# Patient Record
Sex: Female | Born: 1935 | Race: Black or African American | Hispanic: No | Smoking: Never smoker
Health system: Southern US, Community
[De-identification: ages and names within clinical notes are randomized; demographics above are authoritative.]

## PROBLEM LIST (undated history)

## (undated) ENCOUNTER — Emergency Department (HOSPITAL_COMMUNITY): Admission: EM | Payer: Medicare HMO | Source: Home / Self Care

## (undated) DIAGNOSIS — D472 Monoclonal gammopathy: Secondary | ICD-10-CM

## (undated) DIAGNOSIS — E039 Hypothyroidism, unspecified: Secondary | ICD-10-CM

## (undated) DIAGNOSIS — R51 Headache: Secondary | ICD-10-CM

## (undated) DIAGNOSIS — I10 Essential (primary) hypertension: Secondary | ICD-10-CM

## (undated) HISTORY — PX: OTHER SURGICAL HISTORY: SHX169

## (undated) HISTORY — PX: ABDOMINAL HYSTERECTOMY: SHX81

## (undated) HISTORY — DX: Headache: R51

## (undated) HISTORY — DX: Monoclonal gammopathy: D47.2

---

## 1998-08-06 ENCOUNTER — Ambulatory Visit (HOSPITAL_COMMUNITY): Admission: RE | Admit: 1998-08-06 | Discharge: 1998-08-06 | Payer: Self-pay | Admitting: Obstetrics and Gynecology

## 1999-08-13 ENCOUNTER — Encounter: Payer: Self-pay | Admitting: Internal Medicine

## 1999-08-13 ENCOUNTER — Ambulatory Visit (HOSPITAL_COMMUNITY): Admission: RE | Admit: 1999-08-13 | Discharge: 1999-08-13 | Payer: Self-pay | Admitting: Internal Medicine

## 2000-08-16 ENCOUNTER — Encounter: Payer: Self-pay | Admitting: Obstetrics and Gynecology

## 2000-08-16 ENCOUNTER — Ambulatory Visit (HOSPITAL_COMMUNITY): Admission: RE | Admit: 2000-08-16 | Discharge: 2000-08-16 | Payer: Self-pay | Admitting: Obstetrics and Gynecology

## 2000-11-21 ENCOUNTER — Other Ambulatory Visit: Admission: RE | Admit: 2000-11-21 | Discharge: 2000-11-21 | Payer: Self-pay | Admitting: Obstetrics and Gynecology

## 2000-11-21 ENCOUNTER — Encounter: Payer: Self-pay | Admitting: Obstetrics and Gynecology

## 2000-11-21 ENCOUNTER — Encounter: Admission: RE | Admit: 2000-11-21 | Discharge: 2000-11-21 | Payer: Self-pay | Admitting: Obstetrics and Gynecology

## 2001-05-16 ENCOUNTER — Emergency Department (HOSPITAL_COMMUNITY): Admission: EM | Admit: 2001-05-16 | Discharge: 2001-05-16 | Payer: Self-pay | Admitting: Emergency Medicine

## 2002-03-05 ENCOUNTER — Encounter: Admission: RE | Admit: 2002-03-05 | Discharge: 2002-03-05 | Payer: Self-pay | Admitting: Obstetrics and Gynecology

## 2002-03-05 ENCOUNTER — Encounter: Payer: Self-pay | Admitting: Obstetrics and Gynecology

## 2002-07-24 ENCOUNTER — Emergency Department (HOSPITAL_COMMUNITY): Admission: EM | Admit: 2002-07-24 | Discharge: 2002-07-24 | Payer: Self-pay | Admitting: Emergency Medicine

## 2003-02-05 ENCOUNTER — Observation Stay (HOSPITAL_COMMUNITY): Admission: RE | Admit: 2003-02-05 | Discharge: 2003-02-06 | Payer: Self-pay | Admitting: Obstetrics and Gynecology

## 2005-07-06 ENCOUNTER — Ambulatory Visit (HOSPITAL_COMMUNITY): Admission: RE | Admit: 2005-07-06 | Discharge: 2005-07-06 | Payer: Self-pay | Admitting: Internal Medicine

## 2005-08-19 ENCOUNTER — Ambulatory Visit (HOSPITAL_COMMUNITY): Admission: RE | Admit: 2005-08-19 | Discharge: 2005-08-19 | Payer: Self-pay | Admitting: Obstetrics and Gynecology

## 2006-10-13 ENCOUNTER — Ambulatory Visit (HOSPITAL_COMMUNITY): Admission: RE | Admit: 2006-10-13 | Discharge: 2006-10-13 | Payer: Self-pay | Admitting: Internal Medicine

## 2007-01-17 ENCOUNTER — Ambulatory Visit (HOSPITAL_COMMUNITY): Admission: RE | Admit: 2007-01-17 | Discharge: 2007-01-17 | Payer: Self-pay | Admitting: Internal Medicine

## 2007-10-16 ENCOUNTER — Ambulatory Visit (HOSPITAL_COMMUNITY): Admission: RE | Admit: 2007-10-16 | Discharge: 2007-10-16 | Payer: Self-pay | Admitting: Obstetrics and Gynecology

## 2007-10-22 ENCOUNTER — Ambulatory Visit (HOSPITAL_COMMUNITY): Admission: RE | Admit: 2007-10-22 | Discharge: 2007-10-22 | Payer: Self-pay | Admitting: Internal Medicine

## 2008-10-16 ENCOUNTER — Ambulatory Visit (HOSPITAL_COMMUNITY): Admission: RE | Admit: 2008-10-16 | Discharge: 2008-10-16 | Payer: Self-pay | Admitting: Obstetrics and Gynecology

## 2010-04-16 ENCOUNTER — Ambulatory Visit: Payer: Self-pay | Admitting: Internal Medicine

## 2010-04-16 ENCOUNTER — Observation Stay (HOSPITAL_COMMUNITY): Admission: EM | Admit: 2010-04-16 | Discharge: 2010-04-18 | Payer: Self-pay | Admitting: Emergency Medicine

## 2010-04-17 ENCOUNTER — Encounter (INDEPENDENT_AMBULATORY_CARE_PROVIDER_SITE_OTHER): Payer: Self-pay | Admitting: Internal Medicine

## 2010-11-07 ENCOUNTER — Encounter: Payer: Self-pay | Admitting: Internal Medicine

## 2010-11-12 ENCOUNTER — Other Ambulatory Visit: Payer: Self-pay | Admitting: Obstetrics

## 2011-01-02 LAB — URINALYSIS, ROUTINE W REFLEX MICROSCOPIC
Bilirubin Urine: NEGATIVE
Ketones, ur: NEGATIVE mg/dL
Nitrite: NEGATIVE
Protein, ur: NEGATIVE mg/dL
Specific Gravity, Urine: 1.022 (ref 1.005–1.030)
Urobilinogen, UA: 0.2 mg/dL (ref 0.0–1.0)
pH: 6 (ref 5.0–8.0)

## 2011-01-02 LAB — POCT CARDIAC MARKERS
CKMB, poc: <1 ng/mL — ABNORMAL LOW (ref 1.0–8.0)
Myoglobin, poc: 76.8 ng/mL (ref 12–200)

## 2011-01-02 LAB — COMPREHENSIVE METABOLIC PANEL
AST: 19 U/L (ref 0–37)
Alkaline Phosphatase: 49 U/L (ref 39–117)
Chloride: 109 mEq/L (ref 96–112)
GFR calc non Af Amer: >60 mL/min (ref >60)
Glucose, Bld: 79 mg/dL (ref 70–99)

## 2011-01-02 LAB — CK TOTAL AND CKMB (NOT AT ARMC): Relative Index: 0.9 (ref 0.0–2.5)

## 2011-01-02 LAB — GLUCOSE, CAPILLARY
Glucose-Capillary: 87 mg/dL (ref 70–99)
Glucose-Capillary: 95 mg/dL (ref 70–99)

## 2011-01-02 LAB — BASIC METABOLIC PANEL
Calcium: 8.1 mg/dL — ABNORMAL LOW (ref 8.4–10.5)
Creatinine, Ser: 0.78 mg/dL (ref 0.4–1.2)
GFR calc Af Amer: >60 mL/min (ref >60)
GFR calc non Af Amer: >60 mL/min (ref >60)
Sodium: 141 mEq/L (ref 135–145)

## 2011-01-02 LAB — CBC
HCT: 35.7 % — ABNORMAL LOW (ref 36.0–46.0)
Hemoglobin: 11.9 g/dL — ABNORMAL LOW (ref 12.0–15.0)
MCHC: 33.3 g/dL (ref 30.0–36.0)
RBC: 4.05 MIL/uL (ref 3.87–5.11)
WBC: 5.1 10*3/uL (ref 4.0–10.5)

## 2011-01-02 LAB — LIPID PANEL
Cholesterol: 155 mg/dL (ref 0–200)
LDL Cholesterol: 97 mg/dL (ref 0–99)
VLDL: 20 mg/dL (ref 0–40)

## 2011-01-02 LAB — DIFFERENTIAL
Monocytes Absolute: 0.4 10*3/uL (ref 0.1–1.0)
Neutro Abs: 1.9 10*3/uL (ref 1.7–7.7)
Neutrophils Relative %: 38 % — ABNORMAL LOW (ref 43–77)

## 2011-01-02 LAB — HEMOGLOBIN A1C: Mean Plasma Glucose: 131 mg/dL — ABNORMAL HIGH (ref <117)

## 2011-01-02 LAB — POCT I-STAT, CHEM 8
Calcium, Ion: 1.06 mmol/L — ABNORMAL LOW (ref 1.12–1.32)
HCT: 38 % (ref 36.0–46.0)

## 2011-01-02 LAB — URINE MICROSCOPIC-ADD ON

## 2011-01-02 LAB — TROPONIN I: Troponin I: 0.03 ng/mL (ref 0.00–0.06)

## 2011-03-04 NOTE — Op Note (Signed)
Brianna [name], Brianna [name]                       ACCOUNT NO.:  [id number]   MEDICAL RECORD NO.:  [id number]                   PATIENT TYPE:  OBV   LOCATION:  9143                                 FACILITY:  [facility]   PHYSICIAN:  Sherry A. Rosalio Macadamia, M.D.           DATE OF BIRTH:  20-Mar-1933   DATE OF PROCEDURE:  02/05/2003  DATE OF DISCHARGE:                                 OPERATIVE REPORT   PREOPERATIVE DIAGNOSIS:  Cystocele.   POSTOPERATIVE DIAGNOSIS:  Cystocele.   PROCEDURE:  1. Anterior repair.  2. Perineorrhaphy.   SURGEON:  Sherry A. Rosalio Macadamia, M.D., [place] Holstein. Earlene Plater, M.D.   ANESTHESIA:  Spinal.   INDICATIONS:  This is a 77 year old G5, P3-0-2-3 woman who has had a  cystocele present for many years.  It has been getting progressively worse.  At this time the tissues remain outside the vagina at all times.  The  patient has to push the tissues back into the vagina in order to urinate;  however, it drops again immediately thereafter.  Because of the symptoms  patient requests surgical repair.   FINDINGS:  Fourth degree cystocele, gaping introitus.   PROCEDURE:  The patient was brought into the operating room, given adequate  spinal anesthesia.  She was placed in a dorsal lithotomy position.  Her  perineum and vagina were washed with Hibiclens.  The patient was draped in a  sterile fashion.  A weighted speculum was placed in the vagina.  Apex of the  vagina was identified and grasped with Allis clamps.  The vaginal mucosa was  incised horizontally.  The vaginal mucosa was dissected off of the bladder  with blunt and sharp dissection.  This was done to the UV junction.  The  vaginal tissues were then dissected off of the bladder laterally.  There was  good dissection beneath the urethrovesical junction.  Using 0 Vicryl in  mattress type suture support stitch was taken at the UV junction x2.  Small  bleeders were cauterized and one bleeder was ligated with 2-0 chromic in a  figure-of-eight stitch.  Once adequate hemostasis was felt to be present,  Kelly plication stitches were taken with 2-0 chromic across the bladder by  identifying the endopelvic fascia.  Once the bladder was reduced in this  fashion the excess vaginal tissue was excised.  The vaginal incision was  then closed with 2-0 chromic in a running locked stitch.   Perineorrhaphy was performed by placing Allis clamps approximately 2 cm  apart on the introitus.  The perineal tissue was incised and the epithelium  was excised in a triangular fashion.  The vaginal mucosa was dissected off  of the perineal tissue and the beginning of the rectum with blunt and sharp  dissection.  Dissection was performed laterally bluntly and sharply.  0  Vicryl mattress type stitches were placed x2 in the levator ani muscles for  perineal support.  Excess vaginal tissue was  excised.  Vaginal mucosa was  then closed with 3-0 chromic in a running locked stitch to the perineum.  Perineal support was also given with a 2-0 chromic subcutaneous stitch.  The  perineal skin was then closed with 3-0 chromic in a subcuticular running  stitch.  Adequate hemostasis was present.  A small pack was placed within  the vagina with Estrace cream on it as a means to place the cream well  within the vagina.  During the surgery before the Kelly plication stitches a  Foley catheter was placed to drain the bladder.  The decision was made to  leave the Foley in place and no suprapubic catheter was necessary.  All  instruments had been removed from the vagina.  The patient was taken out of  the dorsal lithotomy position.  She was awakened.  She was moved from the  operating table to a stretcher in stable condition.   COMPLICATIONS:  None.   ESTIMATED BLOOD LOSS:  50 mL.   URINE OUTPUT:  775 mL.                                               Sherry A. Rosalio Macadamia, M.D.    SAD/MEDQ  D:  02/05/2003  T:  02/05/2003  Job:  [id number]

## 2012-05-11 ENCOUNTER — Emergency Department (HOSPITAL_COMMUNITY): Payer: Medicare HMO

## 2012-05-11 ENCOUNTER — Emergency Department (HOSPITAL_COMMUNITY)
Admission: EM | Admit: 2012-05-11 | Discharge: 2012-05-12 | Disposition: A | Discharge status: Home / Self Care | Payer: Medicare HMO | Attending: Emergency Medicine | Admitting: Emergency Medicine

## 2012-05-11 ENCOUNTER — Encounter (HOSPITAL_COMMUNITY): Payer: Self-pay | Admitting: Emergency Medicine

## 2012-05-11 DIAGNOSIS — I10 Essential (primary) hypertension: Secondary | ICD-10-CM | POA: Insufficient documentation

## 2012-05-11 DIAGNOSIS — I6509 Occlusion and stenosis of unspecified vertebral artery: Secondary | ICD-10-CM | POA: Insufficient documentation

## 2012-05-11 DIAGNOSIS — Z79899 Other long term (current) drug therapy: Secondary | ICD-10-CM | POA: Insufficient documentation

## 2012-05-11 DIAGNOSIS — E039 Hypothyroidism, unspecified: Secondary | ICD-10-CM | POA: Insufficient documentation

## 2012-05-11 DIAGNOSIS — R42 Dizziness and giddiness: Secondary | ICD-10-CM

## 2012-05-11 DIAGNOSIS — I6789 Other cerebrovascular disease: Secondary | ICD-10-CM | POA: Insufficient documentation

## 2012-05-11 DIAGNOSIS — R51 Headache: Secondary | ICD-10-CM | POA: Insufficient documentation

## 2012-05-11 HISTORY — DX: Essential (primary) hypertension: I10

## 2012-05-11 HISTORY — DX: Hypothyroidism, unspecified: E03.9

## 2012-05-11 HISTORY — DX: Headache: R51

## 2012-05-11 LAB — COMPREHENSIVE METABOLIC PANEL
ALT: 15 U/L (ref 0–35)
AST: 20 U/L (ref 0–37)
Albumin: 3.8 g/dL (ref 3.5–5.2)
Alkaline Phosphatase: 54 U/L (ref 39–117)
BUN: 23 mg/dL (ref 6–23)
Chloride: 104 mEq/L (ref 96–112)
Potassium: 4.1 mEq/L (ref 3.5–5.1)
Sodium: 141 mEq/L (ref 135–145)
Total Bilirubin: 0.2 mg/dL — ABNORMAL LOW (ref 0.3–1.2)
Total Protein: 8.1 g/dL (ref 6.0–8.3)

## 2012-05-11 LAB — CBC WITH DIFFERENTIAL/PLATELET
Basophils Relative: 0 % (ref 0–1)
Eosinophils Absolute: 0 10*3/uL (ref 0.0–0.7)
Hemoglobin: 13.5 g/dL (ref 12.0–15.0)
MCHC: 34.1 g/dL (ref 30.0–36.0)
Monocytes Relative: 7 % (ref 3–12)
Neutro Abs: 2.7 10*3/uL (ref 1.7–7.7)
Neutrophils Relative %: 42 % — ABNORMAL LOW (ref 43–77)
Platelets: 162 10*3/uL (ref 150–400)
RBC: 4.43 MIL/uL (ref 3.87–5.11)

## 2012-05-11 MED ORDER — DIAZEPAM 5 MG PO TABS: 5.0000 mg | ORAL_TABLET | Freq: Once | ORAL | Status: DC

## 2012-05-11 MED ORDER — MECLIZINE HCL 25 MG PO TABS
25.0000 mg | ORAL_TABLET | Freq: Once | ORAL | Status: AC
  Administered 2012-05-12: 25 mg via ORAL
  Filled 2012-05-11: qty 1

## 2012-05-11 NOTE — ED Notes (Signed)
C/o headache and dizziness since 7/14.  States today she started having pain in L arm.  No known injury.

## 2012-05-11 NOTE — ED Notes (Signed)
Pt aware of the urine sample needed, unable to use the bathroom at this time 

## 2012-05-11 NOTE — ED Notes (Signed)
Patient presents with c/o feeling dizzy for the last 2-3 weeks.  Has been seen by the headache specialist and started on Gabapentin as in the past.  States her head hurts up the back of her neck and head and she feels like she is not walking a straight line.  Denies nausea.

## 2012-05-12 ENCOUNTER — Other Ambulatory Visit (HOSPITAL_COMMUNITY): Payer: Self-pay | Admitting: Emergency Medicine

## 2012-05-12 ENCOUNTER — Emergency Department (HOSPITAL_COMMUNITY): Payer: Medicare HMO

## 2012-05-12 DIAGNOSIS — R51 Headache: Secondary | ICD-10-CM

## 2012-05-12 LAB — URINALYSIS, ROUTINE W REFLEX MICROSCOPIC
Glucose, UA: NEGATIVE mg/dL
Ketones, ur: NEGATIVE mg/dL
Nitrite: NEGATIVE
Specific Gravity, Urine: 1.011 (ref 1.005–1.030)
pH: 6.5 (ref 5.0–8.0)

## 2012-05-12 LAB — CARDIAC PANEL(CRET KIN+CKTOT+MB+TROPI)
Relative Index: 1.1 (ref 0.0–2.5)
Total CK: 149 U/L (ref 7–177)

## 2012-05-12 LAB — SEDIMENTATION RATE: Sed Rate: 82 mm/hr — ABNORMAL HIGH (ref 0–22)

## 2012-05-12 LAB — URINE MICROSCOPIC-ADD ON

## 2012-05-12 MED ORDER — GADOBENATE DIMEGLUMINE 529 MG/ML IV SOLN
15.0000 mL | Freq: Once | INTRAVENOUS | Status: AC
  Administered 2012-05-12: 15 mL via INTRAVENOUS

## 2012-05-12 MED ORDER — PREDNISONE 20 MG PO TABS: 60.0000 mg | ORAL_TABLET | Freq: Once | ORAL | Status: DC

## 2012-05-12 MED ORDER — MECLIZINE HCL 12.5 MG PO TABS: 12.5000 mg | ORAL_TABLET | Freq: Three times a day (TID) | ORAL | Status: AC | PRN

## 2012-05-12 NOTE — ED Notes (Signed)
Pt ambulatory to restroom without assistance and able to tolerate PO fluids.

## 2012-05-12 NOTE — ED Notes (Signed)
Pt returned from MR  

## 2012-05-12 NOTE — ED Notes (Signed)
MD at bedside. 

## 2012-05-12 NOTE — ED Notes (Signed)
Valium held due to patient driving herself here.  Dr. Manus Gunning notified

## 2012-05-12 NOTE — ED Notes (Signed)
Report to Chad

## 2012-05-12 NOTE — Discharge Instructions (Signed)
Dizziness Your testing today does not show any evidence of heart attack or stroke. Follow up with your doctor. Return to the ED if you develop new or worsening symptoms. Dizziness is a common problem. It is a feeling of unsteadiness or lightheadedness. You may feel like you are about to faint. Dizziness can lead to injury if you stumble or fall. A person of any age group can suffer from dizziness, but dizziness is more common in older adults. CAUSES  Dizziness can be caused by many different things, including:  Middle ear problems.   Standing for too long.   Infections.   An allergic reaction.   Aging.   An emotional response to something, such as the sight of blood.   Side effects of medicines.   Fatigue.   Problems with circulation or blood pressure.   Excess use of alcohol, medicines, or illegal drug use.   Breathing too fast (hyperventilation).   An arrhythmia or problems with your heart rhythm.   Low red blood cell count (anemia).   Pregnancy.   Vomiting, diarrhea, fever, or other illnesses that cause dehydration.   Diseases or conditions such as Parkinson's disease, high blood pressure (hypertension), diabetes, and thyroid problems.   Exposure to extreme heat.  DIAGNOSIS  To find the cause of your dizziness, your caregiver may do a physical exam, lab tests, radiologic imaging scans, or an electrocardiography test (ECG).  TREATMENT  Treatment of dizziness depends on the cause of your symptoms and can vary greatly. HOME CARE INSTRUCTIONS   Drink enough fluids to keep your urine clear or pale yellow. This is especially important in very hot weather. In the elderly, it is also important in cold weather.   If your dizziness is caused by medicines, take them exactly as directed. When taking blood pressure medicines, it is especially important to get up slowly.   Rise slowly from chairs and steady yourself until you feel okay.   In the morning, first sit up on the  side of the bed. When this seems okay, stand slowly while holding onto something until you know your balance is fine.   If you need to stand in one place for a long time, be sure to move your legs often. Tighten and relax the muscles in your legs while standing.   If dizziness continues to be a problem, have someone stay with you for a day or two. Do this until you feel you are well enough to stay alone. Have the person call your caregiver if he or she notices changes in you that are concerning.   Do not drive or use heavy machinery if you feel dizzy.  SEEK IMMEDIATE MEDICAL CARE IF:   Your dizziness or lightheadedness gets worse.   You feel nauseous or vomit.   You develop problems with talking, walking, weakness, or using your arms, hands, or legs.   You are not thinking clearly or you have difficulty forming sentences. It may take a friend or family member to determine if your thinking is normal.   You develop chest pain, abdominal pain, shortness of breath, or sweating.   Your vision changes.   You notice any bleeding.   You have side effects from medicine that seems to be getting worse rather than better.  MAKE SURE YOU:   Understand these instructions.   Will watch your condition.   Will get help right away if you are not doing well or get worse.  Document Released: 03/29/2001 Document Revised: 09/22/2011  Document Reviewed: 04/22/2011 [facility] Patient Information 2012 [place], [place].

## 2012-05-12 NOTE — ED Provider Notes (Addendum)
History     CSN: [id number]  Arrival date & time 05/11/12  1919   First MD Initiated Contact with Patient 05/11/12 2318      Chief Complaint  Patient presents with  . Headache  . Dizziness    (Consider location/radiation/quality/duration/timing/severity/associated sxs/prior treatment) HPI Comments:  Patient presents with intermittent headaches, dizziness, ataxia since July 14. She seen a headache specialist, Dr. Neale Burly was given her gabapentin for the headache. She came tonight to her dizziness and ataxia is worse than usual. It is worse with standing worse with walking. She denies vertigo or lightheadedness but she feels unbalanced and is going to fall over. Denies chest pain or shortness of breath. Denies any visual change. She denies any focal weakness, numbness or tingling.  Patient does not initially mention L arm pain mentioned in triage note.  When asked, she states she had some soreness around her L elbow earlier today that lasted several hours and is now resolved.  Admission for similar symptoms 2 years ago with negative stroke workup.  The history is provided by the patient.    Past Medical History  Diagnosis Date  . Headache   . Hypothyroid   . Hypertension     Past Surgical History  Procedure Date  . Abdominal hysterectomy     No family history on file.  History  Substance Use Topics  . Smoking status: Never Smoker   . Smokeless tobacco: Not on file  . Alcohol Use: Yes    OB History    Grav Para Term Preterm Abortions TAB SAB Ect Mult Living                  Review of Systems  Constitutional: Negative for fever, activity change and appetite change.  HENT: Negative for congestion and rhinorrhea.   Respiratory: Negative for cough, chest tightness and shortness of breath.   Cardiovascular: Negative for chest pain.  Gastrointestinal: Negative for nausea, vomiting and abdominal pain.  Genitourinary: Negative for dysuria and hematuria.  Musculoskeletal:  Positive for gait problem.  Neurological: Positive for headaches.    Allergies  Iodine and Sulfa antibiotics  Home Medications   Current Outpatient Rx  Name Route Sig Dispense Refill  . AMLODIPINE BESYLATE 5 MG PO TABS Oral Take 5 mg by mouth daily.    [name] BLACK COHOSH PO Oral Take 1 tablet by mouth daily.    [name] VITAMIN D3 1000 UNITS PO CAPS Oral Take 1 capsule by mouth daily.    [name] CLOBETASOL PROPIONATE 0.05 % EX OINT Topical Apply 1 application topically 2 (two) times a week.    . CO Q 10 PO Oral Take 1 tablet by mouth daily.    [name] CRANBERRY PO Oral Take 1 capsule by mouth daily.    . DESONIDE 0.05 % EX CREA Topical Apply 1 application topically at bedtime.    . OMEGA-3 FATTY ACIDS 1000 MG PO CAPS Oral Take 1 g by mouth daily.    [name] GABAPENTIN 600 MG PO TABS Oral Take 600 mg by mouth daily.    [name] LEVOTHYROXINE SODIUM 125 MCG PO TABS Oral Take 125 mcg by mouth daily.    . ADULT MULTIVITAMIN W/MINERALS CH Oral Take 0.5 tablets by mouth 2 (two) times daily.    Frazier Butt OP Ophthalmic Apply 1-2 drops to eye daily as needed. For dry eyes    . MECLIZINE HCL 12.5 MG PO TABS Oral Take 1 tablet (12.5 mg total) by mouth 3 (three) times daily as  needed. 30 tablet 0    BP 141/70  Pulse 68  Temp 98.4 F (36.9 C) (Oral)  Resp 16  SpO2 95%  Physical Exam  Constitutional: She is oriented to person, place, and time. She appears well-developed and well-nourished. No distress.  HENT:  Head: Normocephalic and atraumatic.  Mouth/Throat: Oropharynx is clear and moist. No oropharyngeal exudate.       No temporal artery tenderness  Eyes: Conjunctivae and EOM are normal. Pupils are equal, round, and reactive to light.  Neck: Normal range of motion. Neck supple.  Cardiovascular: Normal rate, regular rhythm and normal heart sounds.   No murmur heard. Pulmonary/Chest: Effort normal and breath sounds normal. No respiratory distress.  Abdominal: Soft. There is no tenderness. There is no rebound and no  guarding.  Musculoskeletal: Normal range of motion. She exhibits no edema and no tenderness.       FROM L elbow without effusion or bony tenderness  Neurological: She is alert and oriented to person, place, and time. No cranial nerve deficit.       Cranial nerves III through XII intact, no nystagmus, visual fields full to confrontation, no pronator drift, equal grip strength, 5 out of 5 strength throughout, positive Romberg with ataxic gait. Some ataxia on finger to nose and left  Skin: Skin is warm.    ED Course  Procedures (including critical care time)  Labs Reviewed  CBC WITH DIFFERENTIAL - Abnormal; Notable for the following:    Neutrophils Relative 42 (*)     Lymphocytes Relative 50 (*)     All other components within normal limits  COMPREHENSIVE METABOLIC PANEL - Abnormal; Notable for the following:    Total Bilirubin 0.2 (*)     GFR calc non Af Amer 64 (*)     GFR calc Af Amer 75 (*)     All other components within normal limits  URINALYSIS, ROUTINE W REFLEX MICROSCOPIC - Abnormal; Notable for the following:    APPearance CLOUDY (*)     Hgb urine dipstick SMALL (*)     Leukocytes, UA LARGE (*)     All other components within normal limits  SEDIMENTATION RATE - Abnormal; Notable for the following:    Sed Rate 82 (*)     All other components within normal limits  URINE MICROSCOPIC-ADD ON - Abnormal; Notable for the following:    Squamous Epithelial / LPF MANY (*)     All other components within normal limits  CARDIAC PANEL(CRET KIN+CKTOT+MB+TROPI)  C-REACTIVE PROTEIN   Ct Head Wo Contrast  05/11/2012  *RADIOLOGY REPORT*  Clinical Data: Headache and dizziness  CT HEAD WITHOUT CONTRAST  Technique:  Contiguous axial images were obtained from the base of the skull through the vertex without contrast.  Comparison: 04/16/2010  Findings: Chronic ischemic changes in the anterior limb of the right internal capsule.  No mass effect, midline shift, or acute intracranial hemorrhage.   Mastoid air cells and visualized paranasal sinuses are clear.  Cranium is intact.  IMPRESSION: No acute intracranial pathology.  Original Report Authenticated By: Donavan Burnet, M.D.     1. Dizziness       MDM  Intermittent headache, dizziness, ataxia for the past 2 weeks. No focal neurological deficits besides positive Romberg with ataxic gait. Resolved L forearm pain, atypical for ACS. Admission for similar symptoms 2 years ago with negative stroke workup.  CT head negative.  Given intermittent dizziness, ataxia, vertigo, concern for posterior circulation infarct and will obtain MRI to  rule out CVA. Symptoms improved with meclizine.  Ambulatory without assistance or ataxia.  ESR elevated but no temporal artery tenderness, headache, or visual change to suggest temporal arteritis.  MRI negative for CVA.  No significant carotid or vertebral stenosis.  Stable for outpatient followup.   Date: 05/12/2012  Rate: 72  Rhythm: normal sinus rhythm  QRS Axis: normal  Intervals: normal  ST/T Wave abnormalities: nonspecific ST/T changes  Conduction Disutrbances:none  Narrative Interpretation: Possible inferior T wave inversions, will repeat  Old EKG Reviewed: changes noted   Date: 05/12/2012 0046  Rate: 61  Rhythm: normal sinus rhythm  QRS Axis: normal  Intervals: normal  ST/T Wave abnormalities: normal  Conduction Disutrbances:none  Narrative Interpretation: T waves unchanged from previous and normal  Old EKG Reviewed: unchanged        Glynn Octave, MD 05/12/12 7829  Glynn Octave, MD 05/12/12 [phone]

## 2012-08-06 ENCOUNTER — Other Ambulatory Visit: Payer: Self-pay | Admitting: Ophthalmology

## 2012-08-06 NOTE — H&P (Addendum)
Pre-operative History and Physical for Ophthalmic Surgery  Brianna [name] 08/06/2012                  Chief Complaint: Decreased vision  Diagnosis: Cataract Left Eye  Allergies  Allergen Reactions  . Iodine Other (See Comments)    Reaction unknown  . Sulfa Antibiotics Other (See Comments)    Childhood reaction    Prior to Admission medications   Medication Sig Start Date End Date Taking? Authorizing Provider  amLODipine (NORVASC) 5 MG tablet Take 5 mg by mouth daily.    Historical Provider, MD  BLACK COHOSH PO Take 1 tablet by mouth daily.    Historical Provider, MD  Cholecalciferol (VITAMIN D3) 1000 UNITS CAPS Take 1 capsule by mouth daily.    Historical Provider, MD  clobetasol ointment (TEMOVATE) 0.05 % Apply 1 application topically 2 (two) times a week.    Historical Provider, MD  Coenzyme Q10 (CO Q 10 PO) Take 1 tablet by mouth daily.    Historical Provider, MD  CRANBERRY PO Take 1 capsule by mouth daily.    Historical Provider, MD  desonide (DESOWEN) 0.05 % cream Apply 1 application topically at bedtime.    Historical Provider, MD  fish oil-omega-3 fatty acids 1000 MG capsule Take 1 g by mouth daily.    Historical Provider, MD  gabapentin (NEURONTIN) 600 MG tablet Take 600 mg by mouth daily.    Historical Provider, MD  levothyroxine (SYNTHROID, LEVOTHROID) 125 MCG tablet Take 125 mcg by mouth daily.    Historical Provider, MD  Multiple Vitamin (MULTIVITAMIN WITH MINERALS) TABS Take 0.5 tablets by mouth 2 (two) times daily.    Historical Provider, MD  Polyethyl Glycol-Propyl Glycol (SYSTANE OP) Apply 1-2 drops to eye daily as needed. For dry eyes    Historical Provider, MD   Planned Procedure:                                       Phacoemulsification, Posterior Chamber Intra-ocular Lens Left Eye                                       Acrysof MA50BM  + 19. 00 Diopter for implant OS  There were no vitals filed for this visit.  Pulse: 70         Temp: NE        Resp:  18        ROS: non-contributory  Past Medical History  Diagnosis Date  . Headache   . Hypothyroid   . Hypertension     Past Surgical History  Procedure Date  . Abdominal hysterectomy      History   Social History  . Marital Status: Married    Spouse Name: N/A    Number of Children: N/A  . Years of Education: N/A   Occupational History  . Not on file.   Social History Main Topics  . Smoking status: Never Smoker   . Smokeless tobacco: Not on file  . Alcohol Use: Yes  . Drug Use: No  . Sexually Active:    Other Topics Concern  . Not on file   Social History Narrative  . No narrative on file     The following examination is for anesthesia clearance for minimally invasive Ophthalmic surgery. It is primarily   to document heart and lung findings and is not intended to elucidate unknown general medical conditions inclusive of abdominal masses, lung lesions, etc.   General Constitution:  within normal limits    Alertness/Orientation:  Person, time place     yes   HEENT:  Eye Findings: Cataract Left Eye                   left eye  Neck: supple without masses  Chest/Lungs: clear to auscultation  Cardiac: Normal S1 and S2 without Murmur, S3 or S4  Neuro: non-focal   Impression:  Combined Cataract  Planned Procedure:  Phacoemulsification, Posterior Chamber Intraocular Lens Left Eye   [name], MD        

## 2012-08-07 ENCOUNTER — Encounter (HOSPITAL_COMMUNITY): Payer: Self-pay | Admitting: Pharmacy Technician

## 2012-08-15 NOTE — Pre-Procedure Instructions (Signed)
20 ZALMA CHANNING  08/15/2012   Your procedure is scheduled on:  Wednesday November 6  Report to [facility] Stay [facility] at 11:30 AM.  Call this number if you have problems the morning of surgery: [phone]   Remember:   Do not eat or drink:After Midnight.    Take these medicines the morning of surgery with A SIP OF WATER: Amlodipine, levothyroxine. Meclizine if needed. Eye drops.     Do not wear jewelry, make-up or nail polish.  Do not wear lotions, powders, or perfumes. You may wear deodorant.  Do not shave 48 hours prior to surgery. Men may shave face and neck.  Do not bring valuables to the hospital.  Contacts, dentures or bridgework may not be worn into surgery.  Leave suitcase in the car. After surgery it may be brought to your room.  For patients admitted to the hospital, checkout time is 11:00 AM the day of discharge.   Patients discharged the day of surgery will not be allowed to drive home.    Special Instructions: Shower using CHG 2 nights before surgery and the night before surgery.  If you shower the day of surgery use CHG.  Use special wash - you have one bottle of CHG for all showers.  You should use approximately 1/3 of the bottle for each shower.   Please read over the following fact sheets that you were given: Pain Booklet, Coughing and Deep Breathing and Surgical Site Infection Prevention

## 2012-08-16 ENCOUNTER — Encounter (HOSPITAL_COMMUNITY)
Admission: RE | Admit: 2012-08-16 | Discharge: 2012-08-16 | Disposition: A | Discharge status: Home / Self Care | Payer: Medicare HMO | Source: Ambulatory Visit | Attending: Ophthalmology | Admitting: Ophthalmology

## 2012-08-16 ENCOUNTER — Encounter (HOSPITAL_COMMUNITY): Payer: Self-pay

## 2012-08-16 LAB — CBC
Hemoglobin: 12.6 g/dL (ref 12.0–15.0)
RBC: 4.21 MIL/uL (ref 3.87–5.11)
WBC: 4.7 10*3/uL (ref 4.0–10.5)

## 2012-08-16 LAB — BASIC METABOLIC PANEL
Chloride: 101 mEq/L (ref 96–112)
GFR calc Af Amer: 66 mL/min — ABNORMAL LOW (ref >90)
GFR calc non Af Amer: 57 mL/min — ABNORMAL LOW (ref >90)
Potassium: 3.4 mEq/L — ABNORMAL LOW (ref 3.5–5.1)
Sodium: 138 mEq/L (ref 135–145)

## 2012-08-21 MED ORDER — TETRACAINE HCL 0.5 % OP SOLN
1.0000 [drp] | OPHTHALMIC | Status: DC
  Filled 2012-08-21: qty 2

## 2012-08-21 MED ORDER — GATIFLOXACIN 0.5 % OP SOLN
1.0000 [drp] | OPHTHALMIC | Status: DC | PRN
  Filled 2012-08-21: qty 2.5

## 2012-08-21 MED ORDER — PREDNISOLONE ACETATE 1 % OP SUSP
1.0000 [drp] | OPHTHALMIC | Status: DC
  Filled 2012-08-21: qty 5

## 2012-08-21 MED ORDER — PHENYLEPHRINE HCL 2.5 % OP SOLN
1.0000 [drp] | OPHTHALMIC | Status: DC | PRN
  Filled 2012-08-21: qty 3

## 2012-08-22 ENCOUNTER — Encounter (HOSPITAL_COMMUNITY): Payer: Self-pay | Admitting: Certified Registered"

## 2012-08-22 ENCOUNTER — Ambulatory Visit (HOSPITAL_COMMUNITY)
Admission: RE | Admit: 2012-08-22 | Discharge: 2012-08-22 | Disposition: A | Discharge status: Home / Self Care | Payer: Medicare HMO | Source: Ambulatory Visit | Attending: Ophthalmology | Admitting: Ophthalmology

## 2012-08-22 ENCOUNTER — Ambulatory Visit (HOSPITAL_COMMUNITY): Payer: Medicare HMO | Admitting: Certified Registered"

## 2012-08-22 ENCOUNTER — Encounter (HOSPITAL_COMMUNITY): Payer: Self-pay

## 2012-08-22 ENCOUNTER — Encounter (HOSPITAL_COMMUNITY)
Admission: RE | Disposition: A | Discharge status: Home / Self Care | Payer: Self-pay | Source: Ambulatory Visit | Attending: Ophthalmology

## 2012-08-22 DIAGNOSIS — Z01818 Encounter for other preprocedural examination: Secondary | ICD-10-CM | POA: Insufficient documentation

## 2012-08-22 DIAGNOSIS — Z79899 Other long term (current) drug therapy: Secondary | ICD-10-CM | POA: Insufficient documentation

## 2012-08-22 DIAGNOSIS — H2510 Age-related nuclear cataract, unspecified eye: Secondary | ICD-10-CM | POA: Insufficient documentation

## 2012-08-22 DIAGNOSIS — E039 Hypothyroidism, unspecified: Secondary | ICD-10-CM | POA: Insufficient documentation

## 2012-08-22 DIAGNOSIS — Z01812 Encounter for preprocedural laboratory examination: Secondary | ICD-10-CM | POA: Insufficient documentation

## 2012-08-22 DIAGNOSIS — I10 Essential (primary) hypertension: Secondary | ICD-10-CM | POA: Insufficient documentation

## 2012-08-22 HISTORY — PX: CATARACT EXTRACTION W/PHACO: SHX586

## 2012-08-22 SURGERY — PHACOEMULSIFICATION, CATARACT, WITH IOL INSERTION
Anesthesia: Monitor Anesthesia Care | Site: Eye | Laterality: Left | Wound class: Clean

## 2012-08-22 MED ORDER — CEFAZOLIN SUBCONJUNCTIVAL INJECTION 100 MG/0.5 ML
200.0000 mg | INJECTION | SUBCONJUNCTIVAL | Status: AC
  Administered 2012-08-22: 200 mg via SUBCONJUNCTIVAL
  Filled 2012-08-22: qty 1

## 2012-08-22 MED ORDER — TRIAMCINOLONE ACETONIDE 40 MG/ML IJ SUSP
INTRAMUSCULAR | Status: AC
  Filled 2012-08-22: qty 1

## 2012-08-22 MED ORDER — DEXAMETHASONE SODIUM PHOSPHATE 10 MG/ML IJ SOLN
INTRAMUSCULAR | Status: AC
  Filled 2012-08-22: qty 1

## 2012-08-22 MED ORDER — BACITRACIN-POLYMYXIN B 500-10000 UNIT/GM OP OINT
TOPICAL_OINTMENT | OPHTHALMIC | Status: DC | PRN
  Administered 2012-08-22: 1 via OPHTHALMIC

## 2012-08-22 MED ORDER — BSS IO SOLN
INTRAOCULAR | Status: DC | PRN
  Administered 2012-08-22: 15 mL via INTRAOCULAR

## 2012-08-22 MED ORDER — HYPROMELLOSE (GONIOSCOPIC) 2.5 % OP SOLN
OPHTHALMIC | Status: AC
  Filled 2012-08-22: qty 15

## 2012-08-22 MED ORDER — TETRACAINE HCL 0.5 % OP SOLN
OPHTHALMIC | Status: AC
  Filled 2012-08-22: qty 2

## 2012-08-22 MED ORDER — ACETYLCHOLINE CHLORIDE 1:100 IO SOLR
INTRAOCULAR | Status: AC
  Filled 2012-08-22: qty 1

## 2012-08-22 MED ORDER — WATER FOR IRRIGATION, STERILE IR SOLN
Status: DC | PRN
  Administered 2012-08-22: 1000 mL

## 2012-08-22 MED ORDER — LIDOCAINE HCL 2 % IJ SOLN
INTRAMUSCULAR | Status: AC
  Filled 2012-08-22: qty 20

## 2012-08-22 MED ORDER — ACETYLCHOLINE CHLORIDE 1:100 IO SOLR
INTRAOCULAR | Status: DC | PRN
  Administered 2012-08-22: 5 mg via INTRAOCULAR

## 2012-08-22 MED ORDER — EPINEPHRINE HCL 1 MG/ML IJ SOLN
INTRAMUSCULAR | Status: AC
  Filled 2012-08-22: qty 1

## 2012-08-22 MED ORDER — EPINEPHRINE HCL 1 MG/ML IJ SOLN
INTRAOCULAR | Status: DC | PRN
  Administered 2012-08-22: 15:00:00

## 2012-08-22 MED ORDER — ACETAZOLAMIDE SODIUM 500 MG IJ SOLR
INTRAMUSCULAR | Status: AC
  Filled 2012-08-22: qty 500

## 2012-08-22 MED ORDER — BACITRACIN-POLYMYXIN B 500-10000 UNIT/GM OP OINT
TOPICAL_OINTMENT | OPHTHALMIC | Status: AC
  Filled 2012-08-22: qty 3.5

## 2012-08-22 MED ORDER — NA CHONDROIT SULF-NA HYALURON 40-30 MG/ML IO SOLN
INTRAOCULAR | Status: DC | PRN
  Administered 2012-08-22: 0.5 mL via INTRAOCULAR

## 2012-08-22 MED ORDER — BSS IO SOLN
INTRAOCULAR | Status: AC
  Filled 2012-08-22: qty 500

## 2012-08-22 MED ORDER — HYPROMELLOSE (GONIOSCOPIC) 2.5 % OP SOLN
OPHTHALMIC | Status: DC | PRN
  Administered 2012-08-22: 1 [drp]

## 2012-08-22 MED ORDER — SODIUM CHLORIDE 0.9 % IV SOLN
INTRAVENOUS | Status: DC | PRN
  Administered 2012-08-22: 15:00:00 via INTRAVENOUS

## 2012-08-22 MED ORDER — SODIUM HYALURONATE 10 MG/ML IO SOLN
INTRAOCULAR | Status: AC
  Filled 2012-08-22: qty 0.85

## 2012-08-22 MED ORDER — MIDAZOLAM HCL 5 MG/5ML IJ SOLN
INTRAMUSCULAR | Status: DC | PRN
  Administered 2012-08-22: 2 mg via INTRAVENOUS

## 2012-08-22 MED ORDER — PROVISC 10 MG/ML IO SOLN
INTRAOCULAR | Status: DC | PRN
  Administered 2012-08-22: .85 mL via INTRAOCULAR

## 2012-08-22 MED ORDER — DEXAMETHASONE SODIUM PHOSPHATE 10 MG/ML IJ SOLN
INTRAMUSCULAR | Status: DC | PRN
  Administered 2012-08-22: 10 mg

## 2012-08-22 MED ORDER — BUPIVACAINE HCL (PF) 0.75 % IJ SOLN
INTRAMUSCULAR | Status: DC | PRN
  Administered 2012-08-22: 10 mL

## 2012-08-22 MED ORDER — PROPOFOL 10 MG/ML IV BOLUS
INTRAVENOUS | Status: DC | PRN
  Administered 2012-08-22: 50 mg via INTRAVENOUS

## 2012-08-22 MED ORDER — NA CHONDROIT SULF-NA HYALURON 40-30 MG/ML IO SOLN
INTRAOCULAR | Status: AC
  Filled 2012-08-22: qty 0.5

## 2012-08-22 MED ORDER — LIDOCAINE HCL (PF) 2 % IJ SOLN
INTRAMUSCULAR | Status: DC | PRN
  Administered 2012-08-22: 10 mL

## 2012-08-22 MED ORDER — TRIAMCINOLONE ACETONIDE INTRAVITREAL INJECTION 4 MG/0.1 ML
INTRAOCULAR | Status: DC | PRN
  Administered 2012-08-22: 4 mg

## 2012-08-22 SURGICAL SUPPLY — 45 items
APPLICATOR COTTON TIP 6IN STRL (MISCELLANEOUS) ×2 IMPLANT
APPLICATOR DR MATTHEWS STRL (MISCELLANEOUS) ×2 IMPLANT
BAG MINI COLL DRAIN (WOUND CARE) ×2 IMPLANT
BLADE KERATOME 2.75 (BLADE) ×2 IMPLANT
BLADE STAB KNIFE 15DEG (BLADE) ×2 IMPLANT
CLOTH BEACON ORANGE TIMEOUT ST (SAFETY) ×2 IMPLANT
DRAPE OPHTHALMIC 77X100 STRL (CUSTOM PROCEDURE TRAY) ×2 IMPLANT
DRAPE POUCH INSTRU U-SHP 10X18 (DRAPES) ×2 IMPLANT
DRSG TEGADERM 4X4.75 (GAUZE/BANDAGES/DRESSINGS) ×2 IMPLANT
GLOVE ECLIPSE 6.5 STRL STRAW (GLOVE) ×2 IMPLANT
GLOVE SS BIOGEL STRL SZ 6.5 (GLOVE) ×1 IMPLANT
GLOVE SUPERSENSE BIOGEL SZ 6.5 (GLOVE) ×1
GOWN SRG XL XLNG 56XLVL 4 (GOWN DISPOSABLE) ×1 IMPLANT
GOWN STRL NON-REIN LRG LVL3 (GOWN DISPOSABLE) ×2 IMPLANT
GOWN STRL NON-REIN XL XLG LVL4 (GOWN DISPOSABLE) ×1
KIT BASIN OR (CUSTOM PROCEDURE TRAY) ×2 IMPLANT
KNIFE GRIESHABER SHARP 2.5MM (MISCELLANEOUS) ×2 IMPLANT
LENS IOL ACRYSOF MP POST 19.0 (Intraocular Lens) ×2 IMPLANT
NEEDLE 18GX1X1/2 (RX/OR ONLY) (NEEDLE) ×2 IMPLANT
NEEDLE 22X1 1/2 (OR ONLY) (NEEDLE) ×2 IMPLANT
NEEDLE 25GX 5/8IN NON SAFETY (NEEDLE) ×2 IMPLANT
NEEDLE FILTER BLUNT 18X 1/2SAF (NEEDLE) ×1
NEEDLE FILTER BLUNT 18X1 1/2 (NEEDLE) ×1 IMPLANT
NEEDLE HYPO 30X.5 LL (NEEDLE) ×4 IMPLANT
NS IRRIG 1000ML POUR BTL (IV SOLUTION) ×2 IMPLANT
PACK CATARACT CUSTOM (CUSTOM PROCEDURE TRAY) ×2 IMPLANT
PACK CATARACT MCHSCP (PACKS) ×2 IMPLANT
PAD ARMBOARD 7.5X6 YLW CONV (MISCELLANEOUS) ×2 IMPLANT
PHACO TIP KELMAN 45DEG (TIP) ×2 IMPLANT
SHUTTLE MONARCH TYPE A (NEEDLE) ×2 IMPLANT
SOLUTION ANTI FOG 6CC (MISCELLANEOUS) ×2 IMPLANT
SPEAR EYE SURG WECK-CEL (MISCELLANEOUS) ×2 IMPLANT
SPONGE GAUZE 4X4 12PLY (GAUZE/BANDAGES/DRESSINGS) ×2 IMPLANT
SUT ETHILON 10-0 CS-B-6CS-B-6 (SUTURE)
SUT PLAIN 6 0 TG1408 (SUTURE) IMPLANT
SUT POLY NON ABSORB 10-0 8 STR (SUTURE) IMPLANT
SUT VICRYL 6 0 S 29 12 (SUTURE) IMPLANT
SUTURE EHLN 10-0 CS-B-6CS-B-6 (SUTURE) IMPLANT
SYR 20CC LL (SYRINGE) IMPLANT
SYR 5ML LL (SYRINGE) IMPLANT
SYR TB 1ML LUER SLIP (SYRINGE) ×2 IMPLANT
SYRINGE 10CC LL (SYRINGE) IMPLANT
TOWEL OR 17X24 6PK STRL BLUE (TOWEL DISPOSABLE) ×4 IMPLANT
WATER STERILE IRR 1000ML POUR (IV SOLUTION) ×2 IMPLANT
WIPE INSTRUMENT VISIWIPE 73X73 (MISCELLANEOUS) ×2 IMPLANT

## 2012-08-22 NOTE — Anesthesia Preprocedure Evaluation (Signed)
Anesthesia Evaluation  Patient identified by MRN, date of birth, ID band Patient awake    Reviewed: Allergy & Precautions, H&P , NPO status , Patient's Chart, lab work & pertinent test results  Airway Mallampati: II TM Distance: >3 FB Neck ROM: Full    Dental  (+) Teeth Intact and Dental Advisory Given   Pulmonary neg pulmonary ROS,    Pulmonary exam normal       Cardiovascular hypertension, Pt. on medications     Neuro/Psych  Headaches,    GI/Hepatic negative GI ROS, Neg liver ROS,   Endo/Other  Hypothyroidism   Renal/GU negative Renal ROS     Musculoskeletal   Abdominal   Peds  Hematology   Anesthesia Other Findings   Reproductive/Obstetrics                           Anesthesia Physical Anesthesia Plan  ASA: III  Anesthesia Plan: MAC   Post-op Pain Management:    Induction: Intravenous  Airway Management Planned: Nasal Cannula  Additional Equipment:   Intra-op Plan:   Post-operative Plan:   Informed Consent: I have reviewed the patients History and Physical, chart, labs and discussed the procedure including the risks, benefits and alternatives for the proposed anesthesia with the patient or authorized representative who has indicated his/her understanding and acceptance.   Dental advisory given  Plan Discussed with: Surgeon and CRNA  Anesthesia Plan Comments:         Anesthesia Quick Evaluation

## 2012-08-22 NOTE — Progress Notes (Signed)
Administered 1 drop each pheynylephrine 2.5% and zymaxid 0.5% to left eye.  Scanner not working

## 2012-08-22 NOTE — Anesthesia Postprocedure Evaluation (Signed)
  Anesthesia Post-op Note  Patient: Brianna [name]  Procedure(s) Performed: Procedure(s) (LRB) with comments: CATARACT EXTRACTION PHACO AND INTRAOCULAR LENS PLACEMENT (IOC) (Left)  Patient Location: Short Stay  Anesthesia Type:MAC  Level of Consciousness: awake, alert , oriented and patient cooperative  Airway and Oxygen Therapy: Patient Spontanous Breathing  Post-op Pain: none  Post-op Assessment: Post-op Vital signs reviewed, Patient's Cardiovascular Status Stable, Respiratory Function Stable, Patent Airway, No signs of Nausea or vomiting, Adequate PO intake and Pain level controlled  Post-op Vital Signs: Reviewed and stable  Complications: No apparent anesthesia complications

## 2012-08-22 NOTE — Interval H&P Note (Signed)
History and Physical Interval Note:  08/22/2012 1:24 PM  Brianna [name]  has presented today for surgery, with the diagnosis of Cataract Left Eye  The various methods of treatment have been discussed with the patient and family. After consideration of risks, benefits and other options for treatment, the patient has consented to  Procedure(s) (LRB) with comments: CATARACT EXTRACTION PHACO AND INTRAOCULAR LENS PLACEMENT (IOC) (Left) as a surgical intervention .  The patient's history has been reviewed, patient examined, no change in status, stable for surgery.  I have reviewed the patient's chart and labs.  Questions were answered to the patient's satisfaction.     Shade Flood, MD

## 2012-08-22 NOTE — Transfer of Care (Signed)
Immediate Anesthesia Transfer of Care Note  Patient: Brianna [name]  Procedure(s) Performed: Procedure(s) (LRB) with comments: CATARACT EXTRACTION PHACO AND INTRAOCULAR LENS PLACEMENT (IOC) (Left)  Patient Location: Short Stay  Anesthesia Type:MAC  Level of Consciousness: awake, alert , oriented and patient cooperative  Airway & Oxygen Therapy: Patient Spontanous Breathing  Post-op Assessment: Report given to PACU RN, Post -op Vital signs reviewed and stable and Patient moving all extremities  Post vital signs: Reviewed and stable  Complications: No apparent anesthesia complications

## 2012-08-22 NOTE — Op Note (Signed)
Brianna [name] 08/22/2012 Cataract: Combined, Nuclear  Procedure: Phacoemulsification, Posterior Chamber Intra-ocular Lens Operative Eye:  left eye  Surgeon: Shade Flood Estimated Blood Loss: minimal Specimens for Pathology:  None Complications: none  The patient was prepared and draped in the usual manner for ocular surgery on the left eye. A Cook lid speculum was placed. A peripheral clear corneal incision was made at the surgical limbus centered at the 11:00 meridian. A separate clear corneal stab incision was made with a 15 degree blade at the 2:00 meridian to permit bi-manual technique. Viscoat and  Provisc as an underlying layer next to the capsule was instilled into the anterior chamber through that incision.  A keratome was used to create a self sealing incision entering the anterior chamber at the 11:00 meridian. A capsulorhexis was performed using a bent 25g needle. The lens was hydrodissected and the nucleus was hydrodilineated using a Nichammin cannula. The Chang chopper was inserted and used to rotate the lens to insure adequate lens mobility. The phacoemulsification handpiece was inserted and a combined phaco-chop technique was employed, fracturing the lens into separate sections with subsequent removal with the phaco handpiece.   The I/A cannula was used to remove remaining lens cortex. Provisc was instilled and used to deepen the anterior chamber and posterior capsule bag. Prior to inserting the lens a small hole was noted in the inferior capsule bag. I elected to place the IOL in the sulcus and used Miochol to bring the pupil down. The pupil came down in a round manner. There was no vitreous present in the [facility]. The Monarch injector was used to place a folded Acrysof MA50BM PC IOL, + 19.00  diopters, into the capsule bag. A Sinskey lens hook was used to dial in the trailing haptic.  The I/A cannula was used to remove the viscoelastic from the anterior chamber. BSS was used to bring  IOP to the desired range and the wound was checked to insure it was watertight. Subconjunctival injections of Ancef 100/0.35ml and Dexamethasone 0.5 ml of a 10mg /51ml solution were placed without complication. The lid speculum and drapes were removed and the patient's eye was patched with Polymixin/Bacitracin ophthalmic ointment. An eye shield was placed and the patient was transferred alert and conversant from the operating room to the post-operative recovery area.   Shade Flood, MD

## 2012-08-22 NOTE — Progress Notes (Signed)
Tetracaine 0.5% one drop administered to left eye per order. Scanner  Not working.

## 2012-08-22 NOTE — Progress Notes (Signed)
Administered 1 drop prednisolone 1% to left eye. Scanner broken.

## 2012-08-22 NOTE — Progress Notes (Signed)
Pt states she had $20.00 with belongings and refuses to have them locked in security stating "if someone wants it let them have it. I just don't want them to take my key". Patient informed that the hospital will not be responsible for valuables. She states that's okay.

## 2012-08-22 NOTE — Progress Notes (Signed)
Administered 1 drop each phenylephrine 2.5% and zymaxid 0.5% to left eye. Scanner not working

## 2012-08-22 NOTE — Progress Notes (Signed)
Administered 1 drop each of phenylephrine 2.5% and zymaxid 0.5% to left eye. Scanner broken.

## 2012-08-22 NOTE — Preoperative (Signed)
Beta Blockers   Reason not to administer Beta Blockers:Not Applicable 

## 2012-08-22 NOTE — H&P (View-Only) (Signed)
Pre-operative History and Physical for Ophthalmic Surgery  Brianna [name] 08/06/2012                  Chief Complaint: Decreased vision  Diagnosis: Cataract Left Eye  Allergies  Allergen Reactions  . Iodine Other (See Comments)    Reaction unknown  . Sulfa Antibiotics Other (See Comments)    Childhood reaction    Prior to Admission medications   Medication Sig Start Date End Date Taking? Authorizing Provider  amLODipine (NORVASC) 5 MG tablet Take 5 mg by mouth daily.    Historical Provider, MD  BLACK COHOSH PO Take 1 tablet by mouth daily.    Historical Provider, MD  Cholecalciferol (VITAMIN D3) 1000 UNITS CAPS Take 1 capsule by mouth daily.    Historical Provider, MD  clobetasol ointment (TEMOVATE) 0.05 % Apply 1 application topically 2 (two) times a week.    Historical Provider, MD  Coenzyme Q10 (CO Q 10 PO) Take 1 tablet by mouth daily.    Historical Provider, MD  CRANBERRY PO Take 1 capsule by mouth daily.    Historical Provider, MD  desonide (DESOWEN) 0.05 % cream Apply 1 application topically at bedtime.    Historical Provider, MD  fish oil-omega-3 fatty acids 1000 MG capsule Take 1 g by mouth daily.    Historical Provider, MD  gabapentin (NEURONTIN) 600 MG tablet Take 600 mg by mouth daily.    Historical Provider, MD  levothyroxine (SYNTHROID, LEVOTHROID) 125 MCG tablet Take 125 mcg by mouth daily.    Historical Provider, MD  Multiple Vitamin (MULTIVITAMIN WITH MINERALS) TABS Take 0.5 tablets by mouth 2 (two) times daily.    Historical Provider, MD  Polyethyl Glycol-Propyl Glycol (SYSTANE OP) Apply 1-2 drops to eye daily as needed. For dry eyes    Historical Provider, MD   Planned Procedure:                                       Phacoemulsification, Posterior Chamber Intra-ocular Lens Left Eye                                       Acrysof MA50BM  + 19. 00 Diopter for implant OS  There were no vitals filed for this visit.  Pulse: 70         Temp: NE        Resp:  18        ROS: non-contributory  Past Medical History  Diagnosis Date  . Headache   . Hypothyroid   . Hypertension     Past Surgical History  Procedure Date  . Abdominal hysterectomy      History   Social History  . Marital Status: Married    Spouse Name: N/A    Number of Children: N/A  . Years of Education: N/A   Occupational History  . Not on file.   Social History Main Topics  . Smoking status: Never Smoker   . Smokeless tobacco: Not on file  . Alcohol Use: Yes  . Drug Use: No  . Sexually Active:    Other Topics Concern  . Not on file   Social History Narrative  . No narrative on file     The following examination is for anesthesia clearance for minimally invasive Ophthalmic surgery. It is primarily  to document heart and lung findings and is not intended to elucidate unknown general medical conditions inclusive of abdominal masses, lung lesions, etc.   General Constitution:  within normal limits    Alertness/Orientation:  Person, time place     yes   HEENT:  Eye Findings: Cataract Left Eye                   left eye  Neck: supple without masses  Chest/Lungs: clear to auscultation  Cardiac: Normal S1 and S2 without Murmur, S3 or S4  Neuro: non-focal   Impression:  Combined Cataract  Planned Procedure:  Phacoemulsification, Posterior Chamber Intraocular Lens Left Eye   Shade Flood, MD

## 2012-08-23 ENCOUNTER — Encounter (HOSPITAL_COMMUNITY): Payer: Self-pay | Admitting: Ophthalmology

## 2013-01-10 ENCOUNTER — Other Ambulatory Visit: Payer: Self-pay | Admitting: Ophthalmology

## 2013-02-05 ENCOUNTER — Encounter (HOSPITAL_COMMUNITY): Payer: Self-pay | Admitting: Pharmacy Technician

## 2013-02-06 ENCOUNTER — Encounter (HOSPITAL_COMMUNITY): Payer: Self-pay

## 2013-02-06 ENCOUNTER — Encounter (HOSPITAL_COMMUNITY)
Admission: RE | Admit: 2013-02-06 | Discharge: 2013-02-06 | Disposition: A | Discharge status: Home / Self Care | Payer: Medicare HMO | Source: Ambulatory Visit | Attending: Ophthalmology | Admitting: Ophthalmology

## 2013-02-06 LAB — BASIC METABOLIC PANEL
BUN: 19 mg/dL (ref 6–23)
Calcium: 9.1 mg/dL (ref 8.4–10.5)
Creatinine, Ser: 0.86 mg/dL (ref 0.50–1.10)
GFR calc Af Amer: 74 mL/min — ABNORMAL LOW (ref >90)
GFR calc non Af Amer: 64 mL/min — ABNORMAL LOW (ref >90)
Potassium: 4.1 mEq/L (ref 3.5–5.1)

## 2013-02-06 LAB — CBC
MCHC: 33.7 g/dL (ref 30.0–36.0)
Platelets: 171 10*3/uL (ref 150–400)
RDW: 14.4 % (ref 11.5–15.5)

## 2013-02-06 NOTE — Pre-Procedure Instructions (Signed)
Brianna [name]  02/06/2013   Your procedure is scheduled on:  Wednesday February 13, 2013  Report to [facility] Stay [facility] at 0745 AM.  Call this number if you have problems the morning of surgery: [phone]   Remember:   Do not eat food or drink liquids after midnight.Tuesday   Take these medicines the morning of surgery with A SIP OF WATER: Amlodipine, and Levothyroxine.    Do not wear jewelry, make-up or nail polish.  Do not wear lotions, powders, or perfumes. You may wear deodorant.  Do not shave 48 hours prior to surgery.   Do not bring valuables to the hospital.  Contacts, dentures or bridgework may not be worn into surgery.  Leave suitcase in the car. After surgery it may be brought to your room.  For patients admitted to the hospital, checkout time is 11:00 AM the day of  discharge.   Patients discharged the day of surgery will not be allowed to drive  home.  Name and phone number of your driver: Son- Maisie Fus  Special Instructions: Shower using CHG 2 nights before surgery and the night before surgery.  If you shower the day of surgery use CHG.  Use special wash - you have one bottle of CHG for all showers.  You should use approximately 1/3 of the bottle for each shower.   Please read over the following fact sheets that you were given: Pain Booklet, Coughing and Deep Breathing and Surgical Site Infection Prevention

## 2013-02-06 NOTE — Progress Notes (Signed)
Left message for Dr Clarisa Kindred [phone]) to put right eye in Epic for consent . Patient stated right eye.

## 2013-02-12 MED ORDER — TETRACAINE HCL 0.5 % OP SOLN
2.0000 [drp] | OPHTHALMIC | Status: AC
  Administered 2013-02-13: 2 [drp] via OPHTHALMIC
  Filled 2013-02-12: qty 2

## 2013-02-12 MED ORDER — PREDNISOLONE ACETATE 1 % OP SUSP
1.0000 [drp] | OPHTHALMIC | Status: AC
  Administered 2013-02-13: 1 [drp] via OPHTHALMIC
  Filled 2013-02-12: qty 5

## 2013-02-12 MED ORDER — PHENYLEPHRINE HCL 2.5 % OP SOLN
1.0000 [drp] | OPHTHALMIC | Status: AC | PRN
  Administered 2013-02-13 (×3): 1 [drp] via OPHTHALMIC
  Filled 2013-02-12: qty 3

## 2013-02-12 MED ORDER — GATIFLOXACIN 0.5 % OP SOLN
1.0000 [drp] | OPHTHALMIC | Status: DC | PRN
  Administered 2013-02-13 (×2): 1 [drp] via OPHTHALMIC
  Filled 2013-02-12: qty 2.5

## 2013-02-13 ENCOUNTER — Encounter (HOSPITAL_COMMUNITY): Payer: Self-pay | Admitting: Anesthesiology

## 2013-02-13 ENCOUNTER — Encounter (HOSPITAL_COMMUNITY)
Admission: RE | Disposition: A | Discharge status: Home / Self Care | Payer: Self-pay | Source: Ambulatory Visit | Attending: Ophthalmology

## 2013-02-13 ENCOUNTER — Ambulatory Visit (HOSPITAL_COMMUNITY)
Admission: RE | Admit: 2013-02-13 | Discharge: 2013-02-13 | Disposition: A | Discharge status: Home / Self Care | Payer: Medicare HMO | Source: Ambulatory Visit | Attending: Ophthalmology | Admitting: Ophthalmology

## 2013-02-13 ENCOUNTER — Ambulatory Visit (HOSPITAL_COMMUNITY): Payer: Medicare HMO | Admitting: Anesthesiology

## 2013-02-13 DIAGNOSIS — E039 Hypothyroidism, unspecified: Secondary | ICD-10-CM | POA: Insufficient documentation

## 2013-02-13 DIAGNOSIS — H2510 Age-related nuclear cataract, unspecified eye: Secondary | ICD-10-CM | POA: Insufficient documentation

## 2013-02-13 HISTORY — PX: CATARACT EXTRACTION W/PHACO: SHX586

## 2013-02-13 SURGERY — PHACOEMULSIFICATION, CATARACT, WITH IOL INSERTION
Anesthesia: Monitor Anesthesia Care | Site: Eye | Laterality: Right | Wound class: Clean

## 2013-02-13 MED ORDER — EPINEPHRINE HCL 1 MG/ML IJ SOLN
INTRAMUSCULAR | Status: AC
  Filled 2013-02-13: qty 1

## 2013-02-13 MED ORDER — LIDOCAINE HCL 2 % IJ SOLN
INTRAMUSCULAR | Status: DC | PRN
  Administered 2013-02-13: 11:00:00 via RETROBULBAR

## 2013-02-13 MED ORDER — TRIAMCINOLONE ACETONIDE 40 MG/ML IJ SUSP
INTRAMUSCULAR | Status: AC
  Filled 2013-02-13: qty 5

## 2013-02-13 MED ORDER — HYPROMELLOSE (GONIOSCOPIC) 2.5 % OP SOLN
OPHTHALMIC | Status: AC
  Filled 2013-02-13: qty 15

## 2013-02-13 MED ORDER — BACITRACIN-POLYMYXIN B 500-10000 UNIT/GM OP OINT
TOPICAL_OINTMENT | OPHTHALMIC | Status: DC | PRN
  Administered 2013-02-13: 1 via OPHTHALMIC

## 2013-02-13 MED ORDER — LIDOCAINE HCL (CARDIAC) 20 MG/ML IV SOLN
INTRAVENOUS | Status: DC | PRN
  Administered 2013-02-13: 15 mg via INTRAVENOUS

## 2013-02-13 MED ORDER — ACETAMINOPHEN 10 MG/ML IV SOLN: 1000.0000 mg | Freq: Once | INTRAVENOUS | Status: DC | PRN

## 2013-02-13 MED ORDER — NA CHONDROIT SULF-NA HYALURON 40-30 MG/ML IO SOLN
INTRAOCULAR | Status: DC | PRN
  Administered 2013-02-13: 0.5 mL via INTRAOCULAR

## 2013-02-13 MED ORDER — SODIUM CHLORIDE 0.9 % IV SOLN
INTRAVENOUS | Status: DC
  Administered 2013-02-13: 09:00:00 via INTRAVENOUS

## 2013-02-13 MED ORDER — CEFAZOLIN SUBCONJUNCTIVAL INJECTION 100 MG/0.5 ML
200.0000 mg | INJECTION | SUBCONJUNCTIVAL | Status: DC
  Filled 2013-02-13: qty 1

## 2013-02-13 MED ORDER — FENTANYL CITRATE 0.05 MG/ML IJ SOLN
INTRAMUSCULAR | Status: DC | PRN
  Administered 2013-02-13: 25 ug via INTRAVENOUS
  Administered 2013-02-13 (×2): 50 ug via INTRAVENOUS

## 2013-02-13 MED ORDER — ONDANSETRON HCL 4 MG/2ML IJ SOLN
INTRAMUSCULAR | Status: DC | PRN
  Administered 2013-02-13: 4 mg via INTRAVENOUS

## 2013-02-13 MED ORDER — GLYCOPYRROLATE 0.2 MG/ML IJ SOLN
INTRAMUSCULAR | Status: DC | PRN
  Administered 2013-02-13: .15 mg via INTRAVENOUS

## 2013-02-13 MED ORDER — ACETAZOLAMIDE SODIUM 500 MG IJ SOLR
INTRAMUSCULAR | Status: AC
  Filled 2013-02-13: qty 500

## 2013-02-13 MED ORDER — SODIUM CHLORIDE 0.9 % IV SOLN
INTRAVENOUS | Status: DC | PRN
  Administered 2013-02-13: 10:00:00 via INTRAVENOUS

## 2013-02-13 MED ORDER — BSS IO SOLN
INTRAOCULAR | Status: DC | PRN
  Administered 2013-02-13: 15 mL via INTRAOCULAR

## 2013-02-13 MED ORDER — NA CHONDROIT SULF-NA HYALURON 40-30 MG/ML IO SOLN
INTRAOCULAR | Status: AC
  Filled 2013-02-13: qty 0.5

## 2013-02-13 MED ORDER — HYPROMELLOSE (GONIOSCOPIC) 2.5 % OP SOLN
OPHTHALMIC | Status: DC | PRN
  Administered 2013-02-13: 2 [drp]

## 2013-02-13 MED ORDER — DEXAMETHASONE SODIUM PHOSPHATE 10 MG/ML IJ SOLN
INTRAMUSCULAR | Status: AC
  Filled 2013-02-13: qty 1

## 2013-02-13 MED ORDER — LIDOCAINE HCL 2 % IJ SOLN
INTRAMUSCULAR | Status: AC
  Filled 2013-02-13: qty 20

## 2013-02-13 MED ORDER — DEXAMETHASONE SODIUM PHOSPHATE 10 MG/ML IJ SOLN
INTRAMUSCULAR | Status: DC | PRN
  Administered 2013-02-13: 10 mg

## 2013-02-13 MED ORDER — BACITRACIN-POLYMYXIN B 500-10000 UNIT/GM OP OINT
TOPICAL_OINTMENT | OPHTHALMIC | Status: AC
  Filled 2013-02-13: qty 3.5

## 2013-02-13 MED ORDER — ONDANSETRON HCL 4 MG/2ML IJ SOLN: 4.0000 mg | Freq: Once | INTRAMUSCULAR | Status: DC | PRN

## 2013-02-13 MED ORDER — BUPIVACAINE HCL (PF) 0.75 % IJ SOLN
INTRAMUSCULAR | Status: AC
  Filled 2013-02-13: qty 10

## 2013-02-13 MED ORDER — TETRACAINE HCL 0.5 % OP SOLN
OPHTHALMIC | Status: AC
  Filled 2013-02-13: qty 2

## 2013-02-13 MED ORDER — CEFAZOLIN SUBCONJUNCTIVAL INJECTION 100 MG/0.5 ML
INJECTION | SUBCONJUNCTIVAL | Status: DC | PRN
  Administered 2013-02-13: 100 mg via SUBCONJUNCTIVAL

## 2013-02-13 MED ORDER — SODIUM HYALURONATE 10 MG/ML IO SOLN
INTRAOCULAR | Status: AC
  Filled 2013-02-13: qty 0.85

## 2013-02-13 MED ORDER — EPINEPHRINE HCL 1 MG/ML IJ SOLN
INTRAOCULAR | Status: DC | PRN
  Administered 2013-02-13: 10:00:00

## 2013-02-13 MED ORDER — PROPOFOL 10 MG/ML IV BOLUS
INTRAVENOUS | Status: DC | PRN
  Administered 2013-02-13: 45 mg via INTRAVENOUS

## 2013-02-13 SURGICAL SUPPLY — 58 items
APPLICATOR COTTON TIP 6IN STRL (MISCELLANEOUS) ×2 IMPLANT
APPLICATOR DR MATTHEWS STRL (MISCELLANEOUS) ×2 IMPLANT
BAG MINI COLL DRAIN (WOUND CARE) ×2 IMPLANT
BLADE EYE MINI 60D BEAVER (BLADE) IMPLANT
BLADE KERATOME 2.75 (BLADE) ×2 IMPLANT
BLADE STAB KNIFE 15DEG (BLADE) IMPLANT
CANNULA ANTERIOR CHAMBER 27GA (MISCELLANEOUS) IMPLANT
CLOTH BEACON ORANGE TIMEOUT ST (SAFETY) ×2 IMPLANT
DRAPE OPHTHALMIC 77X100 STRL (CUSTOM PROCEDURE TRAY) ×2 IMPLANT
DRAPE POUCH INSTRU U-SHP 10X18 (DRAPES) ×2 IMPLANT
DRSG TEGADERM 4X4.75 (GAUZE/BANDAGES/DRESSINGS) ×2 IMPLANT
FILTER BLUE MILLIPORE (MISCELLANEOUS) IMPLANT
GLOVE SS BIOGEL STRL SZ 6.5 (GLOVE) ×1 IMPLANT
GLOVE SUPERSENSE BIOGEL SZ 6.5 (GLOVE) ×1
GOWN SRG XL XLNG 56XLVL 4 (GOWN DISPOSABLE) ×1 IMPLANT
GOWN STRL NON-REIN LRG LVL3 (GOWN DISPOSABLE) ×2 IMPLANT
GOWN STRL NON-REIN XL XLG LVL4 (GOWN DISPOSABLE) ×1
KIT BASIN OR (CUSTOM PROCEDURE TRAY) ×2 IMPLANT
KIT ROOM TURNOVER OR (KITS) IMPLANT
KNIFE GRIESHABER SHARP 2.5MM (MISCELLANEOUS) ×2 IMPLANT
LENS IOL ACRYSOF MP POST 19.0 (Intraocular Lens) ×2 IMPLANT
MASK EYE SHIELD (GAUZE/BANDAGES/DRESSINGS) ×2 IMPLANT
NEEDLE 18GX1X1/2 (RX/OR ONLY) (NEEDLE) IMPLANT
NEEDLE 22X1 1/2 (OR ONLY) (NEEDLE) ×2 IMPLANT
NEEDLE 25GX 5/8IN NON SAFETY (NEEDLE) ×2 IMPLANT
NEEDLE FILTER BLUNT 18X 1/2SAF (NEEDLE)
NEEDLE FILTER BLUNT 18X1 1/2 (NEEDLE) IMPLANT
NEEDLE HYPO 30X.5 LL (NEEDLE) ×4 IMPLANT
NS IRRIG 1000ML POUR BTL (IV SOLUTION) ×2 IMPLANT
PACK CATARACT CUSTOM (CUSTOM PROCEDURE TRAY) ×2 IMPLANT
PACK CATARACT MCHSCP (PACKS) ×2 IMPLANT
PACK COMBINED CATERACT/VIT 23G (OPHTHALMIC RELATED) IMPLANT
PAD ARMBOARD 7.5X6 YLW CONV (MISCELLANEOUS) ×4 IMPLANT
PAD EYE OVAL STERILE LF (GAUZE/BANDAGES/DRESSINGS) ×2 IMPLANT
PHACO TIP KELMAN 45DEG (TIP) IMPLANT
PROBE ANTERIOR 20G W/INFUS NDL (MISCELLANEOUS) IMPLANT
RING MALYGIN (MISCELLANEOUS) IMPLANT
ROLLS DENTAL (MISCELLANEOUS) IMPLANT
SHUTTLE MONARCH TYPE A (NEEDLE) ×2 IMPLANT
SOLUTION ANTI FOG 6CC (MISCELLANEOUS) IMPLANT
SPEAR EYE SURG WECK-CEL (MISCELLANEOUS) ×2 IMPLANT
SUT ETHILON 10-0 CS-B-6CS-B-6 (SUTURE)
SUT ETHILON 5 0 P 3 18 (SUTURE)
SUT ETHILON 9 0 TG140 8 (SUTURE) IMPLANT
SUT NYLON ETHILON 5-0 P-3 1X18 (SUTURE) IMPLANT
SUT PLAIN 6 0 TG1408 (SUTURE) IMPLANT
SUT POLY NON ABSORB 10-0 8 STR (SUTURE) IMPLANT
SUT VICRYL 6 0 S 29 12 (SUTURE) IMPLANT
SUTURE EHLN 10-0 CS-B-6CS-B-6 (SUTURE) IMPLANT
SYR 20CC LL (SYRINGE) IMPLANT
SYR 5ML LL (SYRINGE) IMPLANT
SYR TB 1ML LUER SLIP (SYRINGE) IMPLANT
SYRINGE 10CC LL (SYRINGE) IMPLANT
TIP ABS 45DEG FLARED 0.9MM (TIP) ×2 IMPLANT
TOWEL OR 17X24 6PK STRL BLUE (TOWEL DISPOSABLE) ×4 IMPLANT
WATER STERILE IRR 1000ML POUR (IV SOLUTION) ×2 IMPLANT
WIPE INSTRUMENT ADHESIVE BACK (MISCELLANEOUS) ×2 IMPLANT
WIPE INSTRUMENT VISIWIPE 73X73 (MISCELLANEOUS) ×2 IMPLANT

## 2013-02-13 NOTE — Anesthesia Preprocedure Evaluation (Signed)
Anesthesia Evaluation  Patient identified by MRN, date of birth, ID band Patient awake    Reviewed: Allergy & Precautions, H&P , NPO status , Patient's Chart, lab work & pertinent test results  Airway Mallampati: II TM Distance: >3 FB Neck ROM: Full    Dental  (+) Teeth Intact and Dental Advisory Given   Pulmonary neg pulmonary ROS,    Pulmonary exam normal       Cardiovascular hypertension, Pt. on medications     Neuro/Psych  Headaches,    GI/Hepatic negative GI ROS, Neg liver ROS,   Endo/Other  Hypothyroidism   Renal/GU negative Renal ROS     Musculoskeletal   Abdominal   Peds  Hematology   Anesthesia Other Findings   Reproductive/Obstetrics                           Anesthesia Physical  Anesthesia Plan  ASA: II  Anesthesia Plan: MAC   Post-op Pain Management:    Induction: Intravenous  Airway Management Planned: Nasal Cannula  Additional Equipment:   Intra-op Plan:   Post-operative Plan:   Informed Consent: I have reviewed the patients History and Physical, chart, labs and discussed the procedure including the risks, benefits and alternatives for the proposed anesthesia with the patient or authorized representative who has indicated his/her understanding and acceptance.   Dental advisory given  Plan Discussed with: CRNA  Anesthesia Plan Comments:         Anesthesia Quick Evaluation

## 2013-02-13 NOTE — Transfer of Care (Signed)
Immediate Anesthesia Transfer of Care Note  Patient: Brianna [name]  Procedure(s) Performed: Procedure(s): CATARACT EXTRACTION PHACO AND INTRAOCULAR LENS PLACEMENT (IOC) (Right)  Patient Location: PACU  Anesthesia Type:MAC  Level of Consciousness: awake, alert , oriented and sedated  Airway & Oxygen Therapy: Patient Spontanous Breathing  Post-op Assessment: Report given to PACU RN, Post -op Vital signs reviewed and stable and Patient moving all extremities  Post vital signs: Reviewed and stable  Complications: No apparent anesthesia complications

## 2013-02-13 NOTE — Op Note (Signed)
Brianna [name] 02/13/2013 Cataract: Combined, Nuclear  Procedure: Phacoemulsification, Posterior Chamber Intra-ocular Lens Operative Eye:  right eye  Surgeon: Shade Flood Estimated Blood Loss: minimal Specimens for Pathology:  None Complications: none  The patient was prepared and draped in the usual manner for ocular surgery on the right eye. A Cook lid speculum was placed. A peripheral clear corneal incision was made at the surgical limbus centered at the 11:00 meridian. A separate clear corneal stab incision was made with a 15 degree blade at the 2:00 meridian to permit bi-manual technique. Viscoat and  Provisc as an underlying layer next to the capsule was instilled into the anterior chamber through that incision.  A keratome was used to create a self sealing incision entering the anterior chamber at the 11:00 meridian. A capsulorhexis was performed using a bent 25g needle. The lens was hydrodissected and the nucleus was hydrodilineated using a Nichammin cannula. The Chang chopper was inserted and used to rotate the lens to insure adequate lens mobility. The phacoemulsification handpiece was inserted and a combined phaco-chop technique was employed, fracturing the lens into separate sections with subsequent removal with the phaco handpiece.   The I/A cannula was used to remove remaining lens cortex. Provisc was instilled and used to deepen the anterior chamber and posterior capsule bag. The Monarch injector was used to place a folded Acrysof MA50BM PC IOL, + 19.00  diopters, into the capsule bag. A Sinskey lens hook was used to dial in the trailing haptic.  The I/A cannula was used to remove the viscoelastic from the anterior chamber. BSS was used to bring IOP to the desired range and the wound was checked to insure it was watertight. Subconjunctival injections of Ancef 100/0.76ml and Dexamethasone 0.5 ml of a 10mg /60ml solution were placed without complication. The lid speculum and drapes were  removed and the patient's eye was patched with Polymixin/Bacitracin ophthalmic ointment. An eye shield was placed and the patient was transferred alert and conversant from the operating room to the post-operative recovery area.   Shade Flood, MD

## 2013-02-13 NOTE — Preoperative (Signed)
Beta Blockers   Reason not to administer Beta Blockers:Not Applicable 

## 2013-02-13 NOTE — H&P (Signed)
Pre-operative History and Physical for Ophthalmic Surgery  Brianna [name] 02/13/2013                  Chief Complaint: Diificulty seeing for driving at night, cannot see the road signs until up close to them. Television is blurry.  Diagnosis: Nuclear Sclerotic Cataract Right Eye   Manifest Refraction/Best corrected vision:   -1.50-2.00x 090   VA: 20/50    right eye Eye Exam Findings: Cornea: clear                                    Cataract: 2++ Nuclear Sclerosis                Funduscopic Exam: WNL Risks and benefits of proposed cataract surgery was discussed in detail inclusive of retina tear, retina detachment, vision loss, infection...  The patient indicated understanding our discussion and desires to proceed with planned surgery to improve their visual function right eye.  The Ophtscan AScan study indicates a power of  + 19:00 diopters for emmetropia  With Acrysof MA50BM lens                                                                                      right eye                  An  Acrysof MA50BM PC IOL will be used  for implant   Allergies  Allergen Reactions  . Iodine Other (See Comments)    Reaction unknown  . Sulfa Antibiotics Other (See Comments)    Childhood reaction     Prior to Admission medications   Medication Sig Start Date End Date Taking? Authorizing Provider  amLODipine (NORVASC) 5 MG tablet Take 5 mg by mouth daily.   Yes Historical Provider, MD  ascorbic acid (VITAMIN C) 500 MG tablet Take 500 mg by mouth daily.   Yes Historical Provider, MD  BLACK COHOSH PO Take 1 tablet by mouth daily.   Yes Historical Provider, MD  Cholecalciferol (VITAMIN D3) 1000 UNITS CAPS Take 1 capsule by mouth daily.   Yes Historical Provider, MD  Coenzyme Q10 (CO Q 10 PO) Take 1 tablet by mouth daily.   Yes Historical Provider, MD  CRANBERRY PO Take 1 capsule by mouth daily.   Yes Historical Provider, MD  desonide (DESOWEN) 0.05 % cream Apply 1 application topically 2  (two) times daily as needed. For underarms   Yes Historical Provider, MD  estradiol (ESTRACE) 0.1 MG/GM vaginal cream Place 2 g vaginally once a week.   Yes Historical Provider, MD  fish oil-omega-3 fatty acids 1000 MG capsule Take 1 g by mouth daily.   Yes Historical Provider, MD  ketoconazole (NIZORAL) 2 % cream Apply 1 application topically 2 (two) times a week. As needed for underarms   Yes Historical Provider, MD  levothyroxine (SYNTHROID, LEVOTHROID) 125 MCG tablet Take 125 mcg by mouth daily.   Yes Historical Provider, MD  meclizine (ANTIVERT) 12.5 MG tablet Take 12.5 mg by mouth 3 (three) times daily as needed. For vertigo  Yes Historical Provider, MD  Multiple Vitamin (MULTIVITAMIN WITH MINERALS) TABS Take 0.5 tablets by mouth 2 (two) times daily.   Yes Historical Provider, MD  nystatin cream (MYCOSTATIN) Apply 1 application topically 2 (two) times a week. As needed for rash   Yes Historical Provider, MD  Polyethyl Glycol-Propyl Glycol (SYSTANE OP) Apply 1-2 drops to eye daily as needed. For dry eyes   Yes Historical Provider, MD    Planned Procedure:                                       Phacoemulsification, Posterior Chamber Intra-ocular Lens Right Eye                                       Acrysof MA50BM + 19.00 Diopter PC IOL for implant OD   Filed Vitals:   02/13/13 0829  BP: 131/62  Pulse: 61  Temp: 98.2 F (36.8 C)  Resp: 18    Past Medical History  Diagnosis Date  . Headache   . Hypothyroid   . Hypertension     pcp  [facility]    Past Surgical History  Procedure Laterality Date  . Abdominal hysterectomy    . Bladder tac    . Cataract extraction w/phaco  08/22/2012    Procedure: CATARACT EXTRACTION PHACO AND INTRAOCULAR LENS PLACEMENT (IOC);  Surgeon: Shade Flood, MD;  Location: [facility] OR;  Service: Ophthalmology;  Laterality: Left;     History   Social History  . Marital Status: Widowed    Spouse Name: N/A    Number of Children: N/A  . Years of  Education: N/A   Occupational History  . Not on file.   Social History Main Topics  . Smoking status: Never Smoker   . Smokeless tobacco: Never Used  . Alcohol Use: Yes     Comment: socially  . Drug Use: No  . Sexually Active: Not on file   Other Topics Concern  . Not on file   Social History Narrative  . No narrative on file     The following examination is for anesthesia clearance for minimally invasive Ophthalmic surgery. It is primarily to document heart and lung findings and is not intended to elucidate unknown general medical conditions inclusive of abdominal masses, lung lesions, etc.   General Constitution:  within normal limits   Alertness/Orientation:  Person, time place     yes   HEENT:  Eye Findings: Nuclear Sclerotic Cataract                   right eye  Neck: supple without masses  Chest/Lungs: clear to auscultation  Cardiac: Normal S1 and S2 without Murmur, S3 or S4  Neuro: non-focal  Impression: Nuclear Sclerotic Cataract OD  Planned Procedure:  Phacoemulsification, Posterior Chamber Intraocular Lens Right [name], MD

## 2013-02-13 NOTE — Anesthesia Postprocedure Evaluation (Signed)
Anesthesia Post Note  Patient: Brianna [name]  Procedure(s) Performed: Procedure(s) (LRB): CATARACT EXTRACTION PHACO AND INTRAOCULAR LENS PLACEMENT (IOC) (Right)  Anesthesia type: MAC  Patient location: PACU  Post pain: Pain level controlled  Post assessment: Post-op Vital signs reviewed  Last Vitals: BP 131/75  Pulse 69  Temp(Src) 36.2 C (Oral)  Resp 18  SpO2 99%  Post vital signs: Reviewed  Level of consciousness: awake  Complications: No apparent anesthesia complications

## 2013-02-18 ENCOUNTER — Encounter (HOSPITAL_COMMUNITY): Payer: Self-pay | Admitting: Ophthalmology

## 2013-06-28 ENCOUNTER — Encounter (HOSPITAL_COMMUNITY): Payer: Self-pay

## 2013-06-28 ENCOUNTER — Emergency Department (HOSPITAL_COMMUNITY)
Admission: EM | Admit: 2013-06-28 | Discharge: 2013-06-28 | Disposition: A | Discharge status: Home / Self Care | Payer: Medicare HMO | Source: Home / Self Care | Attending: Emergency Medicine | Admitting: Emergency Medicine

## 2013-06-28 DIAGNOSIS — J069 Acute upper respiratory infection, unspecified: Secondary | ICD-10-CM

## 2013-06-28 MED ORDER — BENZONATATE 200 MG PO CAPS: 200.0000 mg | ORAL_CAPSULE | Freq: Three times a day (TID) | ORAL | Status: DC | PRN

## 2013-06-28 MED ORDER — FLUTICASONE PROPIONATE 50 MCG/ACT NA SUSP: 2.0000 | Freq: Every day | NASAL | Status: DC

## 2013-06-28 NOTE — ED Notes (Signed)
C/o no relief from her URI type syx w salt water gargle , delsym ; reports dry, non-productive cough, chest getting sore from cough and throat getting raw

## 2013-06-28 NOTE — ED Provider Notes (Signed)
Chief Complaint:   Chief Complaint  Patient presents with  . Cough    History of Present Illness:   Brianna [name] is a 79 year old female who has had a one-week history of sore throat with burning of the throat, burning at ears, sweats, nasal congestion, clear rhinorrhea, malaise, fatigue, and dry cough. She's been exposed to her grandson who has similar symptoms. She denies any fever, chills, headache , purulent nasal drainage, facial pain, ear pain, adenopathy, wheezing, chest pain, or GI symptoms.  Review of Systems:  Other than noted above, the patient denies any of the following symptoms: Systemic:  No fevers, chills, sweats, weight loss or gain, fatigue, or tiredness. Eye:  No redness or discharge. ENT:  No ear pain, drainage, headache, nasal congestion, drainage, sinus pressure, difficulty swallowing, or sore throat. Neck:  No neck pain or swollen glands. Lungs:  No cough, sputum production, hemoptysis, wheezing, chest tightness, shortness of breath or chest pain. GI:  No abdominal pain, nausea, vomiting or diarrhea.  PMFSH:  Past medical history, family history, social history, meds, and allergies were reviewed. She is allergic to iodine and sulfa. She takes amlodipine and a thyroid replacement. She has hypothyroidism and hypertension.  Physical Exam:   Vital signs:  BP 119/67  Pulse 63  Temp(Src) 98 F (36.7 C) (Oral)  Resp 16  SpO2 96% General:  Alert and oriented.  In no distress.  Skin warm and dry. Eye:  No conjunctival injection or drainage. Lids were normal. ENT:  TMs and canals were normal, without erythema or inflammation.  Nasal mucosa was clear and uncongested, without drainage.  Mucous membranes were moist.  Pharynx was clear with no exudate or drainage.  There were no oral ulcerations or lesions. Neck:  Supple, no adenopathy, tenderness or mass. Lungs:  No respiratory distress.  Lungs were clear to auscultation, without wheezes, rales or rhonchi.  Breath sounds  were clear and equal bilaterally.  Heart:  Regular rhythm, without gallops, murmers or rubs. Skin:  Clear, warm, and dry, without rash or lesions.  Assessment:  The encounter diagnosis was Viral URI.  No indication for antibiotics.  Plan:   1.  The following meds were prescribed:   Discharge Medication List as of 06/28/2013  5:35 PM    START taking these medications   Details  benzonatate (TESSALON) 200 MG capsule Take 1 capsule (200 mg total) by mouth 3 (three) times daily as needed for cough., Starting 06/28/2013, Until Discontinued, Normal    fluticasone (FLONASE) 50 MCG/ACT nasal spray Place 2 sprays into the nose daily., Starting 06/28/2013, Until Discontinued, Normal       2.  The patient was instructed in symptomatic care and handouts were given. 3.  The patient was told to return if becoming worse in any way, if no better in 3 or 4 days, and given some red flag symptoms such as fever, difficulty breathing, or pain that would indicate earlier return. 4.  Follow up here if necessary.      Reuben Likes, MD 06/28/13 [phone]

## 2013-12-04 ENCOUNTER — Telehealth: Payer: Self-pay | Admitting: Hematology and Oncology

## 2013-12-04 NOTE — Telephone Encounter (Signed)
LEFT MESSAGE FOR PATIENT TO RETURN CALL TO SCHEDULE NEW PATIENT APPT.  °

## 2013-12-05 ENCOUNTER — Telehealth: Payer: Self-pay | Admitting: Hematology and Oncology

## 2013-12-05 NOTE — Telephone Encounter (Signed)
New patient scheduled for 02/24 @ 1:30 w/Dr. Alvy Bimler.  Referring Dr. Jeanann Lewandowsky  Dx-ABN SPEP Welcome packet mailed.

## 2013-12-05 NOTE — Telephone Encounter (Signed)
C/D 12/05/13 for appt.12/10/13 °

## 2013-12-10 ENCOUNTER — Ambulatory Visit (HOSPITAL_BASED_OUTPATIENT_CLINIC_OR_DEPARTMENT_OTHER): Payer: Commercial Managed Care - HMO

## 2013-12-10 ENCOUNTER — Ambulatory Visit: Payer: Medicare HMO

## 2013-12-10 ENCOUNTER — Encounter: Payer: Self-pay | Admitting: Hematology and Oncology

## 2013-12-10 ENCOUNTER — Ambulatory Visit (HOSPITAL_BASED_OUTPATIENT_CLINIC_OR_DEPARTMENT_OTHER): Payer: Commercial Managed Care - HMO | Admitting: Hematology and Oncology

## 2013-12-10 ENCOUNTER — Telehealth: Payer: Self-pay | Admitting: Hematology and Oncology

## 2013-12-10 VITALS — BP 145/69 | HR 68 | Temp 97.4°F | Resp 20 | Ht 66.0 in | Wt 185.1 lb

## 2013-12-10 DIAGNOSIS — R700 Elevated erythrocyte sedimentation rate: Secondary | ICD-10-CM

## 2013-12-10 DIAGNOSIS — D472 Monoclonal gammopathy: Secondary | ICD-10-CM

## 2013-12-10 HISTORY — DX: Monoclonal gammopathy: D47.2

## 2013-12-10 LAB — CBC WITH DIFFERENTIAL/PLATELET
BASO%: 0.8 % (ref 0.0–2.0)
BASOS ABS: 0 10*3/uL (ref 0.0–0.1)
EOS ABS: 0 10*3/uL (ref 0.0–0.5)
EOS%: 0 % (ref 0.0–7.0)
HEMATOCRIT: 37.9 % (ref 34.8–46.6)
HGB: 12.4 g/dL (ref 11.6–15.9)
LYMPH%: 43.4 % (ref 14.0–49.7)
MCH: 29.3 pg (ref 25.1–34.0)
MCHC: 32.7 g/dL (ref 31.5–36.0)
MCV: 89.8 fL (ref 79.5–101.0)
MONO#: 0.4 10*3/uL (ref 0.1–0.9)
MONO%: 6.9 % (ref 0.0–14.0)
NEUT%: 48.9 % (ref 38.4–76.8)
NEUTROS ABS: 2.6 10*3/uL (ref 1.5–6.5)
PLATELETS: 168 10*3/uL (ref 145–400)
RBC: 4.22 10*6/uL (ref 3.70–5.45)
RDW: 15.6 % — ABNORMAL HIGH (ref 11.2–14.5)
WBC: 5.3 10*3/uL (ref 3.9–10.3)
lymph#: 2.3 10*3/uL (ref 0.9–3.3)

## 2013-12-10 LAB — COMPREHENSIVE METABOLIC PANEL (CC13)
ALBUMIN: 3.7 g/dL (ref 3.5–5.0)
ALK PHOS: 56 U/L (ref 40–150)
ALT: 18 U/L (ref 0–55)
AST: 17 U/L (ref 5–34)
Anion Gap: 11 mEq/L (ref 3–11)
BILIRUBIN TOTAL: 0.21 mg/dL (ref 0.20–1.20)
BUN: 20.2 mg/dL (ref 7.0–26.0)
CO2: 27 mEq/L (ref 22–29)
Calcium: 9.1 mg/dL (ref 8.4–10.4)
Chloride: 101 mEq/L (ref 98–109)
Creatinine: 0.9 mg/dL (ref 0.6–1.1)
Glucose: 90 mg/dl (ref 70–140)
POTASSIUM: 4 meq/L (ref 3.5–5.1)
SODIUM: 140 meq/L (ref 136–145)
Total Protein: 8 g/dL (ref 6.4–8.3)

## 2013-12-10 NOTE — Telephone Encounter (Signed)
gv and printed aptp sched and avs for pt for Feb adn March....sed pt to lab.Marland KitchenMarland Kitchen

## 2013-12-10 NOTE — Progress Notes (Signed)
[place] NOTE  Patient Care Team: Foye Spurling, MD as PCP - General (Internal Medicine) Heath Lark, MD as Consulting Physician (Hematology and Oncology)  CHIEF COMPLAINTS/PURPOSE OF CONSULTATION:  Elevated sedimentation rate an abnormal M spike  HISTORY OF PRESENTING ILLNESS:  Brianna [name] 80 y.o. female is here because of abnormal blood work. The patient has been having chronic headaches. She had a temporal artery biopsy that did not show giant cell arteritis and was placed on prednisone which did not improve her headaches. She had extensive evaluation including MRI that was negative. Blood work showed improvement of her sedimentation rate recently but M spike came back abnormal. According to blood work from her physician office, she had 0.7 g of M spike detected in the recent blood work. She's been referred here for further evaluation. She denies history of abnormal bone pain or bone fracture. She has chronic left shoulder pain and left hip pain that comes and goes Patient denies history of recurrent infection or atypical infections such as shingles of meningitis. Denies chills, night sweats, anorexia or abnormal weight loss.  MEDICAL HISTORY:  Past Medical History  Diagnosis Date  . Headache(784.0)   . Hypothyroid   . Hypertension     pcp  [facility]  . MGUS (monoclonal gammopathy of unknown significance) 12/10/2013    SURGICAL HISTORY: Past Surgical History  Procedure Laterality Date  . Abdominal hysterectomy    . Bladder tac    . Cataract extraction w/phaco  08/22/2012    Procedure: CATARACT EXTRACTION PHACO AND INTRAOCULAR LENS PLACEMENT (IOC);  Surgeon: Adonis Brook, MD;  Location: [facility];  Service: Ophthalmology;  Laterality: Left;  . Cataract extraction w/phaco Right 02/13/2013    Procedure: CATARACT EXTRACTION PHACO AND INTRAOCULAR LENS PLACEMENT (IOC);  Surgeon: Adonis Brook, MD;  Location: [facility];  Service: Ophthalmology;  Laterality:  Right;    SOCIAL HISTORY: History   Social History  . Marital Status: Widowed    Spouse Name: N/A    Number of Children: N/A  . Years of Education: N/A   Occupational History  . Not on file.   Social History Main Topics  . Smoking status: Never Smoker   . Smokeless tobacco: Never Used  . Alcohol Use: Yes     Comment: socially  . Drug Use: No  . Sexual Activity: Not on file   Other Topics Concern  . Not on file   Social History Narrative  . No narrative on file    FAMILY HISTORY: Family History  Problem Relation Age of Onset  . Cancer Maternal Grandfather     stomach cancer  . Cancer Paternal Grandfather     stomach cancer    ALLERGIES:  is allergic to iodine and sulfa antibiotics.  MEDICATIONS:  Current Outpatient Prescriptions  Medication Sig Dispense Refill  . amLODipine (NORVASC) 5 MG tablet Take 5 mg by mouth daily.      [name] ascorbic acid (VITAMIN C) 500 MG tablet Take 500 mg by mouth daily.      [name] aspirin 81 MG tablet Take 81 mg by mouth daily.      . Cholecalciferol (VITAMIN D3) 1000 UNITS CAPS Take 1 capsule by mouth daily.      . Coenzyme Q10 (CO Q 10 PO) Take 1 tablet by mouth daily.      [name] estradiol (ESTRACE) 0.1 MG/GM vaginal cream Place 2 g vaginally once a week.      . fish oil-omega-3 fatty acids 1000  MG capsule Take 1 g by mouth daily.      . hydrochlorothiazide (HYDRODIURIL) 12.5 MG tablet Take 12.5 mg by mouth daily as needed. As needed for ankle swelling      . ketoconazole (NIZORAL) 2 % cream Apply 1 application topically 2 (two) times a week. As needed for underarms      . levothyroxine (SYNTHROID, LEVOTHROID) 125 MCG tablet 125 mcg daily.       . meclizine (ANTIVERT) 12.5 MG tablet Take 12.5 mg by mouth 3 (three) times daily as needed. For vertigo      . Multiple Vitamin (MULTIVITAMIN WITH MINERALS) TABS Take 0.5 tablets by mouth 2 (two) times daily.      [name] nystatin cream (MYCOSTATIN) Apply 1 application topically 2 (two) times a week. As  needed for rash      . Polyethyl Glycol-Propyl Glycol (SYSTANE OP) Apply 1-2 drops to eye daily as needed. For dry eyes      . Probiotic Product (PROBIOTIC DAILY PO) Take by mouth daily.      [name] desonide (DESOWEN) 0.05 % cream Apply 1 application topically 2 (two) times daily as needed. For underarms       No current facility-administered medications for this visit.    REVIEW OF SYSTEMS:   Eyes: Denies blurriness of vision, double vision or watery eyes Ears, nose, mouth, throat, and face: Denies mucositis or sore throat Respiratory: Denies cough, dyspnea or wheezes Cardiovascular: Denies palpitation, chest discomfort or lower extremity swelling Gastrointestinal:  Denies nausea, heartburn or change in bowel habits Skin: Denies abnormal skin rashes Lymphatics: Denies new lymphadenopathy or easy bruising Neurological:Denies numbness, tingling or new weaknesses Behavioral/Psych: Mood is stable, no new changes  All other systems were reviewed with the patient and are negative.  PHYSICAL EXAMINATION: ECOG PERFORMANCE STATUS: 1 - Symptomatic but completely ambulatory  Filed Vitals:   12/10/13 1332  BP: 145/69  Pulse: 68  Temp: 97.4 F (36.3 C)  Resp: 20   Filed Weights   12/10/13 1332  Weight: 185 lb 1.6 oz (83.961 kg)    GENERAL:alert, no distress and comfortable SKIN: skin color, texture, turgor are normal, no rashes or significant lesions EYES: normal, conjunctiva are pink and non-injected, sclera clear OROPHARYNX:no exudate, no erythema and lips, buccal mucosa, and tongue normal  NECK: supple, thyroid normal size, non-tender, without nodularity LYMPH:  no palpable lymphadenopathy in the cervical, axillary or inguinal LUNGS: clear to auscultation and percussion with normal breathing effort HEART: regular rate & rhythm and no murmurs and no lower extremity edema ABDOMEN:abdomen soft, non-tender and normal bowel sounds Musculoskeletal:no cyanosis of digits and no clubbing   PSYCH: alert & oriented x 3 with fluent speech NEURO: no focal motor/sensory deficits  LABORATORY DATA:  I have reviewed the data as listed Lab Results  Component Value Date   WBC 4.4 02/06/2013   HGB 12.8 02/06/2013   HCT 38.0 02/06/2013   MCV 89.6 02/06/2013   PLT 171 02/06/2013   ASSESSMENT:  We discussed the approach to abnormal M spike   PLAN:  #1 MGUS I suspect the patient has MGUS. To rule out multiple myeloma, I recommend complete blood work, 24 hour urine collection for UPEP and skeletal survey to rule out multiple myeloma. I will see her back in 2 weeks to review test results #2 elevated sedimentation rate This could be related to MGUS. Orders Placed This Encounter  Procedures  . DG Bone Survey Met    Standing Status: Future  Number of Occurrences:      Standing Expiration Date: 02/09/2015    Order Specific Question:  Reason for Exam (SYMPTOM  OR DIAGNOSIS REQUIRED)    Answer:  staging myeloma    Order Specific Question:  Preferred imaging location?    Answer:  [facility]  . Comprehensive metabolic panel    Standing Status: Future     Number of Occurrences: 1     Standing Expiration Date: 12/10/2014  . CBC with Differential    Standing Status: Future     Number of Occurrences: 1     Standing Expiration Date: 12/10/2014  . SPEP & IFE with QIG    Standing Status: Future     Number of Occurrences: 1     Standing Expiration Date: 12/10/2014  . Kappa/lambda light chains    Standing Status: Future     Number of Occurrences: 1     Standing Expiration Date: 12/10/2014  . IFE, Urine (with Tot Prot)    Standing Status: Future     Number of Occurrences: 1     Standing Expiration Date: 12/10/2014  . Protein Electro, 24-Hour Urine    Standing Status: Future     Number of Occurrences: 1     Standing Expiration Date: 12/10/2014    All questions were answered. The patient knows to call the clinic with any problems, questions or concerns. I spent 40 minutes  counseling the patient face to face. The total time spent in the appointment was 55 minutes and more than 50% was on counseling.     [facility], [place], MD 12/10/2013 2:22 PM

## 2013-12-13 ENCOUNTER — Other Ambulatory Visit: Payer: Medicare HMO

## 2013-12-13 ENCOUNTER — Ambulatory Visit (HOSPITAL_COMMUNITY)
Admission: RE | Admit: 2013-12-13 | Discharge: 2013-12-13 | Disposition: A | Discharge status: Home / Self Care | Payer: Medicare HMO | Source: Ambulatory Visit | Attending: Hematology and Oncology | Admitting: Hematology and Oncology

## 2013-12-13 DIAGNOSIS — C9000 Multiple myeloma not having achieved remission: Secondary | ICD-10-CM | POA: Insufficient documentation

## 2013-12-13 DIAGNOSIS — D472 Monoclonal gammopathy: Secondary | ICD-10-CM

## 2013-12-13 LAB — SPEP & IFE WITH QIG
Albumin ELP: 53.3 % — ABNORMAL LOW (ref 55.8–66.1)
Alpha-1-Globulin: 3.6 % (ref 2.9–4.9)
Alpha-2-Globulin: 12.1 % — ABNORMAL HIGH (ref 7.1–11.8)
Beta 2: 4.7 % (ref 3.2–6.5)
Beta Globulin: 5.1 % (ref 4.7–7.2)
Gamma Globulin: 21.2 % — ABNORMAL HIGH (ref 11.1–18.8)
IgA: 321 mg/dL (ref 69–380)
IgG (Immunoglobin G), Serum: 1060 mg/dL (ref 690–1700)
IgM, Serum: 933 mg/dL — ABNORMAL HIGH (ref 52–322)
M-Spike, %: 0.86 g/dL
Total Protein, Serum Electrophoresis: 7.3 g/dL (ref 6.0–8.3)

## 2013-12-13 LAB — KAPPA/LAMBDA LIGHT CHAINS
Kappa free light chain: 2.32 mg/dL — ABNORMAL HIGH (ref 0.33–1.94)
Kappa:Lambda Ratio: 1.66 — ABNORMAL HIGH (ref 0.26–1.65)
Lambda Free Lght Chn: 1.4 mg/dL (ref 0.57–2.63)

## 2013-12-16 ENCOUNTER — Other Ambulatory Visit: Payer: Self-pay | Admitting: Hematology and Oncology

## 2013-12-17 LAB — UPEP/TP, 24-HR URINE
COLLECTION INTERVAL: 24 h
TOTAL PROTEIN, URINE: 3 mg/dL
Total Protein, Urine/Day: 47 mg/d — ABNORMAL LOW (ref 50–100)
Total Volume, Urine: 1550 mL

## 2013-12-17 LAB — UIFE/LIGHT CHAINS/TP QN, 24-HR UR
ALPHA 1 UR: DETECTED — AB
Albumin, U: DETECTED
Alpha 2, Urine: DETECTED — AB
Beta, Urine: DETECTED — AB
FREE KAPPA LT CHAINS, UR: 1.29 mg/dL (ref 0.14–2.42)
FREE KAPPA/LAMBDA RATIO: 16.13 ratio — AB (ref 2.04–10.37)
FREE LAMBDA EXCRETION/DAY: 1.24 mg/d
FREE LT CHN EXCR RATE: 20 mg/d
Free Lambda Lt Chains,Ur: 0.08 mg/dL (ref 0.02–0.67)
GAMMA UR: DETECTED — AB
TIME-UPE24: 24 h
TOTAL PROTEIN, URINE-UPE24: 1.9 mg/dL
Total Protein, Urine-Ur/day: 29 mg/d (ref 10–140)
Volume, Urine: 1550 mL

## 2013-12-24 ENCOUNTER — Encounter: Payer: Self-pay | Admitting: Hematology and Oncology

## 2013-12-24 ENCOUNTER — Ambulatory Visit (HOSPITAL_BASED_OUTPATIENT_CLINIC_OR_DEPARTMENT_OTHER): Payer: Medicare HMO | Admitting: Hematology and Oncology

## 2013-12-24 VITALS — BP 131/66 | HR 67 | Temp 97.4°F | Resp 18 | Ht 66.0 in | Wt 186.0 lb

## 2013-12-24 DIAGNOSIS — D472 Monoclonal gammopathy: Secondary | ICD-10-CM

## 2013-12-24 DIAGNOSIS — I10 Essential (primary) hypertension: Secondary | ICD-10-CM | POA: Insufficient documentation

## 2013-12-24 DIAGNOSIS — R51 Headache: Secondary | ICD-10-CM

## 2013-12-24 DIAGNOSIS — R519 Headache, unspecified: Secondary | ICD-10-CM

## 2013-12-24 HISTORY — DX: Headache, unspecified: R51.9

## 2013-12-24 NOTE — Progress Notes (Signed)
[facility] OFFICE PROGRESS NOTE  Patient Care Team: Foye Spurling, MD as PCP - General (Internal Medicine) Heath Lark, MD as Consulting Physician (Hematology and Oncology)  DIAGNOSIS: IgM MGUS  SUMMARY OF ONCOLOGIC HISTORY: This patient was referred here because of some chronic headaches, elevated M spike and abnormal blood work. Further testing review MGUS without end organ damage and she is being observed.  INTERVAL HISTORY: Brianna [name] 80 y.o. female returns for further followup. She has intermittent headaches several times a week. When she had headaches, she felt it "all over her head". She denies any neurological deficits or visual changes.  I have reviewed the past medical history, past surgical history, social history and family history with the patient and they are unchanged from previous note.  ALLERGIES:  is allergic to iodine and sulfa antibiotics.  MEDICATIONS:  Current Outpatient Prescriptions  Medication Sig Dispense Refill  . amLODipine (NORVASC) 5 MG tablet Take 5 mg by mouth daily.      [name] ascorbic acid (VITAMIN C) 500 MG tablet Take 500 mg by mouth daily.      [name] aspirin 81 MG tablet Take 81 mg by mouth daily.      . Cholecalciferol (VITAMIN D3) 1000 UNITS CAPS Take 1 capsule by mouth daily.      . Coenzyme Q10 (CO Q 10 PO) Take 1 tablet by mouth daily.      [name] desonide (DESOWEN) 0.05 % cream Apply 1 application topically as needed. For underarms      . estradiol (ESTRACE) 0.1 MG/GM vaginal cream Place 2 g vaginally once a week.      . fish oil-omega-3 fatty acids 1000 MG capsule Take 1 g by mouth daily.      . hydrochlorothiazide (HYDRODIURIL) 12.5 MG tablet Take 12.5 mg by mouth daily as needed. As needed for ankle swelling      . ketoconazole (NIZORAL) 2 % cream Apply 1 application topically as needed. As needed for underarms      . levothyroxine (SYNTHROID, LEVOTHROID) 125 MCG tablet 125 mcg daily.       . meclizine (ANTIVERT) 12.5 MG tablet  Take 12.5 mg by mouth 3 (three) times daily as needed. For vertigo      . Multiple Vitamin (MULTIVITAMIN WITH MINERALS) TABS Take 0.5 tablets by mouth 2 (two) times daily.      [name] nystatin cream (MYCOSTATIN) Apply 1 application topically 2 (two) times a week. As needed for rash      . Polyethyl Glycol-Propyl Glycol (SYSTANE OP) Apply 1-2 drops to eye daily as needed. For dry eyes      . Probiotic Product (PROBIOTIC DAILY PO) Take by mouth daily.       No current facility-administered medications for this visit.    REVIEW OF SYSTEMS:   Behavioral/Psych: Mood is stable, no new changes  All other systems were reviewed with the patient and are negative.  PHYSICAL EXAMINATION: ECOG PERFORMANCE STATUS: 0 - Asymptomatic  Filed Vitals:   12/24/13 1043  BP: 131/66  Pulse: 67  Temp: 97.4 F (36.3 C)  Resp: 18   Filed Weights   12/24/13 1043  Weight: 186 lb (84.369 kg)    GENERAL:alert, no distress and comfortable SKIN: skin color, texture, turgor are normal, no rashes or significant lesions Musculoskeletal:no cyanosis of digits and no clubbing  NEURO: alert & oriented x 3 with fluent speech, no focal motor/sensory deficits  LABORATORY DATA:  I have reviewed the data as listed  Component Value Date/Time   NA 140 12/10/2013 1414   NA 142 02/06/2013 1032   K 4.0 12/10/2013 1414   K 4.1 02/06/2013 1032   CL 105 02/06/2013 1032   CO2 27 12/10/2013 1414   CO2 31 02/06/2013 1032   GLUCOSE 90 12/10/2013 1414   GLUCOSE 94 02/06/2013 1032   BUN 20.2 12/10/2013 1414   BUN 19 02/06/2013 1032   CREATININE 0.9 12/10/2013 1414   CREATININE 0.86 02/06/2013 1032   CALCIUM 9.1 12/10/2013 1414   CALCIUM 9.1 02/06/2013 1032   PROT 8.0 12/10/2013 1414   PROT 8.1 05/11/2012 2303   ALBUMIN 3.7 12/10/2013 1414   ALBUMIN 3.8 05/11/2012 2303   AST 17 12/10/2013 1414   AST 20 05/11/2012 2303   ALT 18 12/10/2013 1414   ALT 15 05/11/2012 2303   ALKPHOS 56 12/10/2013 1414   ALKPHOS 54 05/11/2012 2303   BILITOT 0.21  12/10/2013 1414   BILITOT 0.2* 05/11/2012 2303   GFRNONAA 64* 02/06/2013 1032   GFRAA 74* 02/06/2013 1032    No results found for this basename: SPEP, UPEP,  kappa and lambda light chains    Lab Results  Component Value Date   WBC 5.3 12/10/2013   NEUTROABS 2.6 12/10/2013   HGB 12.4 12/10/2013   HCT 37.9 12/10/2013   MCV 89.8 12/10/2013   PLT 168 12/10/2013      Chemistry      Component Value Date/Time   NA 140 12/10/2013 1414   NA 142 02/06/2013 1032   K 4.0 12/10/2013 1414   K 4.1 02/06/2013 1032   CL 105 02/06/2013 1032   CO2 27 12/10/2013 1414   CO2 31 02/06/2013 1032   BUN 20.2 12/10/2013 1414   BUN 19 02/06/2013 1032   CREATININE 0.9 12/10/2013 1414   CREATININE 0.86 02/06/2013 1032      Component Value Date/Time   CALCIUM 9.1 12/10/2013 1414   CALCIUM 9.1 02/06/2013 1032   ALKPHOS 56 12/10/2013 1414   ALKPHOS 54 05/11/2012 2303   AST 17 12/10/2013 1414   AST 20 05/11/2012 2303   ALT 18 12/10/2013 1414   ALT 15 05/11/2012 2303   BILITOT 0.21 12/10/2013 1414   BILITOT 0.2* 05/11/2012 2303      RADIOGRAPHIC STUDIES: Skeletal survey showed no evidence of lytic lesions I have personally reviewed the radiological images as listed and agreed with the findings in the report.  ASSESSMENT & PLAN:  #1 IgM MGUS #2 headache There is no evidence to suggest progression to Waldenstrom macroglobulinemia. Although the IgM MGUS sometimes can cause headache from serum hyperviscosity, that no evidence to suggest that in her most recent blood work. I recommend observation only. I discussed with her natural history of MGUS. I will like to see her back in 6 months with history, physical examination and blood work and evaluation of disease progression.  Orders Placed This Encounter  Procedures  . CBC with Differential    Standing Status: Future     Number of Occurrences:      Standing Expiration Date: 12/24/2014  . Comprehensive metabolic panel    Standing Status: Future     Number of Occurrences:       Standing Expiration Date: 12/24/2014  . SPEP & IFE with QIG    Standing Status: Future     Number of Occurrences:      Standing Expiration Date: 12/24/2014  . Kappa/lambda light chains    Standing Status: Future     Number of Occurrences:  Standing Expiration Date: 12/24/2014  . Beta 2 microglobuline, serum    Standing Status: Future     Number of Occurrences:      Standing Expiration Date: 12/24/2014   All questions were answered. The patient knows to call the clinic with any problems, questions or concerns. No barriers to learning was detected. I spent 15 minutes counseling the patient face to face. The total time spent in the appointment was 20 minutes and more than 50% was on counseling and review of test results     [facility], [place], MD 12/24/2013 12:52 PM

## 2013-12-25 ENCOUNTER — Telehealth: Payer: Self-pay | Admitting: Hematology and Oncology

## 2014-03-12 ENCOUNTER — Other Ambulatory Visit (HOSPITAL_COMMUNITY): Payer: Self-pay | Admitting: Internal Medicine

## 2014-03-12 DIAGNOSIS — M7989 Other specified soft tissue disorders: Secondary | ICD-10-CM

## 2014-03-12 DIAGNOSIS — M79609 Pain in unspecified limb: Secondary | ICD-10-CM

## 2014-03-13 ENCOUNTER — Ambulatory Visit (HOSPITAL_COMMUNITY)
Admission: RE | Admit: 2014-03-13 | Discharge: 2014-03-13 | Disposition: A | Discharge status: Home / Self Care | Payer: Medicare HMO | Source: Ambulatory Visit | Attending: Internal Medicine | Admitting: Internal Medicine

## 2014-03-13 DIAGNOSIS — M7989 Other specified soft tissue disorders: Secondary | ICD-10-CM

## 2014-03-13 DIAGNOSIS — M79609 Pain in unspecified limb: Secondary | ICD-10-CM

## 2014-03-13 NOTE — Progress Notes (Signed)
VASCULAR LAB PRELIMINARY  PRELIMINARY  PRELIMINARY  PRELIMINARY  Bilateral lower extremity venous Dopplers completed.    Preliminary report:  There is no DVT or SVT noted in the bilateral lower extremities.   Iantha Fallen, RVT 03/13/2014, 11:11 AM

## 2014-06-02 ENCOUNTER — Telehealth: Payer: Self-pay | Admitting: Hematology and Oncology

## 2014-06-02 NOTE — Telephone Encounter (Signed)
lvm for pt regarding to Sept appt change due to MD out of the office...mailed pt appt sched and letter

## 2014-06-19 ENCOUNTER — Other Ambulatory Visit: Payer: Medicare HMO

## 2014-06-24 ENCOUNTER — Other Ambulatory Visit: Payer: Self-pay | Admitting: Hematology and Oncology

## 2014-06-24 ENCOUNTER — Telehealth: Payer: Self-pay | Admitting: *Deleted

## 2014-06-24 NOTE — Telephone Encounter (Signed)
Yes, I ordered them. PLease make her a lab appt ASAP

## 2014-06-24 NOTE — Telephone Encounter (Signed)
Pt scheduled to see Dr. Alvy Bimler on Monday 9/14.  She asks is she is supposed to have labwork done this week prior to her MD appt?

## 2014-06-24 NOTE — Telephone Encounter (Signed)
Left VM for pt requesting she call nurse back.

## 2014-06-25 ENCOUNTER — Telehealth: Payer: Self-pay | Admitting: *Deleted

## 2014-06-25 ENCOUNTER — Telehealth: Payer: Self-pay | Admitting: Hematology and Oncology

## 2014-06-25 NOTE — Telephone Encounter (Signed)
I can see her at 9/21 at 10 am, 15 mins

## 2014-06-25 NOTE — Telephone Encounter (Signed)
, °

## 2014-06-25 NOTE — Telephone Encounter (Signed)
MD visit  per 09/09 POF, pt aware of apt......KJ

## 2014-06-25 NOTE — Telephone Encounter (Signed)
Pt can come in this Friday 9/11 at 11 am for Labs.   Apologized to pt and explained we will need to r/s her appt w/ Dr. Alvy Bimler on Monday 9/14 to later in the week to allow time for lab results.   Informed pt we will call her back with date/time for office visit.  Meanwhile will add her on for lab appt this Friday.  She verbalized understanding.

## 2014-06-25 NOTE — Telephone Encounter (Signed)
Left VM informing pt of new date/time for office visit and asked her to please call back to confirm.

## 2014-06-26 ENCOUNTER — Ambulatory Visit: Payer: Medicare HMO | Admitting: Hematology and Oncology

## 2014-06-27 ENCOUNTER — Telehealth: Payer: Self-pay | Admitting: *Deleted

## 2014-06-27 ENCOUNTER — Other Ambulatory Visit: Payer: Commercial Managed Care - HMO

## 2014-06-27 NOTE — Telephone Encounter (Signed)
Pt left VM to cancel her lab appt today.  She says she will call back to reschedule.

## 2014-06-30 ENCOUNTER — Ambulatory Visit: Payer: Medicare HMO | Admitting: Hematology and Oncology

## 2014-07-07 ENCOUNTER — Ambulatory Visit: Payer: Medicare HMO | Admitting: Hematology and Oncology

## 2014-07-10 ENCOUNTER — Telehealth: Payer: Self-pay | Admitting: *Deleted

## 2014-07-10 ENCOUNTER — Telehealth: Payer: Self-pay | Admitting: Hematology and Oncology

## 2014-07-10 NOTE — Telephone Encounter (Signed)
Pt needs to r/s lab and office visit w/ Dr. Alvy Bimler,.   Requests lab on 9/29.  Office visit one week later. She cannot make it on 10/06, but can come on 10/7 or 10/8.   Request sent to Scheduling.

## 2014-07-15 ENCOUNTER — Other Ambulatory Visit (HOSPITAL_BASED_OUTPATIENT_CLINIC_OR_DEPARTMENT_OTHER): Payer: Commercial Managed Care - HMO

## 2014-07-15 DIAGNOSIS — R700 Elevated erythrocyte sedimentation rate: Secondary | ICD-10-CM

## 2014-07-15 DIAGNOSIS — R519 Headache, unspecified: Secondary | ICD-10-CM

## 2014-07-15 DIAGNOSIS — R51 Headache: Secondary | ICD-10-CM

## 2014-07-15 DIAGNOSIS — D472 Monoclonal gammopathy: Secondary | ICD-10-CM

## 2014-07-15 LAB — COMPREHENSIVE METABOLIC PANEL (CC13)
ALT: 14 U/L (ref 0–55)
ANION GAP: 7 meq/L (ref 3–11)
AST: 16 U/L (ref 5–34)
Albumin: 3.6 g/dL (ref 3.5–5.0)
Alkaline Phosphatase: 67 U/L (ref 40–150)
BILIRUBIN TOTAL: 0.28 mg/dL (ref 0.20–1.20)
BUN: 19.2 mg/dL (ref 7.0–26.0)
CO2: 29 meq/L (ref 22–29)
CREATININE: 1 mg/dL (ref 0.6–1.1)
Calcium: 9.5 mg/dL (ref 8.4–10.4)
Chloride: 107 mEq/L (ref 98–109)
GLUCOSE: 98 mg/dL (ref 70–140)
Potassium: 3.7 mEq/L (ref 3.5–5.1)
Sodium: 142 mEq/L (ref 136–145)
Total Protein: 7.7 g/dL (ref 6.4–8.3)

## 2014-07-15 LAB — CBC WITH DIFFERENTIAL/PLATELET
BASO%: 0.7 % (ref 0.0–2.0)
Basophils Absolute: 0 10*3/uL (ref 0.0–0.1)
EOS ABS: 0 10*3/uL (ref 0.0–0.5)
EOS%: 0 % (ref 0.0–7.0)
HEMATOCRIT: 37.6 % (ref 34.8–46.6)
HGB: 12.2 g/dL (ref 11.6–15.9)
LYMPH%: 48.3 % (ref 14.0–49.7)
MCH: 28.5 pg (ref 25.1–34.0)
MCHC: 32.5 g/dL (ref 31.5–36.0)
MCV: 87.6 fL (ref 79.5–101.0)
MONO#: 0.3 10*3/uL (ref 0.1–0.9)
MONO%: 7.1 % (ref 0.0–14.0)
NEUT%: 43.9 % (ref 38.4–76.8)
NEUTROS ABS: 2.1 10*3/uL (ref 1.5–6.5)
PLATELETS: 190 10*3/uL (ref 145–400)
RBC: 4.3 10*6/uL (ref 3.70–5.45)
RDW: 15.8 % — ABNORMAL HIGH (ref 11.2–14.5)
WBC: 4.7 10*3/uL (ref 3.9–10.3)
lymph#: 2.3 10*3/uL (ref 0.9–3.3)

## 2014-07-17 LAB — SPEP & IFE WITH QIG
ALPHA-2-GLOBULIN: 10.8 % (ref 7.1–11.8)
Albumin ELP: 52.5 % — ABNORMAL LOW (ref 55.8–66.1)
Alpha-1-Globulin: 3.3 % (ref 2.9–4.9)
Beta 2: 5.6 % (ref 3.2–6.5)
Beta Globulin: 4.9 % (ref 4.7–7.2)
GAMMA GLOBULIN: 22.9 % — AB (ref 11.1–18.8)
IGA: 266 mg/dL (ref 69–380)
IGG (IMMUNOGLOBIN G), SERUM: 1200 mg/dL (ref 690–1700)
IGM, SERUM: 910 mg/dL — AB (ref 52–322)
M-SPIKE, %: 0.74 g/dL
Total Protein, Serum Electrophoresis: 7.3 g/dL (ref 6.0–8.3)

## 2014-07-17 LAB — KAPPA/LAMBDA LIGHT CHAINS
KAPPA FREE LGHT CHN: 2.87 mg/dL — AB (ref 0.33–1.94)
Kappa:Lambda Ratio: 2.71 — ABNORMAL HIGH (ref 0.26–1.65)
Lambda Free Lght Chn: 1.06 mg/dL (ref 0.57–2.63)

## 2014-07-17 LAB — BETA 2 MICROGLOBULIN, SERUM: Beta-2 Microglobulin: 2.3 mg/L (ref <=2.51)

## 2014-07-24 ENCOUNTER — Encounter: Payer: Self-pay | Admitting: Hematology and Oncology

## 2014-07-24 ENCOUNTER — Ambulatory Visit (HOSPITAL_BASED_OUTPATIENT_CLINIC_OR_DEPARTMENT_OTHER): Payer: Commercial Managed Care - HMO | Admitting: Hematology and Oncology

## 2014-07-24 VITALS — BP 154/53 | HR 62 | Temp 98.2°F | Resp 17 | Ht 66.0 in | Wt 183.1 lb

## 2014-07-24 DIAGNOSIS — D472 Monoclonal gammopathy: Secondary | ICD-10-CM

## 2014-07-24 DIAGNOSIS — I10 Essential (primary) hypertension: Secondary | ICD-10-CM

## 2014-07-24 DIAGNOSIS — G44209 Tension-type headache, unspecified, not intractable: Secondary | ICD-10-CM

## 2014-07-24 DIAGNOSIS — R51 Headache: Secondary | ICD-10-CM

## 2014-07-24 DIAGNOSIS — G8929 Other chronic pain: Secondary | ICD-10-CM | POA: Insufficient documentation

## 2014-07-24 DIAGNOSIS — G4489 Other headache syndrome: Secondary | ICD-10-CM

## 2014-07-24 NOTE — Assessment & Plan Note (Addendum)
I suspect this could be causing her headache. Recommend followup with PCP for this.

## 2014-07-24 NOTE — Assessment & Plan Note (Signed)
Clinically, she has no signs of disease progression. I will see her next in 9 months with repeat history, physical examination and blood work

## 2014-07-24 NOTE — Progress Notes (Signed)
[place] OFFICE PROGRESS NOTE  Patient Care Team: Foye Spurling, MD as PCP - General (Internal Medicine) Heath Lark, MD as Consulting Physician (Hematology and Oncology)  SUMMARY OF ONCOLOGIC HISTORY: This patient was referred here because of some chronic headaches, elevated M spike and abnormal blood work. Further testing review MGUS without end organ damage and she is being observed.   INTERVAL HISTORY: Please see below for problem oriented charting. She denies recent recurrent infection. She has persistent, chronic headaches. She was seen by a neurologist for this.  REVIEW OF SYSTEMS:   Constitutional: Denies fevers, chills or abnormal weight loss Eyes: Denies blurriness of vision Ears, nose, mouth, throat, and face: Denies mucositis or sore throat Respiratory: Denies cough, dyspnea or wheezes Cardiovascular: Denies palpitation, chest discomfort or lower extremity swelling Gastrointestinal:  Denies nausea, heartburn or change in bowel habits Skin: Denies abnormal skin rashes Lymphatics: Denies new lymphadenopathy or easy bruising Neurological:Denies numbness, tingling or new weaknesses Behavioral/Psych: Mood is stable, no new changes  All other systems were reviewed with the patient and are negative.  I have reviewed the past medical history, past surgical history, social history and family history with the patient and they are unchanged from previous note.  ALLERGIES:  is allergic to iodine and sulfa antibiotics.  MEDICATIONS:  Current Outpatient Prescriptions  Medication Sig Dispense Refill  . amLODipine (NORVASC) 5 MG tablet Take 5 mg by mouth daily.      [name] ascorbic acid (VITAMIN C) 500 MG tablet Take 500 mg by mouth daily.      [name] aspirin 81 MG tablet Take 81 mg by mouth daily.      . Cholecalciferol (VITAMIN D3) 1000 UNITS CAPS Take 1 capsule by mouth daily.      . clobetasol cream (TEMOVATE) 6.19 % Apply 1 application topically 2 (two) times daily.       . Coenzyme Q10 (CO Q 10 PO) Take 1 tablet by mouth daily.      [name] desonide (DESOWEN) 0.05 % cream Apply 1 application topically as needed. For underarms      . estradiol (ESTRACE) 0.1 MG/GM vaginal cream Place 2 g vaginally once a week.      . fish oil-omega-3 fatty acids 1000 MG capsule Take 1 g by mouth daily.      . hydrochlorothiazide (HYDRODIURIL) 12.5 MG tablet Take 12.5 mg by mouth daily as needed. As needed for ankle swelling      . ketoconazole (NIZORAL) 2 % cream Apply 1 application topically as needed. As needed for underarms      . levothyroxine (SYNTHROID, LEVOTHROID) 137 MCG tablet Take 137 mcg by mouth daily before breakfast.      . meclizine (ANTIVERT) 12.5 MG tablet Take 12.5 mg by mouth 3 (three) times daily as needed. For vertigo      . Multiple Vitamin (MULTIVITAMIN WITH MINERALS) TABS Take 0.5 tablets by mouth 2 (two) times daily.      [name] nystatin cream (MYCOSTATIN) Apply 1 application topically 2 (two) times a week. As needed for rash      . Polyethyl Glycol-Propyl Glycol (SYSTANE OP) Apply 1-2 drops to eye daily as needed. For dry eyes      . Probiotic Product (PROBIOTIC DAILY PO) Take by mouth daily.       No current facility-administered medications for this visit.    PHYSICAL EXAMINATION: ECOG PERFORMANCE STATUS: 0 - Asymptomatic  Filed Vitals:   07/24/14 1414  BP: 154/53  Pulse: 62  Temp: 98.2 F (36.8 C)  Resp: 17   Filed Weights   07/24/14 1414  Weight: 183 lb 1.6 oz (83.054 kg)    GENERAL:alert, no distress and comfortable SKIN: skin color, texture, turgor are normal, no rashes or significant lesions EYES: normal, Conjunctiva are pink and non-injected, sclera clear OROPHARYNX:no exudate, no erythema and lips, buccal mucosa, and tongue normal  NECK: supple, thyroid normal size, non-tender, without nodularity LYMPH:  no palpable lymphadenopathy in the cervical, axillary or inguinal LUNGS: clear to auscultation and percussion with normal breathing  effort HEART: regular rate & rhythm and no murmurs and no lower extremity edema ABDOMEN:abdomen soft, non-tender and normal bowel sounds Musculoskeletal:no cyanosis of digits and no clubbing  NEURO: alert & oriented x 3 with fluent speech, no focal motor/sensory deficits  LABORATORY DATA:  I have reviewed the data as listed    Component Value Date/Time   NA 142 07/15/2014 0949   NA 142 02/06/2013 1032   K 3.7 07/15/2014 0949   K 4.1 02/06/2013 1032   CL 105 02/06/2013 1032   CO2 29 07/15/2014 0949   CO2 31 02/06/2013 1032   GLUCOSE 98 07/15/2014 0949   GLUCOSE 94 02/06/2013 1032   BUN 19.2 07/15/2014 0949   BUN 19 02/06/2013 1032   CREATININE 1.0 07/15/2014 0949   CREATININE 0.86 02/06/2013 1032   CALCIUM 9.5 07/15/2014 0949   CALCIUM 9.1 02/06/2013 1032   PROT 7.7 07/15/2014 0949   PROT 8.1 05/11/2012 2303   ALBUMIN 3.6 07/15/2014 0949   ALBUMIN 3.8 05/11/2012 2303   AST 16 07/15/2014 0949   AST 20 05/11/2012 2303   ALT 14 07/15/2014 0949   ALT 15 05/11/2012 2303   ALKPHOS 67 07/15/2014 0949   ALKPHOS 54 05/11/2012 2303   BILITOT 0.28 07/15/2014 0949   BILITOT 0.2* 05/11/2012 2303   GFRNONAA 64* 02/06/2013 1032   GFRAA 74* 02/06/2013 1032    No results found for this basename: SPEP,  UPEP,   kappa and lambda light chains    Lab Results  Component Value Date   WBC 4.7 07/15/2014   NEUTROABS 2.1 07/15/2014   HGB 12.2 07/15/2014   HCT 37.6 07/15/2014   MCV 87.6 07/15/2014   PLT 190 07/15/2014      Chemistry      Component Value Date/Time   NA 142 07/15/2014 0949   NA 142 02/06/2013 1032   K 3.7 07/15/2014 0949   K 4.1 02/06/2013 1032   CL 105 02/06/2013 1032   CO2 29 07/15/2014 0949   CO2 31 02/06/2013 1032   BUN 19.2 07/15/2014 0949   BUN 19 02/06/2013 1032   CREATININE 1.0 07/15/2014 0949   CREATININE 0.86 02/06/2013 1032      Component Value Date/Time   CALCIUM 9.5 07/15/2014 0949   CALCIUM 9.1 02/06/2013 1032   ALKPHOS 67 07/15/2014 0949   ALKPHOS 54 05/11/2012 2303   AST 16 07/15/2014 0949    AST 20 05/11/2012 2303   ALT 14 07/15/2014 0949   ALT 15 05/11/2012 2303   BILITOT 0.28 07/15/2014 0949   BILITOT 0.2* 05/11/2012 2303      ASSESSMENT & PLAN:  MGUS (monoclonal gammopathy of unknown significance) Clinically, she has no signs of disease progression. I will see her next in 9 months with repeat history, physical examination and blood work  Essential hypertension I suspect this could be causing her headache. Recommend followup with PCP for this.  Chronic headache The cause is unknown. She has been  seen by a neurologist for this.    Orders Placed This Encounter  Procedures  . Comprehensive metabolic panel    Standing Status: Future     Number of Occurrences:      Standing Expiration Date: 08/28/2015  . CBC with Differential    Standing Status: Future     Number of Occurrences:      Standing Expiration Date: 08/28/2015  . Lactate dehydrogenase    Standing Status: Future     Number of Occurrences:      Standing Expiration Date: 08/28/2015  . SPEP & IFE with QIG    Standing Status: Future     Number of Occurrences:      Standing Expiration Date: 08/28/2015  . Kappa/lambda light chains    Standing Status: Future     Number of Occurrences:      Standing Expiration Date: 08/28/2015  . Beta 2 microglobulin, serum    Standing Status: Future     Number of Occurrences:      Standing Expiration Date: 08/28/2015   All questions were answered. The patient knows to call the clinic with any problems, questions or concerns. No barriers to learning was detected. I spent 15 minutes counseling the patient face to face. The total time spent in the appointment was 20 minutes and more than 50% was on counseling and review of test results     [facility], [place], MD 07/24/2014 8:00 PM

## 2014-07-24 NOTE — Assessment & Plan Note (Signed)
The cause is unknown. She has been seen by a neurologist for this.

## 2014-07-25 ENCOUNTER — Telehealth: Payer: Self-pay | Admitting: Hematology and Oncology

## 2014-07-25 NOTE — Telephone Encounter (Signed)
Pt confirmed labs/ov, per 10/08 POF, per pt mailed sch..... KJ

## 2014-11-05 DIAGNOSIS — E039 Hypothyroidism, unspecified: Secondary | ICD-10-CM | POA: Diagnosis not present

## 2014-11-05 DIAGNOSIS — H1045 Other chronic allergic conjunctivitis: Secondary | ICD-10-CM | POA: Diagnosis not present

## 2014-11-05 DIAGNOSIS — I10 Essential (primary) hypertension: Secondary | ICD-10-CM | POA: Diagnosis not present

## 2014-11-17 DIAGNOSIS — L292 Pruritus vulvae: Secondary | ICD-10-CM | POA: Diagnosis not present

## 2014-11-17 DIAGNOSIS — Z124 Encounter for screening for malignant neoplasm of cervix: Secondary | ICD-10-CM | POA: Diagnosis not present

## 2014-11-17 DIAGNOSIS — Z01419 Encounter for gynecological examination (general) (routine) without abnormal findings: Secondary | ICD-10-CM | POA: Diagnosis not present

## 2014-11-17 DIAGNOSIS — N952 Postmenopausal atrophic vaginitis: Secondary | ICD-10-CM | POA: Diagnosis not present

## 2014-11-17 DIAGNOSIS — N8111 Cystocele, midline: Secondary | ICD-10-CM | POA: Diagnosis not present

## 2014-12-01 DIAGNOSIS — H16252 Phlyctenular keratoconjunctivitis, left eye: Secondary | ICD-10-CM | POA: Diagnosis not present

## 2014-12-03 DIAGNOSIS — T1502XA Foreign body in cornea, left eye, initial encounter: Secondary | ICD-10-CM | POA: Diagnosis not present

## 2014-12-04 DIAGNOSIS — H10432 Chronic follicular conjunctivitis, left eye: Secondary | ICD-10-CM | POA: Diagnosis not present

## 2014-12-08 DIAGNOSIS — H10432 Chronic follicular conjunctivitis, left eye: Secondary | ICD-10-CM | POA: Diagnosis not present

## 2014-12-08 DIAGNOSIS — T1502XA Foreign body in cornea, left eye, initial encounter: Secondary | ICD-10-CM | POA: Diagnosis not present

## 2014-12-22 DIAGNOSIS — H10432 Chronic follicular conjunctivitis, left eye: Secondary | ICD-10-CM | POA: Diagnosis not present

## 2014-12-22 DIAGNOSIS — T1502XA Foreign body in cornea, left eye, initial encounter: Secondary | ICD-10-CM | POA: Diagnosis not present

## 2015-01-28 DIAGNOSIS — E039 Hypothyroidism, unspecified: Secondary | ICD-10-CM | POA: Diagnosis not present

## 2015-01-28 DIAGNOSIS — J40 Bronchitis, not specified as acute or chronic: Secondary | ICD-10-CM | POA: Diagnosis not present

## 2015-01-28 DIAGNOSIS — I10 Essential (primary) hypertension: Secondary | ICD-10-CM | POA: Diagnosis not present

## 2015-03-10 ENCOUNTER — Emergency Department (HOSPITAL_COMMUNITY): Payer: Commercial Managed Care - HMO

## 2015-03-10 ENCOUNTER — Encounter (HOSPITAL_COMMUNITY): Payer: Self-pay | Admitting: Emergency Medicine

## 2015-03-10 ENCOUNTER — Emergency Department (INDEPENDENT_AMBULATORY_CARE_PROVIDER_SITE_OTHER)
Admission: EM | Admit: 2015-03-10 | Discharge: 2015-03-10 | Disposition: A | Discharge status: Nursing Home (Includes ICF) | Payer: Commercial Managed Care - HMO | Source: Home / Self Care | Attending: Family Medicine | Admitting: Family Medicine

## 2015-03-10 ENCOUNTER — Emergency Department (HOSPITAL_COMMUNITY)
Admission: EM | Admit: 2015-03-10 | Discharge: 2015-03-11 | Disposition: A | Discharge status: Home / Self Care | Payer: Commercial Managed Care - HMO | Attending: Emergency Medicine | Admitting: Emergency Medicine

## 2015-03-10 DIAGNOSIS — H8149 Vertigo of central origin, unspecified ear: Secondary | ICD-10-CM

## 2015-03-10 DIAGNOSIS — Z7982 Long term (current) use of aspirin: Secondary | ICD-10-CM | POA: Insufficient documentation

## 2015-03-10 DIAGNOSIS — R51 Headache: Secondary | ICD-10-CM | POA: Diagnosis not present

## 2015-03-10 DIAGNOSIS — H814 Vertigo of central origin: Secondary | ICD-10-CM

## 2015-03-10 DIAGNOSIS — R112 Nausea with vomiting, unspecified: Secondary | ICD-10-CM | POA: Diagnosis not present

## 2015-03-10 DIAGNOSIS — Z79899 Other long term (current) drug therapy: Secondary | ICD-10-CM | POA: Diagnosis not present

## 2015-03-10 DIAGNOSIS — E039 Hypothyroidism, unspecified: Secondary | ICD-10-CM | POA: Diagnosis not present

## 2015-03-10 DIAGNOSIS — I10 Essential (primary) hypertension: Secondary | ICD-10-CM | POA: Diagnosis not present

## 2015-03-10 DIAGNOSIS — R42 Dizziness and giddiness: Secondary | ICD-10-CM | POA: Diagnosis not present

## 2015-03-10 DIAGNOSIS — Z86018 Personal history of other benign neoplasm: Secondary | ICD-10-CM | POA: Diagnosis not present

## 2015-03-10 DIAGNOSIS — I629 Nontraumatic intracranial hemorrhage, unspecified: Secondary | ICD-10-CM | POA: Diagnosis not present

## 2015-03-10 DIAGNOSIS — R404 Transient alteration of awareness: Secondary | ICD-10-CM | POA: Diagnosis not present

## 2015-03-10 DIAGNOSIS — R519 Headache, unspecified: Secondary | ICD-10-CM

## 2015-03-10 LAB — URINALYSIS, ROUTINE W REFLEX MICROSCOPIC
Bilirubin Urine: NEGATIVE
GLUCOSE, UA: NEGATIVE mg/dL
KETONES UR: NEGATIVE mg/dL
LEUKOCYTES UA: NEGATIVE
Nitrite: NEGATIVE
PH: 7 (ref 5.0–8.0)
Protein, ur: NEGATIVE mg/dL
SPECIFIC GRAVITY, URINE: 1.012 (ref 1.005–1.030)
UROBILINOGEN UA: 0.2 mg/dL (ref 0.0–1.0)

## 2015-03-10 LAB — I-STAT TROPONIN, ED: Troponin i, poc: 0 ng/mL (ref 0.00–0.08)

## 2015-03-10 LAB — URINE MICROSCOPIC-ADD ON

## 2015-03-10 LAB — CBC WITH DIFFERENTIAL/PLATELET
BASOS ABS: 0 10*3/uL (ref 0.0–0.1)
Basophils Relative: 0 % (ref 0–1)
Eosinophils Absolute: 0 10*3/uL (ref 0.0–0.7)
Eosinophils Relative: 0 % (ref 0–5)
HEMATOCRIT: 38.3 % (ref 36.0–46.0)
HEMOGLOBIN: 12.7 g/dL (ref 12.0–15.0)
LYMPHS ABS: 1.9 10*3/uL (ref 0.7–4.0)
LYMPHS PCT: 26 % (ref 12–46)
MCH: 29.3 pg (ref 26.0–34.0)
MCHC: 33.2 g/dL (ref 30.0–36.0)
MCV: 88.2 fL (ref 78.0–100.0)
MONO ABS: 0.4 10*3/uL (ref 0.1–1.0)
MONOS PCT: 5 % (ref 3–12)
NEUTROS PCT: 69 % (ref 43–77)
Neutro Abs: 5.2 10*3/uL (ref 1.7–7.7)
Platelets: 178 10*3/uL (ref 150–400)
RBC: 4.34 MIL/uL (ref 3.87–5.11)
RDW: 15.1 % (ref 11.5–15.5)
WBC: 7.6 10*3/uL (ref 4.0–10.5)

## 2015-03-10 LAB — I-STAT CHEM 8, ED
BUN: 14 mg/dL (ref 6–20)
CREATININE: 0.9 mg/dL (ref 0.44–1.00)
Calcium, Ion: 1.12 mmol/L — ABNORMAL LOW (ref 1.13–1.30)
Chloride: 103 mmol/L (ref 101–111)
GLUCOSE: 103 mg/dL — AB (ref 65–99)
HEMATOCRIT: 40 % (ref 36.0–46.0)
Hemoglobin: 13.6 g/dL (ref 12.0–15.0)
Potassium: 3.6 mmol/L (ref 3.5–5.1)
SODIUM: 140 mmol/L (ref 135–145)
TCO2: 23 mmol/L (ref 0–100)

## 2015-03-10 MED ORDER — ONDANSETRON HCL 4 MG/2ML IJ SOLN
4.0000 mg | Freq: Once | INTRAMUSCULAR | Status: AC
  Administered 2015-03-10: 4 mg via INTRAVENOUS

## 2015-03-10 MED ORDER — MECLIZINE HCL 25 MG PO TABS
25.0000 mg | ORAL_TABLET | ORAL | Status: AC
  Administered 2015-03-10: 25 mg via ORAL
  Filled 2015-03-10: qty 1

## 2015-03-10 MED ORDER — ONDANSETRON HCL 4 MG/2ML IJ SOLN
INTRAMUSCULAR | Status: AC
  Filled 2015-03-10: qty 2

## 2015-03-10 MED ORDER — ACETAMINOPHEN 325 MG PO TABS
650.0000 mg | ORAL_TABLET | Freq: Once | ORAL | Status: DC
  Filled 2015-03-10: qty 2

## 2015-03-10 MED ORDER — SODIUM CHLORIDE 0.9 % IV BOLUS (SEPSIS)
1000.0000 mL | Freq: Once | INTRAVENOUS | Status: AC
  Administered 2015-03-10: 1000 mL via INTRAVENOUS

## 2015-03-10 NOTE — ED Notes (Signed)
Pt presents to ED via EMS with c/o dizziness and headache onset around 1830 today. Pt states she usually get dizziness but this one feels different that things move. Pt also reports headache with nausea and vomiting. Per EMS, CBG-100. Pt alerts and oriented x4 at this time. No neuro deficits  noted.

## 2015-03-10 NOTE — ED Notes (Signed)
Pt states that she started feeling dizzy about a hour ago and that she has had a bad headache as well

## 2015-03-10 NOTE — ED Provider Notes (Signed)
  Face-to-face evaluation   History: She reports onset of "dizziness", at 6 PM tonight. She describes the dizziness as "movement in front of my eyes". She denies diplopia or blurred vision. She has an occipital headache. No problems with walking.  Physical exam: Elderly, alert, calm, cooperative. No nystagmus. No dysarthria and aphasia or ataxia.  Medical screening examination/treatment/procedure(s) were conducted as a shared visit with non-physician practitioner(s) and myself.  I personally evaluated the patient during the encounter  Daleen Bo, MD 03/13/15 1529

## 2015-03-10 NOTE — ED Provider Notes (Signed)
CSN: [id number]     Arrival date & time 03/10/15  1914 History   First MD Initiated Contact with Patient 03/10/15 1920     No chief complaint on file.  (Consider location/radiation/quality/duration/timing/severity/associated sxs/prior Treatment) Patient is a 81 y.o. female presenting with neurologic complaint. The history is provided by the patient.  Neurologic Problem This is a new problem. The current episode started 3 to 5 hours ago (has chronic vertigo on meclizine but today sx different with room spinning and assoc n/v., now with assoc headache.). The problem has been gradually worsening. Associated symptoms include headaches. Pertinent negatives include no chest pain and no abdominal pain.    Past Medical History  Diagnosis Date  . Headache(784.0)   . Hypothyroid   . Hypertension     pcp  [facility]  . MGUS (monoclonal gammopathy of unknown significance) 12/10/2013  . Headache 12/24/2013   Past Surgical History  Procedure Laterality Date  . Abdominal hysterectomy    . Bladder tac    . Cataract extraction w/phaco  08/22/2012    Procedure: CATARACT EXTRACTION PHACO AND INTRAOCULAR LENS PLACEMENT (IOC);  Surgeon: Adonis Brook, MD;  Location: [place];  Service: Ophthalmology;  Laterality: Left;  . Cataract extraction w/phaco Right 02/13/2013    Procedure: CATARACT EXTRACTION PHACO AND INTRAOCULAR LENS PLACEMENT (IOC);  Surgeon: Adonis Brook, MD;  Location: [place];  Service: Ophthalmology;  Laterality: Right;   Family History  Problem Relation Age of Onset  . Cancer Maternal Grandfather     stomach cancer  . Cancer Paternal Grandfather     stomach cancer   History  Substance Use Topics  . Smoking status: Never Smoker   . Smokeless tobacco: Never Used  . Alcohol Use: Yes     Comment: socially   OB History    No data available     Review of Systems  Constitutional: Negative.   HENT: Negative for congestion, postnasal drip, rhinorrhea and sinus pressure.   Respiratory:  Negative.   Cardiovascular: Negative.  Negative for chest pain and palpitations.  Gastrointestinal: Negative.  Negative for abdominal pain.  Genitourinary: Negative.   Neurological: Positive for dizziness and headaches. Negative for seizures and weakness.    Allergies  Iodine and Sulfa antibiotics  Home Medications   Prior to Admission medications   Medication Sig Start Date End Date Taking? Authorizing Provider  amLODipine (NORVASC) 5 MG tablet Take 5 mg by mouth daily.    Historical Provider, MD  ascorbic acid (VITAMIN C) 500 MG tablet Take 500 mg by mouth daily.    Historical Provider, MD  aspirin 81 MG tablet Take 81 mg by mouth daily.    Historical Provider, MD  Cholecalciferol (VITAMIN D3) 1000 UNITS CAPS Take 1 capsule by mouth daily.    Historical Provider, MD  clobetasol cream (TEMOVATE) 0.96 % Apply 1 application topically 2 (two) times daily.    Historical Provider, MD  Coenzyme Q10 (CO Q 10 PO) Take 1 tablet by mouth daily.    Historical Provider, MD  desonide (DESOWEN) 0.05 % cream Apply 1 application topically as needed. For underarms    Historical Provider, MD  estradiol (ESTRACE) 0.1 MG/GM vaginal cream Place 2 g vaginally once a week.    Historical Provider, MD  fish oil-omega-3 fatty acids 1000 MG capsule Take 1 g by mouth daily.    Historical Provider, MD  hydrochlorothiazide (HYDRODIURIL) 12.5 MG tablet Take 12.5 mg by mouth daily as needed. As needed for ankle swelling  Historical Provider, MD  ketoconazole (NIZORAL) 2 % cream Apply 1 application topically as needed. As needed for underarms    Historical Provider, MD  levothyroxine (SYNTHROID, LEVOTHROID) 137 MCG tablet Take 137 mcg by mouth daily before breakfast.    Historical Provider, MD  meclizine (ANTIVERT) 12.5 MG tablet Take 12.5 mg by mouth 3 (three) times daily as needed. For vertigo    Historical Provider, MD  Multiple Vitamin (MULTIVITAMIN WITH MINERALS) TABS Take 0.5 tablets by mouth 2 (two) times daily.     Historical Provider, MD  nystatin cream (MYCOSTATIN) Apply 1 application topically 2 (two) times a week. As needed for rash    Historical Provider, MD  Polyethyl Glycol-Propyl Glycol (SYSTANE OP) Apply 1-2 drops to eye daily as needed. For dry eyes    Historical Provider, MD  Probiotic Product (PROBIOTIC DAILY PO) Take by mouth daily.    Historical Provider, MD   BP 144/64 mmHg  Pulse 68  Temp(Src) 98.6 F (37 C) (Oral)  Resp 18  SpO2 100% Physical Exam  Constitutional: She is oriented to person, place, and time. She appears well-developed and well-nourished. She appears distressed.  HENT:  Head: Normocephalic.  Right Ear: External ear normal.  Left Ear: External ear normal.  Eyes: Conjunctivae and EOM are normal. Pupils are equal, round, and reactive to light.  Neck: Trachea normal and normal range of motion. Neck supple. Carotid bruit is not present.  Cardiovascular: Normal rate, regular rhythm, normal heart sounds and intact distal pulses.   Pulmonary/Chest: Effort normal and breath sounds normal.  Abdominal: Bowel sounds are normal. There is no tenderness.  Lymphadenopathy:    She has no cervical adenopathy.  Neurological: She is alert and oriented to person, place, and time.  Skin: Skin is warm and dry.  Nursing note and vitals reviewed.   ED Course  Procedures (including critical care time) Labs Review Labs Reviewed - No data to display  Imaging Review No results found.   MDM   1. Vertigo of central origin, unspecified laterality    Sent for eval of poss vascular etiol of acute change in nl vertigo sx-now with spinning, ha and vomiting.    Billy Fischer, MD 03/10/15 [id number]

## 2015-03-10 NOTE — ED Notes (Addendum)
Pt will be transferred to ED via [facility] for further evaluation. Report called to [employer]. Pt informed of transfer and EMS

## 2015-03-10 NOTE — ED Notes (Signed)
Patient transported to CT 

## 2015-03-10 NOTE — ED Provider Notes (Signed)
CSN: [id number]     Arrival date & time 03/10/15  2031 History   First MD Initiated Contact with Patient 03/10/15 2035     Chief Complaint  Patient presents with  . Dizziness  . Headache     (Consider location/radiation/quality/duration/timing/severity/associated sxs/prior Treatment) HPI Comments:  with a patient with a history of chronic vertigo.  States she has not needed to take her medication for a while but today about 6 to 6:30.  She noticed while sitting in her again that the furniture was moving and she developed a posterior headache which is typical for she decided that she was tired and would go to bed shortly after a friend called and she told him of her symptoms, who suggested she come to the emergency department.  She did not take anything for her headache or for her vertiginous symptoms because she states that she's never had the room spinning with her vertigo.  She does report that she has nausea and she vomited one time.  On arrival into the urgent care center , but feels better after that.  She denies any recent illnesses, recent change in medications .  She has a regular PCP that she sees bimonthly for thyroid checks  She does not report any weakness of arm or leg , no visual changes.  No difficulty with speech or mentation .  No difficulty ambulating   Patient is a 81 y.o. female presenting with dizziness and headaches. The history is provided by the patient.  Dizziness Quality:  Room spinning Severity:  Moderate Onset quality:  Unable to specify Duration:  2 hours Timing:  Intermittent Progression:  Unchanged Chronicity:  New Context: standing up   Relieved by:  None tried Worsened by:  Nothing Ineffective treatments:  None tried Associated symptoms: headaches, nausea and vomiting   Associated symptoms: no blood in stool, no chest pain, no diarrhea, no hearing loss, no palpitations, no shortness of breath, no syncope, no tinnitus, no vision changes and no weakness    Headaches:    Severity:  Mild   Onset quality:  Gradual   Duration:  2 hours   Timing:  Constant   Progression:  Unchanged   Chronicity:  Recurrent Nausea:    Severity:  Mild   Onset quality:  Gradual   Duration:  2 hours   Timing:  Intermittent   Progression:  Unchanged Vomiting:    Quality:  Bilious material   Number of occurrences:  1   Severity:  Mild   Timing:  Rare   Progression:  Improving Risk factors: hx of vertigo   Risk factors: no hx of stroke and no new medications   Headache Pain location:  Occipital Quality:  Dull Radiates to:  Does not radiate Severity currently:  3/10 Severity at highest:  7/10 Onset quality:  Gradual Duration:  2 hours Timing:  Constant Progression:  Unchanged Chronicity:  Recurrent Similar to prior headaches: yes   Context: not activity, not exposure to bright light, not caffeine, not coughing, not defecating, not eating, not stress, not exposure to cold air, not intercourse, not loud noise and not straining   Relieved by:  None tried Worsened by:  Nothing Ineffective treatments:  None tried Associated symptoms: dizziness, nausea and vomiting   Associated symptoms: no abdominal pain, no back pain, no blurred vision, no congestion, no cough, no diarrhea, no ear pain, no eye pain, no facial pain, no fatigue, no fever, no focal weakness, no hearing loss, no loss of balance,  no myalgias, no neck pain, no neck stiffness, no numbness, no paresthesias, no photophobia, no seizures, no sinus pressure, no sore throat, no swollen glands, no syncope, no tingling, no URI, no visual change and no weakness   Dizziness:    Severity:  Mild   Timing:  Constant   Progression:  Unchanged   Past Medical History  Diagnosis Date  . Headache(784.0)   . Hypothyroid   . Hypertension     pcp  preston clark  . MGUS (monoclonal gammopathy of unknown significance) 12/10/2013  . Headache 12/24/2013   Past Surgical History  Procedure Laterality Date  .  Abdominal hysterectomy    . Bladder tac    . Cataract extraction w/phaco  08/22/2012    Procedure: CATARACT EXTRACTION PHACO AND INTRAOCULAR LENS PLACEMENT (IOC);  Surgeon: Adonis Brook, MD;  Location: [place];  Service: Ophthalmology;  Laterality: Left;  . Cataract extraction w/phaco Right 02/13/2013    Procedure: CATARACT EXTRACTION PHACO AND INTRAOCULAR LENS PLACEMENT (IOC);  Surgeon: Adonis Brook, MD;  Location: [place];  Service: Ophthalmology;  Laterality: Right;   Family History  Problem Relation Age of Onset  . Cancer Maternal Grandfather     stomach cancer  . Cancer Paternal Grandfather     stomach cancer   History  Substance Use Topics  . Smoking status: Never Smoker   . Smokeless tobacco: Never Used  . Alcohol Use: Yes     Comment: socially   OB History    No data available     Review of Systems  Constitutional: Negative for fever and fatigue.  HENT: Negative for congestion, ear pain, hearing loss, rhinorrhea, sinus pressure, sneezing, sore throat, tinnitus, trouble swallowing and voice change.   Eyes: Negative for blurred vision, photophobia, pain and visual disturbance.  Respiratory: Negative for cough and shortness of breath.   Cardiovascular: Negative for chest pain, palpitations, leg swelling and syncope.  Gastrointestinal: Positive for nausea and vomiting. Negative for abdominal pain, diarrhea and blood in stool.  Genitourinary: Negative for dysuria.  Musculoskeletal: Negative for myalgias, back pain, gait problem, neck pain and neck stiffness.  Skin: Negative for rash and wound.  Neurological: Positive for dizziness and headaches. Negative for focal weakness, seizures, syncope, facial asymmetry, speech difficulty, weakness, light-headedness, numbness, paresthesias and loss of balance.  All other systems reviewed and are negative.     Allergies  Iodine and Sulfa antibiotics  Home Medications   Prior to Admission medications   Medication Sig Start Date End  Date Taking? Authorizing Provider  amLODipine (NORVASC) 5 MG tablet Take 5 mg by mouth daily.   Yes Historical Provider, MD  aspirin 81 MG tablet Take 81 mg by mouth daily.   Yes Historical Provider, MD  Cholecalciferol (VITAMIN D3) 1000 UNITS CAPS Take 1 capsule by mouth daily.   Yes Historical Provider, MD  clobetasol cream (TEMOVATE) 7.84 % Apply 1 application topically 2 (two) times daily.   Yes Historical Provider, MD  Coenzyme Q10 (CO Q 10 PO) Take 1 tablet by mouth daily.   Yes Historical Provider, MD  desonide (DESOWEN) 0.05 % cream Apply 1 application topically as needed. For underarms   Yes Historical Provider, MD  estradiol (ESTRACE) 0.1 MG/GM vaginal cream Place 2 g vaginally once a week.   Yes Historical Provider, MD  fish oil-omega-3 fatty acids 1000 MG capsule Take 1 g by mouth daily.   Yes Historical Provider, MD  hydrochlorothiazide (HYDRODIURIL) 12.5 MG tablet Take 12.5 mg by mouth daily as needed.  As needed for ankle swelling   Yes Historical Provider, MD  ketoconazole (NIZORAL) 2 % cream Apply 1 application topically as needed. As needed for underarms   Yes Historical Provider, MD  levothyroxine (SYNTHROID, LEVOTHROID) 137 MCG tablet Take 137 mcg by mouth daily before breakfast.   Yes Historical Provider, MD  meclizine (ANTIVERT) 12.5 MG tablet Take 12.5 mg by mouth 3 (three) times daily as needed. For vertigo   Yes Historical Provider, MD  Multiple Vitamin (MULTIVITAMIN WITH MINERALS) TABS Take 0.5 tablets by mouth 2 (two) times daily.   Yes Historical Provider, MD  nystatin cream (MYCOSTATIN) Apply 1 application topically 2 (two) times a week. As needed for rash   Yes Historical Provider, MD  Polyethyl Glycol-Propyl Glycol (SYSTANE OP) Apply 1-2 drops to eye daily as needed. For dry eyes   Yes Historical Provider, MD  Probiotic Product (PROBIOTIC DAILY PO) Take by mouth daily.   Yes Historical Provider, MD   BP 118/52 mmHg  Pulse 70  Temp(Src) 98.3 F (36.8 C) (Oral)  Resp  14  Ht 5\' 5"  (1.651 m)  Wt 178 lb 12.8 oz (81.103 kg)  BMI 29.75 kg/m2  SpO2 96% Physical Exam  Constitutional: She is oriented to person, place, and time. She appears well-developed and well-nourished.  HENT:  Head: Normocephalic and atraumatic.  Right Ear: External ear normal.  Left Ear: External ear normal.  Mouth/Throat: Oropharynx is clear and moist.  Eyes: Pupils are equal, round, and reactive to light.  Neck: Normal range of motion.  Cardiovascular: Normal rate and regular rhythm.   Pulmonary/Chest: Effort normal and breath sounds normal.  Abdominal: Soft. Bowel sounds are normal. She exhibits no distension. There is no tenderness.  Musculoskeletal: Normal range of motion. She exhibits no edema or tenderness.  Lymphadenopathy:    She has no cervical adenopathy.  Neurological: She is alert and oriented to person, place, and time. She displays no atrophy and no tremor. No cranial nerve deficit or sensory deficit. She exhibits normal muscle tone. She displays a negative Romberg sign. She displays no seizure activity. Coordination and gait normal. GCS eye subscore is 4. GCS verbal subscore is 5. GCS motor subscore is 6.  Skin: Skin is warm and dry.  Nursing note and vitals reviewed.   ED Course  Procedures (including critical care time) Labs Review Labs Reviewed  URINALYSIS, ROUTINE W REFLEX MICROSCOPIC - Abnormal; Notable for the following:    Hgb urine dipstick TRACE (*)    All other components within normal limits  I-STAT CHEM 8, ED - Abnormal; Notable for the following:    Glucose, Bld 103 (*)    Calcium, Ion 1.12 (*)    All other components within normal limits  CBC WITH DIFFERENTIAL/PLATELET  URINE MICROSCOPIC-ADD ON  I-STAT TROPOININ, ED    Imaging Review Ct Head Wo Contrast  03/10/2015   CLINICAL DATA:  Dizziness, headache starting 18: 30 today  EXAM: CT HEAD WITHOUT CONTRAST  TECHNIQUE: Contiguous axial images were obtained from the base of the skull through the  vertex without intravenous contrast.  COMPARISON:  05/11/2012  FINDINGS: No skull fracture is noted. Paranasal sinuses and mastoid air cells are unremarkable. No intracranial hemorrhage, mass effect or midline shift. Mild cerebral atrophy. No acute cortical infarction. No mass lesion is noted on this unenhanced scan.  IMPRESSION: No acute intracranial abnormality.  Mild cerebral atrophy.   Electronically Signed   By: Lahoma Crocker M.D.   On: 03/10/2015 22:28   Mr Brain Lottie Dawson  Contrast  03/11/2015   CLINICAL DATA:  Chronic vertigo, on meclizine, with different symptoms today, associated with nausea, vomiting and headache lasting 3-5 hours. History of hypertension.  EXAM: MRI HEAD WITHOUT CONTRAST  TECHNIQUE: Multiplanar, multiecho pulse sequences of the brain and surrounding structures were obtained without intravenous contrast.  COMPARISON:  CT head Mar 10, 2015 at 2222 hours and MRI head May 12, 2012  FINDINGS: No reduced diffusion to suggest acute ischemia. Stable solitary micro hemorrhage LEFT occipital lobe. Ventricles and sulci are normal for patient's age. Mild patchy supratentorial and pontine white matter T2 hyperintensities are less than expected for age and though nonspecific are most compatible with chronic small vessel ischemic disease. No midline shift, mass effect, mass lesions.  No abnormal extra-axial fluid collections. Normal major intracranial vascular flow voids seen at the skull base.  Trace paranasal sinus mucosal thickening without air-fluid levels ; mastoid air cells are well aerated. Status post bilateral ocular lens implants. No abnormal sellar expansion. No cerebellar tonsillar ectopia. No suspicious calvarial bone marrow signal.  IMPRESSION: No acute intracranial process.  Stable appearance of the head from May 12, 2012 including mild white matter changes most compatible with chronic small vessel ischemic disease. Solitary LEFT occipital micro hemorrhage.   Electronically Signed   By:  Elon Alas M.D.   On: 03/11/2015 02:19     EKG Interpretation   Date/Time:  Tuesday Mar 10 2015 20:41:26 EDT Ventricular Rate:  66 PR Interval:  175 QRS Duration: 91 QT Interval:  447 QTC Calculation: 468 R Axis:   40 Text Interpretation:  Sinus rhythm since last tracing no significant  change Confirmed by Eulis Foster  MD, ELLIOTT [phone]) on 03/10/2015 8:48:13 PM     Will obtain screening labs, EKG, head CT.  We'll ambulate patient.  I've also asked that she be given Tylenol and meclizine for her symptoms. Patient's headache has resolved.  If the use of Tylenol.  She is no longer complaining of any dizziness.  Head CT is normal.  MRI was done for thoroughness sake, and it is normal at this time.  She has been instructed to take regular doses of meclizine for the next several days and to follow-up with her primary care physician MDM   Final diagnoses:  Headache  Vertigo          Junius Creamer, NP 03/11/15 [place], MD 03/13/15 1529

## 2015-03-11 ENCOUNTER — Emergency Department (HOSPITAL_COMMUNITY): Payer: Commercial Managed Care - HMO

## 2015-03-11 DIAGNOSIS — Z79899 Other long term (current) drug therapy: Secondary | ICD-10-CM | POA: Diagnosis not present

## 2015-03-11 DIAGNOSIS — I629 Nontraumatic intracranial hemorrhage, unspecified: Secondary | ICD-10-CM | POA: Diagnosis not present

## 2015-03-11 DIAGNOSIS — Z86018 Personal history of other benign neoplasm: Secondary | ICD-10-CM | POA: Diagnosis not present

## 2015-03-11 DIAGNOSIS — Z7982 Long term (current) use of aspirin: Secondary | ICD-10-CM | POA: Diagnosis not present

## 2015-03-11 DIAGNOSIS — R42 Dizziness and giddiness: Secondary | ICD-10-CM | POA: Diagnosis not present

## 2015-03-11 DIAGNOSIS — I10 Essential (primary) hypertension: Secondary | ICD-10-CM | POA: Diagnosis not present

## 2015-03-11 DIAGNOSIS — R112 Nausea with vomiting, unspecified: Secondary | ICD-10-CM | POA: Diagnosis not present

## 2015-03-11 DIAGNOSIS — R51 Headache: Secondary | ICD-10-CM | POA: Diagnosis not present

## 2015-03-11 DIAGNOSIS — E039 Hypothyroidism, unspecified: Secondary | ICD-10-CM | POA: Diagnosis not present

## 2015-03-11 NOTE — Discharge Instructions (Signed)
Your head CT and brain MRI are both normal, which is very reassuring.  Please take Tylenol for your headache for the next several days.  Please take meclizine on a regular basis.  Please call Dr. Carlis Abbott to make an appointment to be rechecked this week if he develops new or worsening symptoms such as weakness in arm or leg, visual changes, headache that will not stop with Tylenol on vertigo that will not respond to normal meclizine dosing and please return for further evaluation

## 2015-03-31 DIAGNOSIS — E039 Hypothyroidism, unspecified: Secondary | ICD-10-CM | POA: Diagnosis not present

## 2015-03-31 DIAGNOSIS — R51 Headache: Secondary | ICD-10-CM | POA: Diagnosis not present

## 2015-03-31 DIAGNOSIS — I10 Essential (primary) hypertension: Secondary | ICD-10-CM | POA: Diagnosis not present

## 2015-04-24 ENCOUNTER — Telehealth: Payer: Self-pay | Admitting: Hematology and Oncology

## 2015-04-24 ENCOUNTER — Ambulatory Visit: Payer: Commercial Managed Care - HMO | Admitting: Hematology and Oncology

## 2015-04-24 ENCOUNTER — Other Ambulatory Visit: Payer: Commercial Managed Care - HMO

## 2015-04-24 NOTE — Telephone Encounter (Signed)
pt called to cx appt....she did not want to r/s at this time and will call back to r/s

## 2015-06-24 DIAGNOSIS — I10 Essential (primary) hypertension: Secondary | ICD-10-CM | POA: Diagnosis not present

## 2015-06-24 DIAGNOSIS — E039 Hypothyroidism, unspecified: Secondary | ICD-10-CM | POA: Diagnosis not present

## 2015-06-24 DIAGNOSIS — R51 Headache: Secondary | ICD-10-CM | POA: Diagnosis not present

## 2015-06-25 DIAGNOSIS — I10 Essential (primary) hypertension: Secondary | ICD-10-CM | POA: Diagnosis not present

## 2015-06-25 DIAGNOSIS — E039 Hypothyroidism, unspecified: Secondary | ICD-10-CM | POA: Diagnosis not present

## 2015-06-25 DIAGNOSIS — R609 Edema, unspecified: Secondary | ICD-10-CM | POA: Diagnosis not present

## 2015-10-28 DIAGNOSIS — I10 Essential (primary) hypertension: Secondary | ICD-10-CM | POA: Diagnosis not present

## 2015-10-28 DIAGNOSIS — M2550 Pain in unspecified joint: Secondary | ICD-10-CM | POA: Diagnosis not present

## 2015-10-28 DIAGNOSIS — E039 Hypothyroidism, unspecified: Secondary | ICD-10-CM | POA: Diagnosis not present

## 2015-10-28 DIAGNOSIS — R079 Chest pain, unspecified: Secondary | ICD-10-CM | POA: Diagnosis not present

## 2015-10-28 DIAGNOSIS — H6120 Impacted cerumen, unspecified ear: Secondary | ICD-10-CM | POA: Diagnosis not present

## 2015-10-28 DIAGNOSIS — R51 Headache: Secondary | ICD-10-CM | POA: Diagnosis not present

## 2015-10-29 DIAGNOSIS — L719 Rosacea, unspecified: Secondary | ICD-10-CM | POA: Diagnosis not present

## 2015-10-29 DIAGNOSIS — Z23 Encounter for immunization: Secondary | ICD-10-CM | POA: Diagnosis not present

## 2015-10-29 DIAGNOSIS — L219 Seborrheic dermatitis, unspecified: Secondary | ICD-10-CM | POA: Diagnosis not present

## 2015-11-06 DIAGNOSIS — H6121 Impacted cerumen, right ear: Secondary | ICD-10-CM | POA: Diagnosis not present

## 2015-11-06 DIAGNOSIS — H6063 Unspecified chronic otitis externa, bilateral: Secondary | ICD-10-CM | POA: Diagnosis not present

## 2015-11-13 DIAGNOSIS — H43392 Other vitreous opacities, left eye: Secondary | ICD-10-CM | POA: Diagnosis not present

## 2015-11-13 DIAGNOSIS — H43812 Vitreous degeneration, left eye: Secondary | ICD-10-CM | POA: Diagnosis not present

## 2015-11-24 DIAGNOSIS — Z1231 Encounter for screening mammogram for malignant neoplasm of breast: Secondary | ICD-10-CM | POA: Diagnosis not present

## 2015-11-24 DIAGNOSIS — N8111 Cystocele, midline: Secondary | ICD-10-CM | POA: Diagnosis not present

## 2015-11-24 DIAGNOSIS — Z124 Encounter for screening for malignant neoplasm of cervix: Secondary | ICD-10-CM | POA: Diagnosis not present

## 2015-11-26 DIAGNOSIS — E039 Hypothyroidism, unspecified: Secondary | ICD-10-CM | POA: Diagnosis not present

## 2015-11-26 DIAGNOSIS — I10 Essential (primary) hypertension: Secondary | ICD-10-CM | POA: Diagnosis not present

## 2015-11-26 DIAGNOSIS — R51 Headache: Secondary | ICD-10-CM | POA: Diagnosis not present

## 2015-12-02 DIAGNOSIS — M8589 Other specified disorders of bone density and structure, multiple sites: Secondary | ICD-10-CM | POA: Diagnosis not present

## 2016-01-20 ENCOUNTER — Encounter (HOSPITAL_COMMUNITY): Payer: Self-pay | Admitting: *Deleted

## 2016-01-20 ENCOUNTER — Ambulatory Visit (INDEPENDENT_AMBULATORY_CARE_PROVIDER_SITE_OTHER): Payer: Commercial Managed Care - HMO

## 2016-01-20 ENCOUNTER — Ambulatory Visit (HOSPITAL_COMMUNITY)
Admission: EM | Admit: 2016-01-20 | Discharge: 2016-01-20 | Disposition: A | Discharge status: Home / Self Care | Payer: Commercial Managed Care - HMO | Attending: Family Medicine | Admitting: Family Medicine

## 2016-01-20 DIAGNOSIS — M20012 Mallet finger of left finger(s): Secondary | ICD-10-CM

## 2016-01-20 DIAGNOSIS — S6992XA Unspecified injury of left wrist, hand and finger(s), initial encounter: Secondary | ICD-10-CM | POA: Diagnosis not present
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
## 2016-01-20 NOTE — Discharge Instructions (Signed)
Leave finger splinted until seen next week by orthopedist
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
## 2016-01-20 NOTE — ED Notes (Signed)
Pt splinted   With  Finger  In  posistion    Of  Tip elevated

## 2016-01-26 DIAGNOSIS — M20012 Mallet finger of left finger(s): Secondary | ICD-10-CM | POA: Diagnosis not present

## 2016-02-23 DIAGNOSIS — M20012 Mallet finger of left finger(s): Secondary | ICD-10-CM | POA: Diagnosis not present

## 2016-03-08 DIAGNOSIS — R600 Localized edema: Secondary | ICD-10-CM | POA: Diagnosis not present

## 2016-03-08 DIAGNOSIS — I10 Essential (primary) hypertension: Secondary | ICD-10-CM | POA: Diagnosis not present

## 2016-04-01 DIAGNOSIS — M20012 Mallet finger of left finger(s): Secondary | ICD-10-CM | POA: Diagnosis not present

## 2016-04-05 DIAGNOSIS — M20012 Mallet finger of left finger(s): Secondary | ICD-10-CM | POA: Diagnosis not present

## 2016-04-07 DIAGNOSIS — M25629 Stiffness of unspecified elbow, not elsewhere classified: Secondary | ICD-10-CM | POA: Diagnosis not present

## 2016-04-07 DIAGNOSIS — E039 Hypothyroidism, unspecified: Secondary | ICD-10-CM | POA: Diagnosis not present

## 2016-04-07 DIAGNOSIS — R109 Unspecified abdominal pain: Secondary | ICD-10-CM | POA: Diagnosis not present

## 2016-04-07 DIAGNOSIS — I10 Essential (primary) hypertension: Secondary | ICD-10-CM | POA: Diagnosis not present

## 2016-04-29 DIAGNOSIS — M20012 Mallet finger of left finger(s): Secondary | ICD-10-CM | POA: Diagnosis not present

## 2016-05-03 DIAGNOSIS — M20012 Mallet finger of left finger(s): Secondary | ICD-10-CM | POA: Diagnosis not present

## 2016-05-10 DIAGNOSIS — M20012 Mallet finger of left finger(s): Secondary | ICD-10-CM | POA: Diagnosis not present

## 2016-05-18 DIAGNOSIS — M20012 Mallet finger of left finger(s): Secondary | ICD-10-CM | POA: Diagnosis not present

## 2016-05-25 DIAGNOSIS — M20012 Mallet finger of left finger(s): Secondary | ICD-10-CM | POA: Diagnosis not present

## 2016-05-27 DIAGNOSIS — M20012 Mallet finger of left finger(s): Secondary | ICD-10-CM | POA: Diagnosis not present

## 2016-06-09 DIAGNOSIS — E039 Hypothyroidism, unspecified: Secondary | ICD-10-CM | POA: Diagnosis not present

## 2016-06-09 DIAGNOSIS — I10 Essential (primary) hypertension: Secondary | ICD-10-CM | POA: Diagnosis not present

## 2016-08-17 DIAGNOSIS — N952 Postmenopausal atrophic vaginitis: Secondary | ICD-10-CM | POA: Diagnosis not present

## 2016-08-17 DIAGNOSIS — N8111 Cystocele, midline: Secondary | ICD-10-CM | POA: Diagnosis not present

## 2016-09-13 DIAGNOSIS — I10 Essential (primary) hypertension: Secondary | ICD-10-CM | POA: Diagnosis not present

## 2016-09-13 DIAGNOSIS — E039 Hypothyroidism, unspecified: Secondary | ICD-10-CM | POA: Diagnosis not present

## 2016-09-13 DIAGNOSIS — H902 Conductive hearing loss, unspecified: Secondary | ICD-10-CM | POA: Diagnosis not present

## 2016-09-30 DIAGNOSIS — H903 Sensorineural hearing loss, bilateral: Secondary | ICD-10-CM | POA: Diagnosis not present

## 2016-09-30 DIAGNOSIS — H6122 Impacted cerumen, left ear: Secondary | ICD-10-CM | POA: Diagnosis not present

## 2016-09-30 DIAGNOSIS — H6521 Chronic serous otitis media, right ear: Secondary | ICD-10-CM | POA: Diagnosis not present

## 2016-10-06 DIAGNOSIS — M25561 Pain in right knee: Secondary | ICD-10-CM | POA: Diagnosis not present

## 2016-10-06 DIAGNOSIS — I10 Essential (primary) hypertension: Secondary | ICD-10-CM | POA: Diagnosis not present

## 2016-10-06 DIAGNOSIS — E039 Hypothyroidism, unspecified: Secondary | ICD-10-CM | POA: Diagnosis not present

## 2016-11-17 ENCOUNTER — Other Ambulatory Visit: Payer: Self-pay | Admitting: Internal Medicine

## 2016-11-17 DIAGNOSIS — R600 Localized edema: Secondary | ICD-10-CM | POA: Diagnosis not present

## 2016-11-17 DIAGNOSIS — R609 Edema, unspecified: Secondary | ICD-10-CM

## 2016-11-17 DIAGNOSIS — R8290 Unspecified abnormal findings in urine: Secondary | ICD-10-CM | POA: Diagnosis not present

## 2016-11-17 DIAGNOSIS — I10 Essential (primary) hypertension: Secondary | ICD-10-CM | POA: Diagnosis not present

## 2016-11-17 DIAGNOSIS — E781 Pure hyperglyceridemia: Secondary | ICD-10-CM | POA: Diagnosis not present

## 2016-11-17 DIAGNOSIS — E039 Hypothyroidism, unspecified: Secondary | ICD-10-CM | POA: Diagnosis not present

## 2016-11-18 ENCOUNTER — Ambulatory Visit (HOSPITAL_COMMUNITY)
Admission: RE | Admit: 2016-11-18 | Discharge: 2016-11-18 | Disposition: A | Discharge status: Home / Self Care | Payer: Medicare HMO | Source: Ambulatory Visit | Attending: Internal Medicine | Admitting: Internal Medicine

## 2016-11-18 DIAGNOSIS — R600 Localized edema: Secondary | ICD-10-CM | POA: Insufficient documentation

## 2016-11-18 DIAGNOSIS — R599 Enlarged lymph nodes, unspecified: Secondary | ICD-10-CM | POA: Diagnosis not present

## 2016-11-18 DIAGNOSIS — R609 Edema, unspecified: Secondary | ICD-10-CM

## 2016-11-18 NOTE — Progress Notes (Signed)
*  PRELIMINARY RESULTS* Vascular Ultrasound Lower extremity venous duplex has been completed.  Preliminary findings: No evidence of DVT or baker's cyst.   Called results to Dr. Carlis Abbott.    Landry Mellow, RDMS, RVT  11/18/2016, 11:16 AM

## 2017-02-21 DIAGNOSIS — E039 Hypothyroidism, unspecified: Secondary | ICD-10-CM | POA: Diagnosis not present

## 2017-02-21 DIAGNOSIS — E559 Vitamin D deficiency, unspecified: Secondary | ICD-10-CM | POA: Diagnosis not present

## 2017-02-21 DIAGNOSIS — R51 Headache: Secondary | ICD-10-CM | POA: Diagnosis not present

## 2017-02-21 DIAGNOSIS — I10 Essential (primary) hypertension: Secondary | ICD-10-CM | POA: Diagnosis not present

## 2017-03-21 ENCOUNTER — Other Ambulatory Visit: Payer: Self-pay | Admitting: Internal Medicine

## 2017-03-21 DIAGNOSIS — E2839 Other primary ovarian failure: Secondary | ICD-10-CM

## 2017-03-21 DIAGNOSIS — I10 Essential (primary) hypertension: Secondary | ICD-10-CM | POA: Diagnosis not present

## 2017-03-21 DIAGNOSIS — E039 Hypothyroidism, unspecified: Secondary | ICD-10-CM | POA: Diagnosis not present

## 2017-03-21 DIAGNOSIS — Z1231 Encounter for screening mammogram for malignant neoplasm of breast: Secondary | ICD-10-CM

## 2017-04-04 ENCOUNTER — Ambulatory Visit: Payer: Commercial Managed Care - HMO

## 2017-04-04 ENCOUNTER — Other Ambulatory Visit: Payer: Commercial Managed Care - HMO

## 2017-04-14 ENCOUNTER — Ambulatory Visit
Admission: RE | Admit: 2017-04-14 | Discharge: 2017-04-14 | Disposition: A | Discharge status: Home / Self Care | Payer: Medicare HMO | Source: Ambulatory Visit | Attending: Internal Medicine | Admitting: Internal Medicine

## 2017-04-14 DIAGNOSIS — Z1231 Encounter for screening mammogram for malignant neoplasm of breast: Secondary | ICD-10-CM | POA: Diagnosis not present

## 2017-04-14 DIAGNOSIS — E2839 Other primary ovarian failure: Secondary | ICD-10-CM

## 2017-04-14 DIAGNOSIS — M8588 Other specified disorders of bone density and structure, other site: Secondary | ICD-10-CM | POA: Diagnosis not present

## 2017-04-16 HISTORY — PX: BREAST BIOPSY: SHX20

## 2017-04-18 ENCOUNTER — Other Ambulatory Visit: Payer: Self-pay | Admitting: Internal Medicine

## 2017-04-18 DIAGNOSIS — R928 Other abnormal and inconclusive findings on diagnostic imaging of breast: Secondary | ICD-10-CM

## 2017-04-24 ENCOUNTER — Ambulatory Visit
Admission: RE | Admit: 2017-04-24 | Discharge: 2017-04-24 | Disposition: A | Discharge status: Home / Self Care | Payer: Medicare HMO | Source: Ambulatory Visit | Attending: Internal Medicine | Admitting: Internal Medicine

## 2017-04-24 ENCOUNTER — Other Ambulatory Visit: Payer: Self-pay | Admitting: Internal Medicine

## 2017-04-24 DIAGNOSIS — N6489 Other specified disorders of breast: Secondary | ICD-10-CM | POA: Diagnosis not present

## 2017-04-24 DIAGNOSIS — R921 Mammographic calcification found on diagnostic imaging of breast: Secondary | ICD-10-CM

## 2017-04-24 DIAGNOSIS — R928 Other abnormal and inconclusive findings on diagnostic imaging of breast: Secondary | ICD-10-CM

## 2017-05-02 ENCOUNTER — Ambulatory Visit
Admission: RE | Admit: 2017-05-02 | Discharge: 2017-05-02 | Disposition: A | Discharge status: Home / Self Care | Payer: Medicare HMO | Source: Ambulatory Visit | Attending: Internal Medicine | Admitting: Internal Medicine

## 2017-05-02 DIAGNOSIS — N6311 Unspecified lump in the right breast, upper outer quadrant: Secondary | ICD-10-CM | POA: Diagnosis not present

## 2017-05-02 DIAGNOSIS — R921 Mammographic calcification found on diagnostic imaging of breast: Secondary | ICD-10-CM | POA: Diagnosis not present

## 2018-09-02 ENCOUNTER — Emergency Department (HOSPITAL_COMMUNITY): Payer: Medicare HMO

## 2018-09-02 ENCOUNTER — Emergency Department (HOSPITAL_COMMUNITY)
Admission: EM | Admit: 2018-09-02 | Discharge: 2018-09-02 | Disposition: A | Discharge status: Home / Self Care | Payer: Medicare HMO | Attending: Emergency Medicine | Admitting: Emergency Medicine

## 2018-09-02 ENCOUNTER — Encounter (HOSPITAL_COMMUNITY): Payer: Self-pay | Admitting: Emergency Medicine

## 2018-09-02 DIAGNOSIS — R51 Headache: Secondary | ICD-10-CM | POA: Diagnosis not present

## 2018-09-02 DIAGNOSIS — Z7982 Long term (current) use of aspirin: Secondary | ICD-10-CM | POA: Insufficient documentation

## 2018-09-02 DIAGNOSIS — I10 Essential (primary) hypertension: Secondary | ICD-10-CM | POA: Insufficient documentation

## 2018-09-02 DIAGNOSIS — D472 Monoclonal gammopathy: Secondary | ICD-10-CM | POA: Diagnosis not present

## 2018-09-02 DIAGNOSIS — E039 Hypothyroidism, unspecified: Secondary | ICD-10-CM | POA: Diagnosis not present

## 2018-09-02 DIAGNOSIS — Z79899 Other long term (current) drug therapy: Secondary | ICD-10-CM | POA: Diagnosis not present

## 2018-09-02 DIAGNOSIS — R42 Dizziness and giddiness: Secondary | ICD-10-CM | POA: Diagnosis present

## 2018-09-02 DIAGNOSIS — R270 Ataxia, unspecified: Secondary | ICD-10-CM | POA: Diagnosis not present

## 2018-09-02 LAB — DIFFERENTIAL
Abs Immature Granulocytes: 0.02 10*3/uL (ref 0.00–0.07)
BASOS ABS: 0 10*3/uL (ref 0.0–0.1)
BASOS PCT: 0 %
EOS ABS: 0 10*3/uL (ref 0.0–0.5)
EOS PCT: 0 %
IMMATURE GRANULOCYTES: 0 %
Lymphocytes Relative: 26 %
Lymphs Abs: 1.6 10*3/uL (ref 0.7–4.0)
Monocytes Absolute: 0.3 10*3/uL (ref 0.1–1.0)
Monocytes Relative: 5 %
NEUTROS PCT: 69 %
Neutro Abs: 4.3 10*3/uL (ref 1.7–7.7)

## 2018-09-02 LAB — CBC
HEMATOCRIT: 39 % (ref 36.0–46.0)
Hemoglobin: 12.5 g/dL (ref 12.0–15.0)
MCH: 29.3 pg (ref 26.0–34.0)
MCHC: 32.1 g/dL (ref 30.0–36.0)
MCV: 91.5 fL (ref 80.0–100.0)
NRBC: 0 % (ref 0.0–0.2)
Platelets: 146 10*3/uL — ABNORMAL LOW (ref 150–400)
RBC: 4.26 MIL/uL (ref 3.87–5.11)
RDW: 15.3 % (ref 11.5–15.5)
WBC: 6.3 10*3/uL (ref 4.0–10.5)

## 2018-09-02 LAB — RAPID URINE DRUG SCREEN, HOSP PERFORMED
AMPHETAMINES: NOT DETECTED
Barbiturates: NOT DETECTED
Benzodiazepines: NOT DETECTED
COCAINE: NOT DETECTED
OPIATES: NOT DETECTED
TETRAHYDROCANNABINOL: NOT DETECTED

## 2018-09-02 LAB — COMPREHENSIVE METABOLIC PANEL
ALBUMIN: 3.6 g/dL (ref 3.5–5.0)
ALT: 16 U/L (ref 0–44)
ANION GAP: 8 (ref 5–15)
AST: 22 U/L (ref 15–41)
Alkaline Phosphatase: 55 U/L (ref 38–126)
BUN: 13 mg/dL (ref 8–23)
CO2: 27 mmol/L (ref 22–32)
Calcium: 8.6 mg/dL — ABNORMAL LOW (ref 8.9–10.3)
Chloride: 102 mmol/L (ref 98–111)
Creatinine, Ser: 0.85 mg/dL (ref 0.44–1.00)
GFR calc Af Amer: >60 mL/min (ref >60)
GFR calc non Af Amer: >60 mL/min (ref >60)
GLUCOSE: 122 mg/dL — AB (ref 70–99)
POTASSIUM: 2.9 mmol/L — AB (ref 3.5–5.1)
SODIUM: 137 mmol/L (ref 135–145)
TOTAL PROTEIN: 7.6 g/dL (ref 6.5–8.1)
Total Bilirubin: 0.5 mg/dL (ref 0.3–1.2)

## 2018-09-02 LAB — I-STAT TROPONIN, ED: Troponin i, poc: 0 ng/mL (ref 0.00–0.08)

## 2018-09-02 LAB — URINALYSIS, ROUTINE W REFLEX MICROSCOPIC
BACTERIA UA: NONE SEEN
Bilirubin Urine: NEGATIVE
Glucose, UA: NEGATIVE mg/dL
KETONES UR: NEGATIVE mg/dL
LEUKOCYTES UA: NEGATIVE
Nitrite: NEGATIVE
PH: 8 (ref 5.0–8.0)
Protein, ur: NEGATIVE mg/dL
Specific Gravity, Urine: 1.008 (ref 1.005–1.030)

## 2018-09-02 LAB — APTT: aPTT: 28 seconds (ref 24–36)

## 2018-09-02 LAB — PROTIME-INR
INR: 1.01
PROTHROMBIN TIME: 13.2 s (ref 11.4–15.2)

## 2018-09-02 MED ORDER — SODIUM CHLORIDE 0.9 % IV BOLUS
500.0000 mL | Freq: Once | INTRAVENOUS | Status: AC
  Administered 2018-09-02: 500 mL via INTRAVENOUS

## 2018-09-02 MED ORDER — SODIUM CHLORIDE 0.9 % IV SOLN
100.0000 mL/h | INTRAVENOUS | Status: DC
  Administered 2018-09-02: 100 mL/h via INTRAVENOUS

## 2018-09-02 MED ORDER — ACETAMINOPHEN 500 MG PO TABS
500.0000 mg | ORAL_TABLET | Freq: Once | ORAL | Status: AC
  Administered 2018-09-02: 500 mg via ORAL
  Filled 2018-09-02: qty 1

## 2018-09-02 MED ORDER — MECLIZINE HCL 12.5 MG PO TABS: 12.5000 mg | ORAL_TABLET | Freq: Three times a day (TID) | ORAL | 0 refills | Status: DC | PRN

## 2018-09-02 NOTE — ED Notes (Signed)
Patient transported to CT 

## 2018-09-02 NOTE — ED Notes (Signed)
Patient transported to MRI 

## 2018-09-02 NOTE — ED Triage Notes (Signed)
Pt arrives with complaints of dizziness and feeling as though things on the wall are moving around. Reports she has a headache as well. Denies weakness.

## 2018-09-02 NOTE — Discharge Instructions (Signed)
As discussed, your evaluation today has been largely reassuring.  But, it is important that you monitor your condition carefully, and do not hesitate to return to the ED if you develop new, or concerning changes in your condition. ? ?Otherwise, please follow-up with your physician for appropriate ongoing care. ? ?

## 2018-09-02 NOTE — ED Notes (Signed)
Patient ambulating in hallway.

## 2018-09-02 NOTE — ED Notes (Signed)
ED Provider at bedside. 

## 2018-09-02 NOTE — ED Notes (Signed)
Dr.Lockwood at bedside  

## 2018-09-02 NOTE — ED Provider Notes (Signed)
[facility] Provider Note   CSN: [id number] Arrival date & time: 09/02/18  0109     History   Chief Complaint Chief Complaint  Patient presents with  . Dizziness    HPI Brianna [name] is a 84 y.o. female.  HPI Presents with new headache, dizziness. She notes that she awoke this morning with dizziness and head pressure, bilateral parietal. The dizziness is described as unsteadiness, without actual visual deficiency, without actual weakness, without actual falling. No clear precipitant, and since onset the dizziness has improved substantially, is only minimally present. However, she does continue to have persistent mild bilateral headache. No fever, no chills, no nausea, no vomiting, no pain, aside from the headache.  Patient is here with a family member who assists with the HPI.  Past Medical History:  Diagnosis Date  . Headache 12/24/2013  . Headache(784.0)   . Hypertension    pcp  preston clark  . Hypothyroid   . MGUS (monoclonal gammopathy of unknown significance) 12/10/2013    Patient Active Problem List   Diagnosis Date Noted  . Chronic headache 07/24/2014  . Essential hypertension 12/24/2013  . MGUS (monoclonal gammopathy of unknown significance) 12/10/2013    Past Surgical History:  Procedure Laterality Date  . ABDOMINAL HYSTERECTOMY    . bladder tac    . CATARACT EXTRACTION W/PHACO  08/22/2012   Procedure: CATARACT EXTRACTION PHACO AND INTRAOCULAR LENS PLACEMENT (IOC);  Surgeon: Adonis Brook, MD;  Location: [place] [facility];  Service: Ophthalmology;  Laterality: Left;  . CATARACT EXTRACTION W/PHACO Right 02/13/2013   Procedure: CATARACT EXTRACTION PHACO AND INTRAOCULAR LENS PLACEMENT (IOC);  Surgeon: Adonis Brook, MD;  Location: [place];  Service: Ophthalmology;  Laterality: Right;     OB History   None      Home Medications    Prior to Admission medications   Medication Sig Start Date End Date Taking? Authorizing  Provider  amLODipine (NORVASC) 5 MG tablet Take 5 mg by mouth daily.   Yes [provider]  aspirin 81 MG tablet Take 81 mg by mouth daily.   Yes [provider]  Cholecalciferol (VITAMIN D3) 125 MCG (5000 UT) CAPS Take 5,000 capsules by mouth daily.    Yes [provider]  fish oil-omega-3 fatty acids 1000 MG capsule Take 1 g by mouth daily.   Yes [provider]  furosemide (LASIX) 40 MG tablet Take 40 mg by mouth daily as needed for fluid or edema.   Yes [provider]  levothyroxine (SYNTHROID, LEVOTHROID) 125 MCG tablet Take 125 mcg by mouth every other day. Odd days only.   Yes [provider]  Multiple Vitamin (MULTIVITAMIN WITH MINERALS) TABS Take 1 tablet by mouth daily.    Yes [provider]  meclizine (ANTIVERT) 12.5 MG tablet Take 1 tablet (12.5 mg total) by mouth 3 (three) times daily as needed for dizziness. 09/02/18   Carmin Muskrat, MD    Family History Family History  Problem Relation Age of Onset  . Cancer Maternal Grandfather        stomach cancer  . Cancer Paternal Grandfather        stomach cancer    Social History Social History   Tobacco Use  . Smoking status: Never Smoker  . Smokeless tobacco: Never Used  Substance Use Topics  . Alcohol use: Yes    Comment: socially  . Drug use: No     Allergies   Iodine and Sulfa antibiotics   Review  of Systems Review of Systems  Constitutional:       Per HPI, otherwise negative  HENT:       Per HPI, otherwise negative  Respiratory:       Per HPI, otherwise negative  Cardiovascular:       Per HPI, otherwise negative  Gastrointestinal: Negative for vomiting.  Endocrine:       Negative aside from HPI  Genitourinary:       Neg aside from HPI   Musculoskeletal:       Per HPI, otherwise negative  Skin: Negative.   Neurological: Positive for dizziness and headaches. Negative for syncope.     Physical Exam Updated Vital Signs BP (!) 138/55  (BP Location: Right Arm)   Pulse (!) 56   Temp (!) 97.4 F (36.3 C) (Oral)   Resp 18   Ht 5' (1.524 m)   Wt 74.8 kg   SpO2 99%   BMI 32.22 kg/m   Physical Exam  Constitutional: She is oriented to person, place, and time. She appears well-developed and well-nourished. No distress.  HENT:  Head: Normocephalic and atraumatic.  Eyes: Conjunctivae and EOM are normal.  Cardiovascular: Normal rate and regular rhythm.  Pulmonary/Chest: Effort normal and breath sounds normal. No stridor. No respiratory distress.  Abdominal: She exhibits no distension.  Musculoskeletal: She exhibits no edema.  Neurological: She is alert and oriented to person, place, and time. She displays atrophy. She displays no tremor. No cranial nerve deficit or sensory deficit. She exhibits normal muscle tone. She displays no seizure activity. Coordination normal.  With quick lateral head rotation the patient has provoked mild dizziness, though no nausea, no vomiting.  Skin: Skin is warm and dry.  Psychiatric: She has a normal mood and affect.  Nursing note and vitals reviewed.    ED Treatments / Results  Labs (all labs ordered are listed, but only abnormal results are displayed) Labs Reviewed  CBC - Abnormal; Notable for the following components:      Result Value   Platelets 146 (*)    All other components within normal limits  COMPREHENSIVE METABOLIC PANEL - Abnormal; Notable for the following components:   Potassium 2.9 (*)    Glucose, Bld 122 (*)    Calcium 8.6 (*)    All other components within normal limits  URINALYSIS, ROUTINE W REFLEX MICROSCOPIC - Abnormal; Notable for the following components:   Color, Urine STRAW (*)    Hgb urine dipstick MODERATE (*)    All other components within normal limits  PROTIME-INR  APTT  DIFFERENTIAL  RAPID URINE DRUG SCREEN, HOSP PERFORMED  I-STAT TROPONIN, ED    EKG EKG Interpretation  Date/Time:  Sunday September 02 2018 09:18:25 EST Ventricular Rate:   50 PR Interval:    QRS Duration: 108 QT Interval:  480 QTC Calculation: 438 R Axis:   9 Text Interpretation:  Sinus rhythm Low voltage, precordial leads Nonspecific T abnrm, anterolateral leads Abnormal ekg Confirmed by Carmin Muskrat [phone]) on 09/02/2018 9:31:03 AM   Radiology Ct Head Wo Contrast  Addendum Date: 09/02/2018   ADDENDUM REPORT: 09/02/2018 10:31 ADDENDUM: Also noted are multiple small, superficial linear densities anterior to the right ear, along the posterior margin of the subcutaneous fat at the junction with the underlying musculature. These may represent small surgical clips and are unchanged. Electronically Signed   By: Claudie Revering M.D.   On: 09/02/2018 10:31   Result Date: 09/02/2018 CLINICAL DATA:  Dizziness. Headache. Hallucinations. EXAM: CT HEAD WITHOUT  CONTRAST TECHNIQUE: Contiguous axial images were obtained from the base of the skull through the vertex without intravenous contrast. COMPARISON:  03/10/2015. FINDINGS: Brain: Minimally enlarged ventricles and subarachnoid spaces. Minimal patchy white matter low density in both cerebral hemispheres. No intracranial hemorrhage, mass lesion or CT evidence of acute infarction. Vascular: No hyperdense vessel or unexpected calcification. Skull: Mild bilateral hyperostosis frontalis. Sinuses/Orbits: Status post bilateral cataract extraction. Unremarkable paranasal sinuses. Other: None. IMPRESSION: 1. No acute abnormality. 2. Minimal atrophy and minimal chronic small vessel white matter ischemic changes in both cerebral hemispheres. Electronically Signed: By: Claudie Revering M.D. On: 09/02/2018 10:16   Mr Brain Wo Contrast  Result Date: 09/02/2018 CLINICAL DATA:  84 year old female with ataxia. Dizziness and headache. EXAM: MRI HEAD WITHOUT CONTRAST TECHNIQUE: Multiplanar, multiecho pulse sequences of the brain and surrounding structures were obtained without intravenous contrast. COMPARISON:  Head CT without contrast earlier  today. Brain MRI 03/11/2015 and earlier. FINDINGS: Brain: No restricted diffusion to suggest acute infarction. No midline shift, mass effect, evidence of mass lesion, ventriculomegaly, extra-axial collection or acute intracranial hemorrhage. Cervicomedullary junction and pituitary are within normal limits. Continued mild for age nonspecific cerebral white matter T2 and FLAIR hyperintensity. Patchy T2 hyperintensity in the pons has mildly progressed since 2016, but also fairly mild for age along with stable no cortical encephalomalacia. Stable chronic microhemorrhage in the left occipital lobe. Cerebellum remains normal for age. T2 heterogeneity in the deep gray matter nuclei Vascular: Major intracranial vascular flow voids are stable since 2016. Skull and upper cervical spine: Cervical spine degeneration without visible spinal stenosis. Normal bone marrow signal. Sinuses/Orbits: Stable and negative. Other: Visible internal auditory structures appear grossly stable and normal. Mastoids remain clear. Scalp and face soft tissues are negative. IMPRESSION: 1.  No acute intracranial abnormality. 2. Mild for age signal changes compatible with chronic small vessel disease in the brain, stable to minimally progressed since 2016. Electronically Signed   By: Genevie Ann M.D.   On: 09/02/2018 13:30    Procedures Procedures (including critical care time)  Medications Ordered in ED Medications  sodium chloride 0.9 % bolus 500 mL (500 mLs Intravenous New Bag/Given 09/02/18 0936)    Followed by  0.9 %  sodium chloride infusion (100 mL/hr Intravenous New Bag/Given 09/02/18 0945)  acetaminophen (TYLENOL) tablet 500 mg (500 mg Oral Given 09/02/18 1208)     Initial Impression / Assessment and Plan / ED Course  I have reviewed the triage vital signs and the nursing notes.  Pertinent labs & imaging results that were available during my care of the patient were reviewed by me and considered in my medical decision making (see  chart for details).     12:03 PM Patient patient with ataxic gait, according to her, son, this is abnormal. She does have mild persistent headache, but has no ongoing dizziness, while at rest. Initial findings are generally reassuring, no evidence for mass, hemorrhage, labs generally reassuring as well. With new ataxia, the patient will have MRI.  2:25 PM Patient in no distress, awake, alert, speaking clearly. Headache is improved, dizziness is improved. We discussed findings, including reassuring MRI, no evidence for stroke. Some suspicion for vertigo given the patient's dizziness, without other focal neuro deficits beyond hesitancy with gait, which may be secondary to her vertigo. With improvement here she was discharged with close outpatient follow-up, ongoing meclizine as needed for dizziness, return precautions.  Final Clinical Impressions(s) / ED Diagnoses   Final diagnoses:  Dizziness    ED Discharge  Orders         Ordered    meclizine (ANTIVERT) 12.5 MG tablet  3 times daily PRN     09/02/18 1424           Carmin Muskrat, MD 09/02/18 1426

## 2018-09-24 ENCOUNTER — Other Ambulatory Visit: Payer: Self-pay | Admitting: Internal Medicine

## 2018-09-24 DIAGNOSIS — R921 Mammographic calcification found on diagnostic imaging of breast: Secondary | ICD-10-CM

## 2018-09-24 DIAGNOSIS — N6310 Unspecified lump in the right breast, unspecified quadrant: Secondary | ICD-10-CM

## 2018-10-16 ENCOUNTER — Ambulatory Visit
Admission: RE | Admit: 2018-10-16 | Discharge: 2018-10-16 | Disposition: A | Discharge status: Home / Self Care | Payer: Medicare HMO | Source: Ambulatory Visit | Attending: Internal Medicine | Admitting: Internal Medicine

## 2018-10-16 DIAGNOSIS — R921 Mammographic calcification found on diagnostic imaging of breast: Secondary | ICD-10-CM

## 2018-10-16 DIAGNOSIS — N6310 Unspecified lump in the right breast, unspecified quadrant: Secondary | ICD-10-CM

## 2018-11-06 ENCOUNTER — Other Ambulatory Visit: Payer: Self-pay | Admitting: Internal Medicine

## 2018-12-12 ENCOUNTER — Encounter: Payer: Self-pay | Admitting: Internal Medicine

## 2018-12-12 ENCOUNTER — Ambulatory Visit (INDEPENDENT_AMBULATORY_CARE_PROVIDER_SITE_OTHER): Payer: Medicare HMO | Admitting: Internal Medicine

## 2018-12-12 VITALS — BP 116/74 | HR 66 | Temp 97.9°F | Ht 63.6 in | Wt 166.2 lb

## 2018-12-12 DIAGNOSIS — H9190 Unspecified hearing loss, unspecified ear: Secondary | ICD-10-CM

## 2018-12-12 DIAGNOSIS — I10 Essential (primary) hypertension: Secondary | ICD-10-CM

## 2018-12-12 DIAGNOSIS — E039 Hypothyroidism, unspecified: Secondary | ICD-10-CM | POA: Diagnosis not present

## 2018-12-12 DIAGNOSIS — R413 Other amnesia: Secondary | ICD-10-CM

## 2018-12-12 DIAGNOSIS — D472 Monoclonal gammopathy: Secondary | ICD-10-CM

## 2018-12-12 NOTE — Patient Instructions (Signed)
Dementia  Dementia means losing some of your brain ability. People with dementia may have problems with:  · Memory.  · Making decisions.  · Behavior.  · Speaking.  · Thinking.  · Solving problems.  Follow these instructions at home:  Medicine  · Take over-the-counter and prescription medicines only as told by your doctor.  · Avoid taking medicines that can change how you think. These include pain or sleeping medicines.  Lifestyle    · Make healthy choices:  ? Be active as told by your doctor.  ? Do not use any tobacco products, such as cigarettes, chewing tobacco, and e-cigarettes. If you need help quitting, ask your doctor.  ? Eat a healthy diet.  ? When you get stressed, do something to help yourself relax. Your doctor can give you tips.  ? Spend time with other people.  · Drink enough fluid to keep your pee (urine) clear or pale yellow.  · Make sure you get good sleep. Use these tips to help you get a good night's rest:  ? Try not to take naps during the day.  ? Keep your sleeping area dark and cool.  ? In the few hours before you go to bed, try not to do any exercise.  ? Try not to have foods and drinks with caffeine in the evening.  General instructions  · Talk with your doctor to figure out:  ? What you need help with.  ? What your safety needs are.  · If you were given a bracelet that tracks your location, make sure to wear it.  · Keep all follow-up visits as told by your doctor. This is important.  Contact a doctor if:  · You have any new problems.  · You have problems with choking or swallowing.  · You have any symptoms of a different sickness.  Get help right away if:  · You have a fever.  · You feel mixed up (confused) or more mixed up than before.  · You have new sleepiness.  · You have sleepiness that gets worse.  · You have a hard time staying awake.  · You or your family members are worried for your safety.  This information is not intended to replace advice given to you by your health care  provider. Make sure you discuss any questions you have with your health care provider.  Document Released: 09/15/2008 Document Revised: 03/10/2016 Document Reviewed: 07/01/2015  Elsevier Interactive Patient Education © 2019 [employer] Inc[employer]

## 2018-12-13 LAB — CMP14+EGFR
A/G RATIO: 1.5 (ref 1.2–2.2)
ALK PHOS: 66 IU/L (ref 39–117)
ALT: 10 IU/L (ref 0–32)
AST: 21 IU/L (ref 0–40)
Albumin: 4.4 g/dL (ref 3.6–4.6)
BILIRUBIN TOTAL: 0.2 mg/dL (ref 0.0–1.2)
BUN/Creatinine Ratio: 15 (ref 12–28)
BUN: 15 mg/dL (ref 8–27)
CHLORIDE: 100 mmol/L (ref 96–106)
CO2: 27 mmol/L (ref 20–29)
Calcium: 9.3 mg/dL (ref 8.7–10.3)
Creatinine, Ser: 1 mg/dL (ref 0.57–1.00)
GFR calc Af Amer: 60 mL/min/{1.73_m2} (ref >59)
GFR, EST NON AFRICAN AMERICAN: 52 mL/min/{1.73_m2} — AB (ref >59)
GLOBULIN, TOTAL: 2.9 g/dL (ref 1.5–4.5)
Glucose: 87 mg/dL (ref 65–99)
POTASSIUM: 3.7 mmol/L (ref 3.5–5.2)
SODIUM: 142 mmol/L (ref 134–144)
Total Protein: 7.3 g/dL (ref 6.0–8.5)

## 2018-12-13 LAB — T4, FREE: Free T4: 0.68 ng/dL — ABNORMAL LOW (ref 0.82–1.77)

## 2018-12-13 LAB — LIPID PANEL
CHOL/HDL RATIO: 3.1 ratio (ref 0.0–4.4)
CHOLESTEROL TOTAL: 220 mg/dL — AB (ref 100–199)
HDL: 71 mg/dL (ref >39)
LDL Calculated: 113 mg/dL — ABNORMAL HIGH (ref 0–99)
TRIGLYCERIDES: 180 mg/dL — AB (ref 0–149)
VLDL Cholesterol Cal: 36 mg/dL (ref 5–40)

## 2018-12-13 LAB — RPR, QUANT+TP ABS (REFLEX)
Rapid Plasma Reagin, Quant: 1:2 {titer} — ABNORMAL HIGH
T Pallidum Abs: NONREACTIVE

## 2018-12-13 LAB — VITAMIN B12: Vitamin B-12: 541 pg/mL (ref 232–1245)

## 2018-12-13 LAB — TSH: TSH: 53.23 u[IU]/mL — ABNORMAL HIGH (ref 0.450–4.500)

## 2018-12-13 LAB — RPR: RPR Ser Ql: REACTIVE — AB

## 2018-12-15 ENCOUNTER — Other Ambulatory Visit: Payer: Self-pay | Admitting: Internal Medicine

## 2018-12-15 DIAGNOSIS — E039 Hypothyroidism, unspecified: Secondary | ICD-10-CM

## 2018-12-15 DIAGNOSIS — H9190 Unspecified hearing loss, unspecified ear: Secondary | ICD-10-CM

## 2018-12-15 DIAGNOSIS — R413 Other amnesia: Secondary | ICD-10-CM

## 2018-12-16 NOTE — Progress Notes (Signed)
Subjective:     Patient ID: Brianna [name] , female    DOB: 1933-02-21 , 85 y.o.   MRN: [id number]   Chief Complaint  Patient presents with  . Hypertension    HPI  Hypertension  This is a chronic problem. The current episode started more than 1 year ago. The problem has been gradually improving since onset. The problem is controlled. Pertinent negatives include no blurred vision, chest pain, palpitations or shortness of breath. Risk factors for coronary artery disease include post-menopausal state and sedentary lifestyle.     Past Medical History:  Diagnosis Date  . Headache 12/24/2013  . Headache(784.0)   . Hypertension    pcp  preston clark  . Hypothyroid   . MGUS (monoclonal gammopathy of unknown significance) 12/10/2013     Family History  Problem Relation Age of Onset  . Cancer Maternal Grandfather        stomach cancer  . Cancer Paternal Grandfather        stomach cancer  . Healthy Mother   . Healthy Father      Current Outpatient Medications:  .  amLODipine (NORVASC) 5 MG tablet, Take 5 mg by mouth daily., Disp: , Rfl:  .  aspirin 81 MG tablet, Take 81 mg by mouth daily., Disp: , Rfl:  .  Cholecalciferol (VITAMIN D3) 125 MCG (5000 UT) CAPS, Take 5,000 capsules by mouth daily. , Disp: , Rfl:  .  fish oil-omega-3 fatty acids 1000 MG capsule, Take 1 g by mouth daily., Disp: , Rfl:  .  furosemide (LASIX) 40 MG tablet, Take 40 mg by mouth daily as needed for fluid or edema., Disp: , Rfl:  .  meclizine (ANTIVERT) 12.5 MG tablet, Take 1 tablet (12.5 mg total) by mouth 3 (three) times daily as needed for dizziness., Disp: 21 tablet, Rfl: 0 .  Multiple Vitamin (MULTIVITAMIN WITH MINERALS) TABS, Take 1 tablet by mouth daily. , Disp: , Rfl:  .  SYNTHROID 125 MCG tablet, TAKE 1 TABLET BY MOUTH ON ODD DAYS ONLY, Disp: 60 tablet, Rfl: 3   Allergies  Allergen Reactions  . Iodine Other (See Comments)    Reaction unknown  . Sulfa Antibiotics Other (See Comments)   Childhood reaction     Review of Systems  Constitutional: Negative.   Eyes: Negative for blurred vision.  Respiratory: Negative.  Negative for shortness of breath.   Cardiovascular: Negative.  Negative for chest pain and palpitations.  Gastrointestinal: Negative.   Neurological: Negative.   Psychiatric/Behavioral: Negative.      Today's Vitals   12/12/18 1143  BP: 116/74  Pulse: 66  Temp: 97.9 F (36.6 C)  TempSrc: Oral  Weight: 166 lb 3.2 oz (75.4 kg)  Height: 5' 3.6" (1.615 m)  PainSc: 0-No pain   Body mass index is 28.89 kg/m.   Objective:  Physical Exam Vitals signs and nursing note reviewed.  Constitutional:      Appearance: Normal appearance.  HENT:     Head: Normocephalic and atraumatic.  Cardiovascular:     Rate and Rhythm: Normal rate and regular rhythm.     Heart sounds: Normal heart sounds.  Pulmonary:     Effort: Pulmonary effort is normal.     Breath sounds: Normal breath sounds.  Skin:    General: Skin is warm.  Neurological:     General: No focal deficit present.     Mental Status: She is alert.  Psychiatric:        Mood and Affect: Mood  normal.        Behavior: Behavior normal.         Assessment And Plan:     1. Essential hypertension, benign  Well controlled. She will continue with current meds. She is encouraged to avoid adding salt to her foods.   - CMP14+EGFR - Lipid panel  2. Primary hypothyroidism  I will check thyroid panel and adjust meds as needed.  - TSH - T4, Free  3. MGUS (monoclonal gammopathy of unknown significance)  Chronic, yet stable. As per hematology.   4. Memory loss  I will check labs as listed below. She is agreeable to syphilis testing. Nurse CCM and Social work CCM have also been consulted.   - Vitamin B12 - RPR - RPR, quant & T.pallidum antibodies  5. Hearing loss, unspecified hearing loss type, unspecified laterality  She has been evaluated by ENT. This can also contribute to her cognitive  impairment.   Maximino Greenland, MD

## 2018-12-19 ENCOUNTER — Ambulatory Visit: Payer: Self-pay

## 2018-12-19 DIAGNOSIS — I10 Essential (primary) hypertension: Secondary | ICD-10-CM

## 2018-12-19 DIAGNOSIS — H9190 Unspecified hearing loss, unspecified ear: Secondary | ICD-10-CM

## 2018-12-19 DIAGNOSIS — E039 Hypothyroidism, unspecified: Secondary | ICD-10-CM

## 2018-12-19 DIAGNOSIS — R413 Other amnesia: Secondary | ICD-10-CM

## 2018-12-19 NOTE — Chronic Care Management (AMB) (Signed)
  Care Management Note   Brianna [name] is a 85 y.o. year old female who is a primary care patient of [name], MD . [name] Cancer asked the CM team to consult with the patient for assistance with medication compliance, disease education, and community resource needs.  Review of patient status, including review of consultants reports, rand collaboration with appropriate care team members and the patient's provider was performed as part of comprehensive patient evaluation and provision of chronic care management services. Telephone outreach to patient today to introduce CCM services.   I reached out to [employer] by phone today.   [name] was given information about Chronic Care Management services today including:  1. CCM service includes personalized support from designated clinical staff supervised by her physician, including individualized plan of care and coordination with other care providers 2. 24/7 contact phone numbers for assistance for urgent and routine care needs. 3. Service will only be billed when office clinical staff spend 20 minutes or more in a month to coordinate care. 4. Only one practitioner may furnish and bill the service in a calendar month. 5. The patient may stop CCM services at any time (effective at the end of the month) by phone call to the office staff. 6. The patient will be responsible for cost sharing (co-pay) of up to 20% of the service fee (after annual deductible is met).   Patient agreed to services and verbal consent obtained.    Follow Up Plan: Face to Face appointment with CCM team member scheduled for: Thursday March 12th.   Daneen Schick, BSW, CDP [facility] Care Management Social Worker [phone]  Total time spent performing care coordination and/or care management activities with the patient by phone or face to face = 15 minutes.

## 2018-12-19 NOTE — Patient Instructions (Signed)
Social Worker Visit Information  Materials provided: Verbal education about chronic care management provided by phone  [name] was given information about Chronic Care Management services today including:  1. CCM service includes personalized support from designated clinical staff supervised by her physician, including individualized plan of care and coordination with other care providers 2. 24/7 contact phone numbers for assistance for urgent and routine care needs. 3. Service will only be billed when office clinical staff spend 20 minutes or more in a month to coordinate care. 4. Only one practitioner may furnish and bill the service in a calendar month. 5. The patient may stop CCM services at any time (effective at the end of the month) by phone call to the office staff. 6. The patient will be responsible for cost sharing (co-pay) of up to 20% of the service fee (after annual deductible is met).  Patient agreed to services and verbal consent obtained.   The patient verbalized understanding of instructions provided today and declined a print copy of patient instruction materials.   Follow up plan: Face to Face appointment with CCM team member scheduled for: Thursday March 12th   Brianna [name], [place], CDP [facility] Care Management Social Worker [phone]

## 2018-12-22 ENCOUNTER — Other Ambulatory Visit: Payer: Self-pay | Admitting: Internal Medicine

## 2018-12-24 ENCOUNTER — Other Ambulatory Visit: Payer: Self-pay

## 2018-12-27 ENCOUNTER — Other Ambulatory Visit: Payer: Self-pay | Admitting: Internal Medicine

## 2018-12-27 ENCOUNTER — Ambulatory Visit (INDEPENDENT_AMBULATORY_CARE_PROVIDER_SITE_OTHER): Payer: Medicare HMO

## 2018-12-27 ENCOUNTER — Other Ambulatory Visit: Payer: Self-pay

## 2018-12-27 ENCOUNTER — Ambulatory Visit: Payer: Self-pay

## 2018-12-27 DIAGNOSIS — R413 Other amnesia: Secondary | ICD-10-CM

## 2018-12-27 DIAGNOSIS — E039 Hypothyroidism, unspecified: Secondary | ICD-10-CM

## 2018-12-27 DIAGNOSIS — I10 Essential (primary) hypertension: Secondary | ICD-10-CM | POA: Diagnosis not present

## 2018-12-27 DIAGNOSIS — H9190 Unspecified hearing loss, unspecified ear: Secondary | ICD-10-CM

## 2018-12-27 NOTE — Chronic Care Management (AMB) (Signed)
Chronic Care Management    Clinical Social Work General Note  12/27/2018 Name: Brianna Brianna [name] MRN: [id number] DOB: [birth year]  Brianna Brianna [name] is a 85 y.o. year old female who is a primary care Brianna [name] of [name], MD. Brianna CCM was consulted to assist Brianna Brianna [name] with [employer].   Brianna Brianna [name] was given information about Chronic Care Management services today including:  1. CCM service includes personalized support from designated clinical staff supervised by her physician, including individualized plan of care and coordination with other care providers 2. 24/7 contact phone numbers for assistance for urgent and routine care needs. 3. Service will only be billed when office clinical staff spend 20 minutes or more in a month to coordinate care. 4. Only one practitioner may furnish and bill Brianna service in a calendar month. 5. Brianna Brianna [name] may stop CCM services at any time (effective at Brianna end of Brianna month) by phone call to Brianna office staff. 6. Brianna Brianna [name] will be responsible for cost sharing (co-pay) of up to 20% of Brianna service fee (after annual deductible is met).  Brianna [name] agreed to services and verbal consent obtained.   Review of Brianna [name] status, including review of consultants reports, relevant laboratory and other test results, and collaboration with appropriate care team members and Brianna Brianna [name]'s provider was performed as part of comprehensive Brianna [name] evaluation and provision of chronic care management services.    SDOH (Social Determinants of Health) screening performed today. See Care Plan Entry related to challenges with: Social Connections.   Goals Addressed            This Visit's Progress     Brianna [name] Stated   . "I want to name a power of attorney" (pt-stated)       Current Barriers:  . Limited education about Brianna importance of an advance directive and what a healthcare power of attorney is for . Memory Deficits  Clinical Social Work Clinical Goal(s):  [name]  Over Brianna next 30 days, Brianna [name] will work with SW to address needs related to completing an advance directive  Interventions: . Brianna [name] interviewed and appropriate assessments performed . Discussed plans with Brianna [name] for ongoing care management follow up and provided Brianna [name] with direct contact information for care management team . Provided education and assistance to client regarding Advanced Directives.  . Provided advance directive packet to Brianna Brianna [name] and encouraged her to review and discuss with her son, Brianna Brianna [name]., whom she plans to appoint as her power of attorney . Scheduled follow up appointment with Brianna Brianna [name] for Tuesday March 17th to meet face to face with CCM team. Brianna Brianna [name] will plan to attend with her son.  Brianna [name] Self Care Activities:  . Self administers medications as prescribed . Attends all scheduled provider appointments . Calls pharmacy for medication refills . Performs ADL's independently . Calls provider office for new concerns or questions  Initial goal documentation     . "my memory is changing" (pt-stated)       Current Barriers:  . Social Isolation . Memory Deficits . Lacks knowledge of signs and symptoms of dementia. Brianna [name] reports beginning to become lost when driving home.  "Yesterday I got lost on my way home. I had to pull over and think for a few minutes about where I was going"  Clinical Social Work Clinical Goal(s):  [name] Over Brianna next 30 days, Brianna [name] will follow up with neurology  to address needs related to cognitive decline  Interventions: . Brianna [name] interviewed and appropriate  assessments performed . Discussed plans with Brianna [name] for ongoing care management follow up and provided Brianna [name] with direct contact information for care management team . Collaborated with primary care provider re: patients reported cognitive changes and agreement to see a neurologist . SW educated Brianna Brianna [name] on Brianna functions a neurologist may assist with. Brianna Brianna [name]  is agreeable to a referral  Brianna [name] Self Care Activities:  . Self administers medications as prescribed . Attends all scheduled provider appointments . Calls pharmacy for medication refills . Performs ADL's independently . Performs IADL's independently  Initial goal documentation          Follow Up Plan: Appointment scheduled for SW to meet with client in provider office on: Tuesday March 17       Brianna Brianna [name], [place], CDP Brianna Brianna [name] / [facility] Care Management Social Worker [phone]  Total time spent performing care coordination and/or care management activities with Brianna Brianna [name] by phone or face to face = 47 minutes.

## 2018-12-27 NOTE — Patient Instructions (Signed)
Social Worker Visit Information  Goals we discussed today:  Goals Addressed            This Visit's Progress     Patient Stated   . "I want to name a power of attorney" (pt-stated)       Current Barriers:  . Limited education about the importance of an advance directive and what a healthcare power of attorney is for . Memory Deficits  Clinical Social Work Clinical Goal(s):  [name] Over the next 30 days, patient will work with SW to address needs related to completing an advance directive  Interventions: . Patient interviewed and appropriate assessments performed . Discussed plans with patient for ongoing care management follow up and provided patient with direct contact information for care management team . Provided education and assistance to client regarding Advanced Directives.  . Provided advance directive packet to the patient and encouraged her to review and discuss with her son, [name]., whom she plans to appoint as her power of attorney . Scheduled follow up appointment with the patient for Tuesday March 17th to meet face to face with CCM team. The patient will plan to attend with her son.  Patient Self Care Activities:  . Self administers medications as prescribed . Attends all scheduled provider appointments . Calls pharmacy for medication refills . Performs ADL's independently . Calls provider office for new concerns or questions  Initial goal documentation     . "my memory is changing" (pt-stated)       Current Barriers:  . Social Isolation . Memory Deficits . Lacks knowledge of signs and symptoms of dementia. Patient reports beginning to become lost when driving home.  "Yesterday I got lost on my way home. I had to pull over and think for a few minutes about where I was going"  Clinical Social Work Clinical Goal(s):  [name] Over the next 30 days, patient will follow up with neurology  to address needs related to cognitive decline  Interventions: . Patient  interviewed and appropriate assessments performed . Discussed plans with patient for ongoing care management follow up and provided patient with direct contact information for care management team . Collaborated with primary care provider re: patients reported cognitive changes and agreement to see a neurologist . SW educated the patient on the functions a neurologist may assist with. The patient is agreeable to a referral  Patient Self Care Activities:  . Self administers medications as prescribed . Attends all scheduled provider appointments . Calls pharmacy for medication refills . Performs ADL's independently . Performs IADL's independently  Initial goal documentation         Materials provided: Yes: SW provided the patient with an advance directive packet  Ms. Raigoza was given information about Chronic Care Management services today including:  1. CCM service includes personalized support from designated clinical staff supervised by her physician, including individualized plan of care and coordination with other care providers 2. 24/7 contact phone numbers for assistance for urgent and routine care needs. 3. Service will only be billed when office clinical staff spend 20 minutes or more in a month to coordinate care. 4. Only one practitioner may furnish and bill the service in a calendar month. 5. The patient may stop CCM services at any time (effective at the end of the month) by phone call to the office staff. 6. The patient will be responsible for cost sharing (co-pay) of up to 20% of the service fee (after annual deductible is met).  Patient  agreed to services and verbal consent obtained.   The patient verbalized understanding of instructions provided today and declined a print copy of patient instruction materials.   Follow up plan: Appointment scheduled for SW to meet with client in provider office on: Tuesday March 17    , [place], [place] [facility] / [place]  Management Social Worker [phone]

## 2018-12-27 NOTE — Chronic Care Management (AMB) (Signed)
Chronic Care Management   Initial Visit Note  12/27/2018 Name: Brianna [name] MRN: [id number] DOB: Sep 22, 1933  Referred by: [name], MD Reason for referral : Chronic Care Management (INITIAL CCM FACE TO FACE VISIT)   Brianna [name] is a 85 y.o. year old female who is a primary care patient of [name], MD. The CCM team was consulted for assistance with chronic disease management and care coordination needs.   Review of patient status, including review of consultants reports, relevant laboratory and other test results, and collaboration with appropriate care team members and the patient's provider was performed as part of comprehensive patient evaluation and provision of chronic care management services.    The CCM team met with Brianna [name] today in the office for her initial Face to Face visit.   Objective:  Lab Results  Component Value Date   HGBA1C (H) 04/16/2010    6.2 (NOTE)                                                                       According to the ADA Clinical Practice Recommendations for 2011, when HbA1c is used as a screening test:   >=6.5%   Diagnostic of Diabetes Mellitus           (if abnormal result  is confirmed)  5.7-6.4%   Increased risk of developing Diabetes Mellitus  References:Diagnosis and Classification of Diabetes Mellitus,Diabetes EYCX,4481,85(UDJSH 1):S62-S69 and Standards of Medical Care in         Diabetes - 2011,Diabetes Care,2011,34  (Suppl 1):S11-S61.   Lab Results  Component Value Date   LDLCALC 113 (H) 12/12/2018   CREATININE 1.00 12/12/2018   BP Readings from Last 3 Encounters:  12/12/18 116/74  09/02/18 (!) 129/56  01/20/16 128/65    Goals Addressed    Patient Stated   . "I get confused on how to take my thyroid medication" (pt-stated)       Current Barriers:  Brianna [name] Knowledge Deficit related to Hypothyroidism  . Impaired memory  Nurse Case Manager Clinical Goal(s):  Brianna [name] Over the next 90 days, patients thyroid levels  will be within normal range.  Interventions:   Face to Face visit completed with patient . Evaluation of current treatment plan related Hypothyroidism and patient's adherence to plan as established by provider . Advised patient to take her current dose of Synthroid but increasing to 1 tablet daily except on Sundays . Provided education to patient re: abnormal thyroid levels and potential complications if left untreated . Collaborated with [name] Cancer regarding dosage adjustment with patient's Synthroid . Provided RNCM contact #; provided 24/7 nurse advice line phone # . Scheduled telephone follow with patient/sons  Patient Self Care Activities:   Verbalizes understanding of the education/information provided today  Self administers medications as prescribed . Drives self and attends all scheduled provider appointments . Calls pharmacy for medication refills . Performs ADL's independently . Calls provider office for new concerns or questions   Initial goal documentation     . "I haven't received a call from the health department" (pt-stated)       Current Barriers:  Brianna [name] Knowledge Deficits related to disease process of Syphilis; treatment options   Impaired Memory  Nurse Case Manager Clinical  Goal(s):  Brianna [name] Over the next 30 days, patient will work with the [facility] Department to address needs related to recommendations for treatment of Syphilis  Interventions:   Face to Face visit completed with patient today   Collaborated with [name] Cancer  regarding positive Syphilis and recommendations for treatment/follow up with [facility]  Advised patient to she will receive a call from the Health Department to schedule an appointment to discuss treatment options for Syphilis . Provided patient with RNCM contact #; provided [name] 24/7 nurse advice line phone # . Telephone follow up scheduled with patient  Patient Self Care Activities:   Verbalizes understanding of the  education/information provided today  Self administers medications as prescribed . Drives self and attends all scheduled provider appointments . Calls pharmacy for medication refills . Performs ADL's independently . Calls provider office for new concerns or questions  Initial goal documentation     . "I pretty much eat whatever I want" (pt-stated)       Current Barriers:  Brianna [name] Knowledge Deficits related to disease process and treament for Hyperlipidemia   Nurse Case Manager Clinical Goal(s):   Over the next 30 days, patient will report adhering to a heart healthy diet (low cholesterol, low fat, low sodium)  Interventions:   Face to Face CCM visit completed with patient . Evaluation of current treatment plan related to Hyperlipidemia and patient's adherence to plan as established by provider. . Advised patient to elevate her lower extremities when resting to help reduce edema; follow a low sodium diet and take her fluid pill every day . Provided education to patient re: cause of, treatment for and potential complicatons related to Hyperlipidemia . Collaborated with [name] Cancer via in basket message  regarding patient is not currently prescribed pharmacological treatment for her Hyperlipidemia  . Provided patient with written instructions related to eating a low sodium diet and how to read labels . Provided patient with RNCM contact #; provided [facility] 24/7 nurse advice line phone # . Scheduled telephone CCM follow up call with patient   Patient Self Care Activities:   Verbalizes understanding of the education/information provided today  Self administers medications as prescribed . Drives self and attends all scheduled provider appointments . Calls pharmacy for medication refills . Performs ADL's independently . Calls provider office for new concerns or questions   Initial goal documentation      Brianna [name] was given information about Chronic Care Management services today  including:  1. CCM service includes personalized support from designated clinical staff supervised by her physician, including individualized plan of care and coordination with other care providers 2. 24/7 contact phone numbers for assistance for urgent and routine care needs. 3. Service will only be billed when office clinical staff spend 20 minutes or more in a month to coordinate care. 4. Only one practitioner may furnish and bill the service in a calendar month. 5. The patient may stop CCM services at any time (effective at the end of the month) by phone call to the office staff. 6. The patient will be responsible for cost sharing (co-pay) of up to 20% of the service fee (after annual deductible is met).  Patient agreed to services and verbal consent obtained.   The CM team will reach out to the patient again over the next 1-2 weeks.   Barb Merino, RN,CCM Care Management Coordinator [place] [facility]  Direct Phone: [phone]

## 2018-12-28 NOTE — Patient Instructions (Signed)
Visit Information  Goals Addressed    Patient Stated   . "I get confused on how to take my thyroid medication" (pt-stated)       Current Barriers:  [name] Knowledge Deficit related to Hypothyroidism  . Impaired memory  Nurse Case Manager Clinical Goal(s):  [name] Over the next 90 days, patients thyroid levels will be within normal range.  Interventions:   Face to Face visit completed with patient . Evaluation of current treatment plan related Hypothyroidism and patient's adherence to plan as established by provider . Advised patient to take her current dose of Synthroid but increasing to 1 tablet daily except on Sundays . Provided education to patient re: abnormal thyroid levels and potential complications if left untreated . Collaborated with [name] Cancer regarding dosage adjustment with patient's Synthroid . Provided RNCM contact #; provided 24/7 nurse advice line phone # . Scheduled telephone follow with patient/sons  Patient Self Care Activities:   Verbalizes understanding of the education/information provided today  Self administers medications as prescribed . Drives self and attends all scheduled provider appointments . Calls pharmacy for medication refills . Performs ADL's independently . Calls provider office for new concerns or questions   Initial goal documentation    . "I haven't received a call from the health department" (pt-stated)       Current Barriers:  [name] Knowledge Deficits related to disease process of Syphilis; treatment options   Impaired Memory  Nurse Case Manager Clinical Goal(s):  [name] Over the next 30 days, patient will work with the [facility] Department to address needs related to recommendations for treatment of Syphilis  Interventions:   Face to Face visit completed with patient today   Collaborated with [name] Cancer  regarding positive Syphilis and recommendations for treatment/follow up with [facility]  Advised patient to she will receive a call  from the Health Department to schedule an appointment to discuss treatment options for Syphilis . Provided patient with RNCM contact #; provided [facility] 24/7 nurse advice line phone # . Telephone follow up scheduled with patient  Patient Self Care Activities:   Verbalizes understanding of the education/information provided today  Self administers medications as prescribed . Drives self and attends all scheduled provider appointments . Calls pharmacy for medication refills . Performs ADL's independently . Calls provider office for new concerns or questions  Initial goal documentation    . "I pretty much eat whatever I want" (pt-stated)       Current Barriers:  [name] Knowledge Deficits related to disease process and treament for Hyperlipidemia   Nurse Case Manager Clinical Goal(s):   Over the next 30 days, patient will report adhering to a heart healthy diet (low cholesterol, low fat, low sodium)  Interventions:   Face to Face CCM visit completed with patient . Evaluation of current treatment plan related to Hyperlipidemia and patient's adherence to plan as established by provider. . Advised patient to elevate her lower extremities when resting to help reduce edema; follow a low sodium diet and take her fluid pill every day . Provided education to patient re: cause of, treatment for and potential complicatons related to Hyperlipidemia . Collaborated with [name] Cancer via in basket message  regarding patient is not currently prescribed pharmacological treatment for her Hyperlipidemia  . Provided patient with written instructions related to eating a low sodium diet and how to read labels . Provided patient with RNCM contact #; provided [facility] 24/7 nurse advice line phone # . Scheduled telephone CCM follow up call  with patient   Patient Self Care Activities:   Verbalizes understanding of the education/information provided today  Self administers medications as prescribed . Drives self and  attends all scheduled provider appointments . Calls pharmacy for medication refills . Performs ADL's independently . Calls provider office for new concerns or questions   Initial goal documentation      The patient verbalized understanding of instructions provided today and declined a print copy of patient instruction materials.   The CM team will reach out to the patient again over the next 1-2 weeks.   Barb Merino, RN,CCM Care Management Coordinator [place] [facility]  Direct Phone: [phone]

## 2018-12-31 ENCOUNTER — Ambulatory Visit: Payer: Self-pay

## 2018-12-31 NOTE — Chronic Care Management (AMB) (Signed)
  Chronic Care Management   Social Work Note  12/31/2018 Name: RANIYA GOLEMBESKI MRN: [id number] DOB: 1932/10/18  SW attempted to outreach patient today via phone to reschedule face to face visit arranged for Tuesday 3/17. SW placed an unsuccessful call to the patients home number. Unfortunately, the phone rang several times with no voicemail established. SW also attempted to outreach the patients son, [name]., considering the patient planned to have him attend the appointment. This call was also unsuccessful. SW left a HIPAA compliant voice message requesting a return call.  Follow Up Plan: SW will follow up with patient by phone over the next day  Nadara Eaton, CDP [facility] / [facility] Care Management Social Worker [phone]  Total time spent performing care coordination and/or care management activities with the patient by phone or face to face = 5 minutes.

## 2019-01-01 ENCOUNTER — Ambulatory Visit: Payer: Self-pay

## 2019-01-01 ENCOUNTER — Telehealth: Payer: Self-pay

## 2019-01-01 DIAGNOSIS — R413 Other amnesia: Secondary | ICD-10-CM

## 2019-01-01 DIAGNOSIS — E039 Hypothyroidism, unspecified: Secondary | ICD-10-CM

## 2019-01-01 DIAGNOSIS — I10 Essential (primary) hypertension: Secondary | ICD-10-CM

## 2019-01-01 NOTE — Patient Instructions (Signed)
Social Worker Visit Information  Goals we discussed today:  Goals Addressed            This Visit's Progress     Patient Stated   . "my memory is changing" (pt-stated)       Current Barriers:  . Social Isolation . Memory Deficits . Lacks knowledge of signs and symptoms of dementia. Patient reports beginning to become lost when driving home.  "Yesterday I got lost on my way home. I had to pull over and think for a few minutes about where I was going"  Clinical Social Work Clinical Goal(s):  [name] Over the next 30 days, patient will follow up with neurology  to address needs related to cognitive decline  Interventions: . Patient interviewed and appropriate assessments performed . Discussed plans with patient for ongoing care management follow up and provided patient with direct contact information for care management team . Collaborated with primary care provider re: patients reported cognitive changes and agreement to see a neurologist . SW educated the patient on the functions a neurologist may assist with. The patient is agreeable to a referral . SW reminded the patient this SW had requested her primary provider to place a neurology referral due to reported concerns with memory . SW assessed the patients continued willingness to see a neurologist, the patient reports "I would like to see what your nurse has to say first" . SW interviewed the patient regarding concerns of getting lost since last appointment. The patient stated "It comes and go's" . SW collaborated with CCM RNCM regarding challenges with patients cognition at this time   Patient Self Care Activities:  . The patient attends provider appointments when needed . The patient calls provider office with new concerns  Please see past updates related to this goal by clicking on the "Past Updates" button in the selected goal          Materials Provided: Verbal education about advance directives provided by phone  Follow  Up Plan: SW will follow up with patient by phone over the next two weeks   Daneen Schick, BSW, CDP [facility] / [place] Management Social Worker [phone]

## 2019-01-01 NOTE — Chronic Care Management (AMB) (Signed)
  Chronic Care Management   Social Work Note  01/01/2019 Name: PAIZLEIGH WILDS MRN: [id number] DOB: 04-04-1933  Brianna [name] is a 85 y.o. year old female who sees [name], MD for primary care. The CCM team was consulted for assistance with [employer].   SW outreached the patient to reschedule in person appointment with the patient and her son scheduled for today to complete advance directives. The patient was unable to recall why appointment had been scheduled but reported she did not plan to have her son accompany her. SW reminded the patient of her decision to involve her son, [name]., considering she would like to name him as her health care agent. The patient unclear of what an advance directive is although this SW had a face to face encounter within the last week to review this document. The patient is agreeable to cancel today's appointment and follow up with SW at a later date.  Licensed Clinical Social Worker Visit Information  Goals we discussed today:  Goals Addressed            This Visit's Progress     Patient Stated   . "my memory is changing" (pt-stated)       Current Barriers:  . Social Isolation . Memory Deficits . Lacks knowledge of signs and symptoms of dementia. Patient reports beginning to become lost when driving home.  "Yesterday I got lost on my way home. I had to pull over and think for a few minutes about where I was going"  Clinical Social Work Clinical Goal(s):  [name] Over the next 30 days, patient will follow up with neurology  to address needs related to cognitive decline  Interventions: . Patient interviewed and appropriate assessments performed . Discussed plans with patient for ongoing care management follow up and provided patient with direct contact information for care management team . Collaborated with primary care provider re: patients reported cognitive changes and agreement to see a neurologist . SW educated the patient on  the functions a neurologist may assist with. The patient is agreeable to a referral . SW reminded the patient this SW had requested her primary provider to place a neurology referral due to reported concerns with memory . SW assessed the patients continued willingness to see a neurologist, the patient reports "I would like to see what your nurse has to say first" . SW interviewed the patient regarding concerns of getting lost since last appointment. The patient stated "It comes and go's" . SW collaborated with CCM RNCM regarding challenges with patients cognition at this time   Patient Self Care Activities:  . The patient attends provider appointments when needed . The patient calls provider office with new concerns  Please see past updates related to this goal by clicking on the "Past Updates" button in the selected goal          Follow Up Plan: SW collaborated with CCM RNCM to discuss concerns surrounding patient cognition and memory deficits. SW will work closely with [facility] regarding plan for patient goals.   Daneen Schick, BSW, CDP [facility] / [facility] Care Management Social Worker [phone]  Total time spent performing care coordination and/or care management activities with the patient by phone or face to face = 15 minutes.

## 2019-01-03 ENCOUNTER — Ambulatory Visit: Payer: Self-pay

## 2019-01-03 DIAGNOSIS — E039 Hypothyroidism, unspecified: Secondary | ICD-10-CM

## 2019-01-03 DIAGNOSIS — I10 Essential (primary) hypertension: Secondary | ICD-10-CM

## 2019-01-03 DIAGNOSIS — R413 Other amnesia: Secondary | ICD-10-CM

## 2019-01-03 NOTE — Chronic Care Management (AMB) (Signed)
  Chronic Care Management   Follow Up Note   01/03/2019 Name: Brianna [name] MRN: [id number] DOB: 04/15/1933  Referred by: [name], MD Reason for referral : Chronic Care Management (TELEPHONE CCM FOLLOW UP )   Brianna [name] is a 85 y.o. year old female who is a primary care patient of [name], MD. The CCM team was consulted for assistance with chronic disease management and care coordination needs.    Attempted calls placed to [name] and the patient, were both unsuccessful today.   Goals Addressed      Patient Stated   . "I haven't received a call from the health department" (pt-stated)       Current Barriers:  [name] Knowledge Deficits related to disease process of Syphilis; treatment options   Impaired Memory  Nurse Case Manager Clinical Goal(s):  [name] Over the next 30 days, patient will work with the [facility] Department to address needs related to recommendations for treatment of Syphilis  Interventions:  . Attempted call placed to [facility] Department [phone]) to f/u on recent referral (left a vm requesting a return phone call) . Attempted follow up call to patient (no answer, no voicemail box set up)  Scheduled a second attempt to follow up with patient by telephone in 7-10 days  Patient Self Care Activities:   Verbalizes understanding of the education/information provided today  Self administers medications as prescribed . Drives self and attends all scheduled provider appointments . Calls pharmacy for medication refills . Performs ADL's independently . Calls provider office for new concerns or questions  Initial goal documentation      The CM team will reach out to the patient again over the next 7-10 days.    Barb Merino, RN,CCM Care Management Coordinator [place] [facility]  Direct Phone: [phone]

## 2019-01-07 ENCOUNTER — Telehealth: Payer: Self-pay

## 2019-01-10 ENCOUNTER — Ambulatory Visit: Payer: Self-pay

## 2019-01-10 ENCOUNTER — Other Ambulatory Visit: Payer: Self-pay

## 2019-01-10 ENCOUNTER — Telehealth: Payer: Self-pay

## 2019-01-10 DIAGNOSIS — E039 Hypothyroidism, unspecified: Secondary | ICD-10-CM

## 2019-01-10 DIAGNOSIS — R413 Other amnesia: Secondary | ICD-10-CM

## 2019-01-10 DIAGNOSIS — I10 Essential (primary) hypertension: Secondary | ICD-10-CM

## 2019-01-10 NOTE — Chronic Care Management (AMB) (Signed)
  Chronic Care Management   Social Work Note  01/10/2019 Name: Brianna [name] MRN: [id number] DOB: 17-May-1933  Brianna [name] is a 85 y.o. year old female who sees [name], MD for primary care. The CCM team was consulted for assistance with [employer].   I attempted to outreach the patient to assist with patient stated goal of completing an Advance Directive. Unsuccessful call, no voice mailbox established.  Follow Up Plan: SW will follow up with patient by phone over the next 7 - 10 days  Daneen Schick, BSW, CDP [facility] / [facility] Care Management Social Worker [phone]  Total time spent performing care coordination and/or care management activities with the patient by phone or face to face = 5 minutes.

## 2019-01-10 NOTE — Chronic Care Management (AMB) (Signed)
Chronic Care Management   Follow Up Note   01/10/2019 Name: Brianna [name] MRN: [id number] DOB: [birth year]  Referred by: [name], MD Reason for referral : Chronic Care Management (PROVIDER TELEPHONE OUTREACH [facility]) CCM follow-up with patient)   Brianna [name] is a 85 y.o. year old female who is a primary care patient of [name], MD. The CCM team was consulted for assistance with chronic disease management and care coordination needs.    Review of patient status, including review of consultants reports, relevant laboratory and other test results, and collaboration with appropriate care team members and the patient's provider was performed as part of comprehensive patient evaluation and provision of chronic care management services.    Collaboration with [place] via telephone. The CCM team spoke with [name] today by telephone to address goals and advise of RPR result findings per [place] health department.   Goals Addressed      Patient Stated   . "I get confused on how to take my thyroid medication" (pt-stated)   On track    Current Barriers:  [name] Knowledge Deficit related to Hypothyroidism  . Impaired memory  Nurse Case Manager Clinical Goal(s):  [name] Over the next 90 days, patients thyroid levels will be within normal range.  Interventions:   Telephone outreach to patient for CM follow up . Assessed for adherence to following medication regimen for Synthroid as directed by [name] Cancer . Reinforced importance of taking thyroid medication exactly as prescribed without missed doses for best results and to help avoid potential complications with thyroid dysfunction (pt states she is adhering and is using her pill box to help her remember to take meds, she is taking thyroid medication in the am 1-2 hours before breakfast) . Notified [name] Cancer via in basket message that patient reports adherence with taking her Synthroid as directed and without missed  doses  . Confirmed patient has RN CM phone number and encouraged a call back if needed before next scheduled call . Scheduled collaborative BSW/CCM follow up call with patient for 01/25/19  Patient Self Care Activities:   Verbalizes understanding of the education/information provided today  Self administers medications as prescribed . Drives self and attends all scheduled provider appointments . Calls pharmacy for medication refills . Performs ADL's independently . Calls provider office for new concerns or questions  Please see past updates related to this goal by clicking on the "Past Updates" button in the selected goal     . COMPLETED: "I haven't received a call from the health department" (pt-stated)       Current Barriers:  [name] Knowledge Deficits related to disease process of Syphilis; treatment options   Impaired Memory  Nurse Case Manager Clinical Goal(s):  [name] Over the next 30 days, patient will work with the [facility] Department to address needs related to recommendations for treatment of Syphilis  Interventions:   Collaboration with Grayce Sessions RN with [place] [phone]   Assessed for receipt of patient referral to evaluate elevated RPR  Confirmed patient's results were found to be a biological false positive per state review  Notified patient of results via telephone  Notified [name] Cancer via in basket message of the reported findings per [facility] RN  No further follow up needed for this goal; goal has been met  Patient Self Care Activities:   Verbalizes understanding of the education/information provided today  Self administers medications as prescribed . Drives self and attends all  scheduled provider appointments . Calls pharmacy for medication refills . Performs ADL's independently . Calls provider office for new concerns or questions  Please see past updates related to this goal by clicking on the "Past Updates" button in  the selected goal      Telephone appointment with CCM team scheduled for: 01/25/19  Barb Merino, [facility] Care Management Coordinator [place] [facility]  Direct Phone: [phone]

## 2019-01-11 NOTE — Patient Instructions (Signed)
Visit Information  Goals Addressed            This Visit's Progress     Patient Stated   . "I get confused on how to take my thyroid medication" (pt-stated)   On track    Current Barriers:  [name] Knowledge Deficit related to Hypothyroidism  . Impaired memory  Nurse Case Manager Clinical Goal(s):  [name] Over the next 90 days, patients thyroid levels will be within normal range.  Interventions:   Telephone outreach to patient for CM follow up . Assessed for adherence to following medication regimen for Synthroid as directed by [name] Cancer . Reinforced importance of taking thyroid medication exactly as prescribed without missed doses for best results and to help avoid potential complications with thyroid dysfunction (pt states she is adhering and is using her pill box to help her remember to take meds, she is taking thyroid medication in the am 1-2 hours before breakfast) . Notified [name] Cancer via in basket message that patient reports adherence with taking her Synthroid as directed and without missed doses  . Confirmed patient has RN CM phone number and encouraged a call back if needed before next scheduled call . Scheduled collaborative BSW/CCM follow up call with patient for 01/25/19  Patient Self Care Activities:   Verbalizes understanding of the education/information provided today  Self administers medications as prescribed . Drives self and attends all scheduled provider appointments . Calls pharmacy for medication refills . Performs ADL's independently . Calls provider office for new concerns or questions   Please see past updates related to this goal by clicking on the "Past Updates" button in the selected goal     . COMPLETED: "I haven't received a call from the health department" (pt-stated)       Current Barriers:  [name] Knowledge Deficits related to disease process of Syphilis; treatment options   Impaired Memory  Nurse Case Manager Clinical Goal(s):  [name] Over the next 30  days, patient will work with the [facility] Department to address needs related to recommendations for treatment of Syphilis  Interventions:   Collaboration with Grayce Sessions RN with [place] [phone]   Assessed for receipt of patient referral to evaluate elevated RPR  Confirmed patient's results were found to be a biological false positive per state review  Notified patient of results via telephone  Notified [name] Cancer via in basket message of the reported findings per [facility] RN  No further follow up needed for this goal; goal has been met  Patient Self Care Activities:   Verbalizes understanding of the education/information provided today  Self administers medications as prescribed . Drives self and attends all scheduled provider appointments . Calls pharmacy for medication refills . Performs ADL's independently . Calls provider office for new concerns or questions  Please see past updates related to this goal by clicking on the "Past Updates" button in the selected goal      The patient verbalized understanding of instructions provided today and declined a print copy of patient instruction materials.   Telephone appointment with CCM team scheduled for: 01/25/19  Barb Merino, [facility] Care Management Coordinator [place] [facility]  Direct Phone: [phone]

## 2019-01-11 NOTE — Chronic Care Management (AMB) (Signed)
  Chronic Care Management    Clinical Social Work Follow Up Note  01/11/2019 Name: Brianna [name] MRN: [id number] DOB: [birth year]  Brianna [name] is a 85 y.o. year old female who is a primary care patient of Brianna Chard, MD. The CCM team was consulted for assistance with care coordination.   Review of patient status, including review of consultants reports, other relevant assessments, and collaboration with appropriate care team members and the patient's provider was performed as part of comprehensive patient evaluation and provision of chronic care management services.     I received an incoming call from the patient and CCM RN CM.   Goals Addressed            This Visit's Progress     Patient Stated   . "I want to name a power of attorney" (pt-stated)       Current Barriers:  . Limited education about the importance of an advance directive and what a healthcare power of attorney is for . Memory Deficits  Clinical Social Work Clinical Goal(s):  [name] Over the next 30 days, patient will work with SW to address needs related to completing an advance directive  Interventions: . Collaboration call with CCM RN Case Manager and the patient . Educated the patient the importance of advance directive completion . Scheduled call to the patient to review information on April 10th . Mailed an advance directive packet to the patient  Patient Self Care Activities:  . Self administers medications as prescribed . Attends all scheduled provider appointments . Calls pharmacy for medication refills . Performs ADL's independently . Calls provider office for new concerns or questions  Initial goal documentation     . "my memory is changing" (pt-stated)       Current Barriers:  . Social Isolation . Memory Deficits . Lacks knowledge of signs and symptoms of dementia. Patient reports beginning to become lost when driving home.  "Yesterday I got lost on my way home. I had to pull over and  think for a few minutes about where I was going"  Clinical Social Work Clinical Goal(s):  [name] Over the next 30 days, patient will follow up with neurology  to address needs related to cognitive decline  Interventions: . Collaboration call with CCM RN Tourist information centre manager . Discussed continued memory decline with the patient . Interviewed the patient to determine interest in proceeding with neurology referral . Collaboration with patients primary provider to follow up on referral status  Patient Self Care Activities:  . The patient attends provider appointments when needed . The patient calls provider office with new concerns  Please see past updates related to this goal by clicking on the "Past Updates" button in the selected goal          Follow Up Plan: Appointment scheduled for SW follow up with client by phone on: April 10th.   Daneen Schick, BSW, CDP [facility] / [facility] Care Management Social Worker [phone]  Total time spent performing care coordination and/or care management activities with the patient by phone or face to face = 14 minutes.

## 2019-01-11 NOTE — Patient Instructions (Signed)
Social Worker Visit Information  Goals we discussed today:  Goals Addressed            This Visit's Progress     Patient Stated   . "I want to name a power of attorney" (pt-stated)       Current Barriers:  . Limited education about the importance of an advance directive and what a healthcare power of attorney is for . Memory Deficits  Clinical Social Work Clinical Goal(s):  [name] Over the next 30 days, patient will work with SW to address needs related to completing an advance directive  Interventions: . Collaboration call with CCM RN Case Manager and the patient . Educated the patient the importance of advance directive completion . Scheduled call to the patient to review information on April 10th  Patient Self Care Activities:  . Self administers medications as prescribed . Attends all scheduled provider appointments . Calls pharmacy for medication refills . Performs ADL's independently . Calls provider office for new concerns or questions  Initial goal documentation     . "my memory is changing" (pt-stated)       Current Barriers:  . Social Isolation . Memory Deficits . Lacks knowledge of signs and symptoms of dementia. Patient reports beginning to become lost when driving home.  "Yesterday I got lost on my way home. I had to pull over and think for a few minutes about where I was going"  Clinical Social Work Clinical Goal(s):  [name] Over the next 30 days, patient will follow up with neurology  to address needs related to cognitive decline  Interventions: . Collaboration call with CCM RN Tourist information centre manager . Discussed continued memory decline with the patient . Interviewed the patient to determine interest in proceeding with neurology referral . Collaboration with patients primary provider to follow up on referral status  Patient Self Care Activities:  . The patient attends provider appointments when needed . The patient calls provider office with new concerns  Please see  past updates related to this goal by clicking on the "Past Updates" button in the selected goal          Materials Provided: Yes: Advance Directive Packet  Follow Up Plan: Appointment scheduled for SW follow up with client by phone on: April 10th   Aayra Hornbaker, [place], CDP [facility] / [place] Worker [phone]

## 2019-01-16 ENCOUNTER — Other Ambulatory Visit: Payer: Self-pay | Admitting: Internal Medicine

## 2019-01-16 DIAGNOSIS — E039 Hypothyroidism, unspecified: Secondary | ICD-10-CM

## 2019-01-16 DIAGNOSIS — R413 Other amnesia: Secondary | ICD-10-CM

## 2019-01-17 ENCOUNTER — Telehealth: Payer: Self-pay

## 2019-01-23 ENCOUNTER — Ambulatory Visit: Payer: Medicare HMO | Admitting: Internal Medicine

## 2019-01-24 ENCOUNTER — Ambulatory Visit: Payer: Self-pay

## 2019-01-24 ENCOUNTER — Telehealth: Payer: Self-pay

## 2019-01-24 DIAGNOSIS — R413 Other amnesia: Secondary | ICD-10-CM

## 2019-01-24 DIAGNOSIS — I10 Essential (primary) hypertension: Secondary | ICD-10-CM

## 2019-01-24 DIAGNOSIS — E039 Hypothyroidism, unspecified: Secondary | ICD-10-CM

## 2019-01-24 NOTE — Chronic Care Management (AMB) (Signed)
  Chronic Care Management   Outreach Note  01/24/2019 Name: Brianna [name] MRN: [id number] DOB: [birth year]  Referred by: [name], MD Reason for referral : Chronic Care Management (CCM TELEPHONE FOLLOW UP )  An unsuccessful telephone outreach was attempted today. The patient was referred to the case management team by [name] MD for assistance with disease management for Essential Hypertension, Memory Loss, and Primary Hypothyroidism.   Follow Up Plan: The CM team will reach out to the patient again over the next 5-7 days.    Barb Merino, RN,CCM Care Management Coordinator [place] [facility]  Direct Phone: [phone]

## 2019-01-24 NOTE — Chronic Care Management (AMB) (Signed)
  Chronic Care Management    Clinical Social Work Follow Up Note  01/24/2019 Name: Brianna [name] MRN: [id number] DOB: 1933/09/22  Brianna [name] is a 85 y.o. year old female who is a primary care patient of [name], MD. The CCM team was consulted for assistance with chronic care management and care coordination.  Review of patient status, including review of consultants reports, other relevant assessments, and collaboration with appropriate care team members and the patient's provider was performed as part of comprehensive patient evaluation and provision of chronic care management services.    Collaboration with [place] in effort to follow up with the patient to discuss care plan goals. Unfortunately today's call was unsuccessful. The patient does not have a voice mailbox established prohibiting the CCM team from leaving a HIPAA compliant voice message.    Goals Addressed            This Visit's Progress     Patient Stated   . "my memory is changing" (pt-stated)       Current Barriers:  . Social Isolation . Memory Deficits . Lacks knowledge of signs and symptoms of dementia. Patient reports beginning to become lost when driving home.  "Yesterday I got lost on my way home. I had to pull over and think for a few minutes about where I was going"  Clinical Social Work Clinical Goal(s):  [name] Over the next 30 days, patient will follow up with neurology  to address needs related to cognitive decline  Interventions: . Chart reviewed to confirm neurology referral placed . Noted upcomming appointment to see Dr. Jaynee Eagles with [facility] on June 11 . Placed unsuccessful call to the patient in efforts to reinforce appointment information with the patient  Patient Self Care Activities:  . The patient attends provider appointments when needed . The patient calls provider office with new concerns  Please see past updates related to this goal by clicking  on the "Past Updates" button in the selected goal         Follow Up Plan: Appointment scheduled for CCM team to follow up with the patient in the next week.   Daneen Schick, BSW, CDP [facility] / [facility] Care Management Social Worker [phone]  Total time spent performing care coordination and/or care management activities with the patient by phone or face to face = 8 minutes.

## 2019-01-25 ENCOUNTER — Telehealth: Payer: Self-pay

## 2019-01-30 ENCOUNTER — Ambulatory Visit (INDEPENDENT_AMBULATORY_CARE_PROVIDER_SITE_OTHER): Payer: Medicare HMO

## 2019-01-30 ENCOUNTER — Ambulatory Visit: Payer: Self-pay

## 2019-01-30 ENCOUNTER — Telehealth: Payer: Self-pay

## 2019-01-30 ENCOUNTER — Other Ambulatory Visit: Payer: Self-pay

## 2019-01-30 DIAGNOSIS — I10 Essential (primary) hypertension: Secondary | ICD-10-CM

## 2019-01-30 DIAGNOSIS — E039 Hypothyroidism, unspecified: Secondary | ICD-10-CM | POA: Diagnosis not present

## 2019-01-30 DIAGNOSIS — R413 Other amnesia: Secondary | ICD-10-CM

## 2019-01-30 NOTE — Patient Instructions (Signed)
Social Worker Visit Information  Goals we discussed today:  Goals Addressed            This Visit's Progress                              . "my memory is changing" (pt-stated)   On track    Current Barriers:  . Social Isolation . Memory Deficits . Lacks knowledge of signs and symptoms of dementia. Patient reports beginning to become lost when driving home.  "Yesterday I got lost on my way home. I had to pull over and think for a few minutes about where I was going"  Clinical Social Work Clinical Goal(s):  [name] Over the next 30 days, patient will follow up with neurology  to address needs related to cognitive decline  Interventions: . Telephonic follow up by CCM SW and CCM RNCM  . Reminded the patient of [place] neurology appointment on June 11 to see Dr. Jaynee Eagles . Provided the patient with appointment time and address . Advised the patient to write information down on her calendar . Encouraged the patient to include her son in this [place] appointment  Patient Self Care Activities:  . The patient attends provider appointments when needed . The patient calls provider office with new concerns  Please see past updates related to this goal by clicking on the "Past Updates" button in the selected goal          Materials Provided: No  Follow Up Plan: SW will follow up with the patient over the next 3-4 weeks   Daneen Schick, BSW, CDP [facility] / [place] Management Social Worker [phone]

## 2019-01-30 NOTE — Patient Instructions (Signed)
Visit Information  Goals Addressed      Patient Stated   . "I get confused on how to take my thyroid medication" (pt-stated)   On track    Current Barriers:  [name] Knowledge Deficit related to Hypothyroidism  . Impaired memory  Nurse Case Manager Clinical Goal(s):  [name] Over the next 90 days, patients thyroid levels will be within normal range.  Interventions:   Telephone outreach to patient for CM follow up . Assessed for adherence to following medication regimen for Synthroid as directed by [name] Cancer . Reinforced importance of taking thyroid medication exactly as prescribed without missed doses for best results and to help avoid potential complications with thyroid dysfunction (pt states she is adhering and is using her pill box to help her remember to take meds, she is taking thyroid medication in the am 1-2 hours before breakfast) . Confirmed patient has RN CM phone number and encouraged a call back if needed before next scheduled call . Scheduled collaborative BSW/CCM follow up call with patient for 02/21/19  Patient Self Care Activities:   Verbalizes understanding of the education/information provided today  Self administers medications as prescribed . Drives self and attends all scheduled provider appointments . Calls pharmacy for medication refills . Performs ADL's independently . Calls provider office for new concerns or questions  Please see past updates related to this goal by clicking on the "Past Updates" button in the selected goal     . "I pretty much eat whatever I want" (pt-stated)   On track    Current Barriers:  [name] Knowledge Deficits related to disease process and treament for Hyperlipidemia   Nurse Case Manager Clinical Goal(s):   Over the next 30 days, patient will report adhering to a heart healthy diet (low cholesterol, low fat, low sodium)  Interventions:   CCM telephone follow up completed with patient  Discussed plan to have lipid panel checked once TSH  is WNL per PCP recommendations   Assessed for patient adherence to following a low sodium, low fat diet (pt reports adherence)  Scheduled telephone CCM follow up call with patient   Patient Self Care Activities:   Verbalizes understanding of the education/information provided today  Self administers medications as prescribed . Drives self and attends all scheduled provider appointments . Calls pharmacy for medication refills . Performs ADL's independently . Calls provider office for new concerns or questions  Please see past updates related to this goal by clicking on the "Past Updates" button in the selected goal        The patient verbalized understanding of instructions provided today and declined a print copy of patient instruction materials.   Telephone follow up appointment with CCM team member scheduled for: 02/21/19  Barb Merino, [facility] Care Management Coordinator [place] [facility]  Direct Phone: [phone]

## 2019-01-30 NOTE — Chronic Care Management (AMB) (Signed)
Chronic Care Management   Follow Up Note   01/30/2019 Name: Brianna [name] MRN: [id number] DOB: 07/23/1933  Referred by: [name], MD Reason for referral : Chronic Care Management (CCM Telephone Follow Up )   Brianna [name] is a 85 y.o. year old female who is a primary care patient of [name], MD. The CCM team was consulted for assistance with chronic disease management and care coordination needs.    Review of patient status, including review of consultants reports, relevant laboratory and other test results, and collaboration with appropriate care team members and the patient's provider was performed as part of comprehensive patient evaluation and provision of chronic care management services.    The CCM team spoke with Brianna [name] by telephone today.   Goals Addressed      Patient Stated   . "I get confused on how to take my thyroid medication" (pt-stated)   On track    Current Barriers:  Brianna [name] Knowledge Deficit related to Hypothyroidism  . Impaired memory  Nurse Case Manager Clinical Goal(s):  Brianna [name] Over the next 90 days, patients thyroid levels will be within normal range.  Interventions:   Telephone outreach to patient for CM follow up . Assessed for adherence to following medication regimen for Synthroid as directed by [name] Cancer . Reinforced importance of taking thyroid medication exactly as prescribed without missed doses for best results and to help avoid potential complications with thyroid dysfunction (pt states she is adhering and is using her pill box to help her remember to take meds, she is taking thyroid medication in the am 1-2 hours before breakfast) . Confirmed patient has RN CM phone number and encouraged a call back if needed before next scheduled call . Scheduled collaborative BSW/CCM follow up call with patient for 02/21/19  Patient Self Care Activities:   Verbalizes understanding of the education/information provided today  Self administers  medications as prescribed . Drives self and attends all scheduled provider appointments . Calls pharmacy for medication refills . Performs ADL's independently . Calls provider office for new concerns or questions  Please see past updates related to this goal by clicking on the "Past Updates" button in the selected goal     . "I pretty much eat whatever I want" (pt-stated)   On track    Current Barriers:  Brianna [name] Knowledge Deficits related to disease process and treament for Hyperlipidemia   Nurse Case Manager Clinical Goal(s):   Over the next 30 days, patient will report adhering to a heart healthy diet (low cholesterol, low fat, low sodium)  Interventions:   CCM telephone follow up completed with patient  Discussed plan to have lipid panel checked once TSH is WNL per PCP recommendations   Assessed for patient adherence to following a low sodium, low fat diet (pt reports adherence)  Scheduled telephone CCM follow up call with patient   Patient Self Care Activities:   Verbalizes understanding of the education/information provided today  Self administers medications as prescribed . Drives self and attends all scheduled provider appointments . Calls pharmacy for medication refills . Performs ADL's independently . Calls provider office for new concerns or questions   Please see past updates related to this goal by clicking on the "Past Updates" button in the selected goal         Telephone follow up appointment with CCM team member scheduled for: 02/21/19   Barb Merino, [facility] Care Management Coordinator [place] [facility]  Direct Phone: [phone]

## 2019-01-30 NOTE — Chronic Care Management (AMB) (Deleted)
  Chronic Care Management   Follow Up Note   01/30/2019 Name: Brianna [name] MRN: [id number] DOB: [birth year]  Referred by: [name], MD Reason for referral : [place] is a 85 y.o. year old female who is a primary care patient of [name], MD. The CCM team was consulted for assistance with chronic disease management and care coordination needs.    Review of patient status, including review of consultants reports, relevant laboratory and other test results, and collaboration with appropriate care team members and the patient's provider was performed as part of comprehensive patient evaluation and provision of chronic care management services.    The CCM team spoke with Brianna [name] by telephone today.   Goals Addressed      Patient Stated   . "I get confused on how to take my thyroid medication" (pt-stated)   On track    Current Barriers:  [name] Knowledge Deficit related to Hypothyroidism  . Impaired memory  Nurse Case Manager Clinical Goal(s):  [name] Over the next 90 days, patients thyroid levels will be within normal range.  Interventions:   Telephone outreach to patient for CM follow up . Assessed for adherence to following medication regimen for Synthroid as directed by [name] Cancer . Reinforced importance of taking thyroid medication exactly as prescribed without missed doses for best results and to help avoid potential complications with thyroid dysfunction (pt states she is adhering and is using her pill box to help her remember to take meds, she is taking thyroid medication in the am 1-2 hours before breakfast) . Confirmed patient has RN CM phone number and encouraged a call back if needed before next scheduled call . Scheduled collaborative BSW/CCM follow up call with patient for 02/21/19  Patient Self Care Activities:   Verbalizes understanding of the education/information provided today  Self administers medications as prescribed .  Drives self and attends all scheduled provider appointments . Calls pharmacy for medication refills . Performs ADL's independently . Calls provider office for new concerns or questions  Please see past updates related to this goal by clicking on the "Past Updates" button in the selected goal     . "I pretty much eat whatever I want" (pt-stated)   On track    Current Barriers:  [name] Knowledge Deficits related to disease process and treament for Hyperlipidemia   Nurse Case Manager Clinical Goal(s):   Over the next 30 days, patient will report adhering to a heart healthy diet (low cholesterol, low fat, low sodium)  Interventions:   CCM telephone follow up completed with patient  Discussed plan to have lipid panel checked once TSH is WNL per PCP recommendations   Assessed for patient adherence to following a low sodium, low fat diet (pt reports adherence)  Scheduled telephone CCM follow up call with patient   Patient Self Care Activities:   Verbalizes understanding of the education/information provided today  Self administers medications as prescribed . Drives self and attends all scheduled provider appointments . Calls pharmacy for medication refills . Performs ADL's independently . Calls provider office for new concerns or questions  Please see past updates related to this goal by clicking on the "Past Updates" button in the selected goal         Telephone follow up appointment with CCM team member scheduled for: 02/21/19  Barb Merino, [facility] Care Management Coordinator [place] [facility]  Direct Phone: [phone]

## 2019-01-30 NOTE — Chronic Care Management (AMB) (Signed)
  Chronic Care Management    Clinical Social Work Follow Up Note  01/30/2019 Name: Brianna [name] MRN: [id number] DOB: 16-Oct-1933  Brianna [name] is a 85 y.o. year old female who is a primary care patient of [name], MD. The CCM team was consulted for assistance with chronic care management and care coordination of resource needs.   Review of patient status, including review of consultants reports, other relevant assessments, and collaboration with appropriate care team members and the patient's provider was performed as part of comprehensive patient evaluation and provision of chronic care management services.     Goals Addressed            This Visit's Progress                              . "my memory is changing" (pt-stated)   On track    Current Barriers:  . Social Isolation . Memory Deficits . Lacks knowledge of signs and symptoms of dementia. Patient reports beginning to become lost when driving home.  "Yesterday I got lost on my way home. I had to pull over and think for a few minutes about where I was going"  Clinical Social Work Clinical Goal(s):  [name] Over the next 30 days, patient will follow up with neurology  to address needs related to cognitive decline  Interventions: . Telephonic follow up by CCM SW and CCM RNCM  . Reminded the patient of [place] neurology appointment on June 11 to see Dr. Jaynee Eagles . Provided the patient with appointment time and address . Advised the patient to write information down on her calendar . Encouraged the patient to include her son in this [place] appointment  Patient Self Care Activities:  . The patient attends provider appointments when needed . The patient calls provider office with new concerns  Please see past updates related to this goal by clicking on the "Past Updates" button in the selected goal          Follow Up Plan: SW will follow up with patient by phone over the next 3-4 weeks.   Daneen Schick,  BSW, CDP [facility] Care Management Social Worker [phone]  Total time spent performing care coordination and/or care management activities with the patient by phone or face to face = 18 minutes.

## 2019-02-13 ENCOUNTER — Telehealth: Payer: Self-pay

## 2019-02-21 ENCOUNTER — Telehealth: Payer: Self-pay

## 2019-02-21 ENCOUNTER — Ambulatory Visit: Payer: Self-pay

## 2019-02-21 DIAGNOSIS — E039 Hypothyroidism, unspecified: Secondary | ICD-10-CM

## 2019-02-21 DIAGNOSIS — I10 Essential (primary) hypertension: Secondary | ICD-10-CM

## 2019-02-21 DIAGNOSIS — R413 Other amnesia: Secondary | ICD-10-CM

## 2019-02-21 NOTE — Chronic Care Management (AMB) (Signed)
  Chronic Care Management   Outreach Note  02/21/2019 Name: Brianna [name] MRN: [id number] DOB: September 22, 1933  Referred by: [name], MD Reason for referral : Care Coordination   An unsuccessful telephone outreach was attempted today. The patient was referred to the case management team by for assistance with chronic care management and care coordination.   Follow Up Plan: The CM team will reach out to the patient again over the next 7-10  days.   Daneen Schick, BSW, CDP [facility] / [facility] Care Management Social Worker [phone]  Total time spent performing care coordination and/or care management activities with the patient by phone or face to face = 3 minutes.

## 2019-02-25 ENCOUNTER — Other Ambulatory Visit: Payer: Self-pay

## 2019-02-25 ENCOUNTER — Ambulatory Visit (INDEPENDENT_AMBULATORY_CARE_PROVIDER_SITE_OTHER): Payer: Medicare HMO | Admitting: Internal Medicine

## 2019-02-25 ENCOUNTER — Encounter: Payer: Self-pay | Admitting: Internal Medicine

## 2019-02-25 VITALS — Temp 97.8°F | Ht 67.2 in | Wt 160.2 lb

## 2019-02-25 DIAGNOSIS — I951 Orthostatic hypotension: Secondary | ICD-10-CM | POA: Diagnosis not present

## 2019-02-25 DIAGNOSIS — E039 Hypothyroidism, unspecified: Secondary | ICD-10-CM | POA: Diagnosis not present

## 2019-02-25 DIAGNOSIS — E860 Dehydration: Secondary | ICD-10-CM | POA: Diagnosis not present

## 2019-02-25 DIAGNOSIS — R413 Other amnesia: Secondary | ICD-10-CM

## 2019-02-25 NOTE — Patient Instructions (Addendum)
TAKE AMLODIPINE AT NIGHT --- WITH YOUR EVENING MEAL  DRINK PLENTY OF WATER  Start Synthroid 148mcg Monday through Friday ONLY  Skip Saturday and Sunday dosing.   Dizziness Dizziness is a common problem. It makes you feel unsteady or light-headed. You may feel like you are about to pass out (faint). Dizziness can lead to getting hurt if you stumble or fall. Dizziness can be caused by many things, including:  Medicines.  Not having enough water in your body (dehydration).  Illness. Follow these instructions at home: Eating and drinking   Drink enough fluid to keep your pee (urine) clear or pale yellow. This helps to keep you from getting dehydrated. Try to drink more clear fluids, such as water.  Do not drink alcohol.  Limit how much caffeine you drink or eat, if your doctor tells you to do that.  Limit how much salt (sodium) you drink or eat, if your doctor tells you to do that. Activity   Avoid making quick movements. ? When you stand up from sitting in a chair, steady yourself until you feel okay. ? In the morning, first sit up on the side of the bed. When you feel okay, stand slowly while you hold onto something. Do this until you know that your balance is fine.  If you need to stand in one place for a long time, move your legs often. Tighten and relax the muscles in your legs while you are standing.  Do not drive or use heavy machinery if you feel dizzy.  Avoid bending down if you feel dizzy. Place items in your home so you can reach them easily without leaning over. Lifestyle  Do not use any products that contain nicotine or tobacco, such as cigarettes and e-cigarettes. If you need help quitting, ask your doctor.  Try to lower your stress level. You can do this by using methods such as yoga or meditation. Talk with your doctor if you need help. General instructions  Watch your dizziness for any changes.  Take over-the-counter and prescription medicines only as  told by your doctor. Talk with your doctor if you think that you are dizzy because of a medicine that you are taking.  Tell a friend or a family member that you are feeling dizzy. If he or she notices any changes in your behavior, have this person call your doctor.  Keep all follow-up visits as told by your doctor. This is important. Contact a doctor if:  Your dizziness does not go away.  Your dizziness or light-headedness gets worse.  You feel sick to your stomach (nauseous).  You have trouble hearing.  You have new symptoms.  You are unsteady on your feet.  You feel like the room is spinning. Get help right away if:  You throw up (vomit) or have watery poop (diarrhea), and you cannot eat or drink anything.  You have trouble: ? Talking. ? Walking. ? Swallowing. ? Using your arms, hands, or legs.  You feel generally weak.  You are not thinking clearly, or you have trouble forming sentences. A friend or family member may notice this.  You have: ? Chest pain. ? Pain in your belly (abdomen). ? Shortness of breath. ? Sweating.  Your vision changes.  You are bleeding.  You have a very bad headache.  You have neck pain or a stiff neck.  You have a fever. These symptoms may be an emergency. Do not wait to see if the symptoms will go away.  Get medical help right away. Call your local emergency services (911 in the [place] Do not drive yourself to the hospital. Summary  Dizziness makes you feel unsteady or light-headed. You may feel like you are about to pass out (faint).  Drink enough fluid to keep your pee (urine) clear or pale yellow. Do not drink alcohol.  Avoid making quick movements if you feel dizzy.  Watch your dizziness for any changes. This information is not intended to replace advice given to you by your health care provider. Make sure you discuss any questions you have with your health care provider. Document Released: 09/22/2011 Document Revised:  10/20/2016 Document Reviewed: 10/20/2016 Elsevier Interactive Patient Education  2019 [employer].

## 2019-02-28 ENCOUNTER — Telehealth: Payer: Self-pay

## 2019-02-28 ENCOUNTER — Other Ambulatory Visit: Payer: Self-pay

## 2019-02-28 ENCOUNTER — Ambulatory Visit (INDEPENDENT_AMBULATORY_CARE_PROVIDER_SITE_OTHER): Payer: Medicare HMO

## 2019-02-28 ENCOUNTER — Ambulatory Visit: Payer: Self-pay

## 2019-02-28 DIAGNOSIS — E039 Hypothyroidism, unspecified: Secondary | ICD-10-CM | POA: Diagnosis not present

## 2019-02-28 DIAGNOSIS — R4182 Altered mental status, unspecified: Secondary | ICD-10-CM

## 2019-02-28 DIAGNOSIS — R413 Other amnesia: Secondary | ICD-10-CM

## 2019-02-28 DIAGNOSIS — I951 Orthostatic hypotension: Secondary | ICD-10-CM

## 2019-02-28 DIAGNOSIS — I10 Essential (primary) hypertension: Secondary | ICD-10-CM

## 2019-02-28 DIAGNOSIS — E860 Dehydration: Secondary | ICD-10-CM

## 2019-02-28 NOTE — Chronic Care Management (AMB) (Signed)
  Chronic Care Management    Clinical Social Work Follow Up Note  02/28/2019 Name: Brianna [name] MRN: [id number] DOB: September 02, 1933  Brianna [name] is a 85 y.o. year old female who is a primary care patient of [name], MD. The CCM team was consulted for assistance with Community Resources and Level of Care Concerns.   Review of patient status, including review of consultants reports, other relevant assessments, and collaboration with appropriate care team members and the patient's provider was performed as part of comprehensive patient evaluation and provision of chronic care management services.    Collaborative call with CCM RN Case Manager to the patients home to assess progress of patient stated goals.   Goals Addressed            This Visit's Progress     Patient Stated   . "my memory is changing" (pt-stated)       Current Barriers:  . Social Isolation . Memory Deficits . Lacks knowledge of signs and symptoms of dementia. Patient reports beginning to become lost when driving home.  "Yesterday I got lost on my way home. I had to pull over and think for a few minutes about where I was going"  Clinical Social Work Clinical Goal(s):  [name] Over the next 30 days, patient will follow up with neurology  to address needs related to cognitive decline Not met re-established as a goal on 5/14  Interventions: . Telephonic follow up by CCM SW and CCM RNCM  . Reminded the patient of [place] neurology appointment on June 11 to see Dr. Jaynee Eagles . Provided the patient with appointment time and address . Utilized teach back method to confirm patient had correct information written down. . Encouraged the patient to have her son attend this appointment with her . Determined the patient would like CCM SW to contact her prior to neurology appointment to remind her of above information  Patient Self Care Activities:  . The patient attends provider appointments when needed . The patient  calls provider office with new concerns . The patient utilizes a calendar to keep track of important appointments  Please see past updates related to this goal by clicking on the "Past Updates" button in the selected goal        Follow Up Plan: Appointment scheduled for SW follow up with client by phone on: June 10th   Daneen Schick, [place], CDP [facility] / [place] Worker [phone]  Total time spent performing care coordination and/or care management activities with the patient by phone or face to face = 11 minutes.

## 2019-02-28 NOTE — Patient Instructions (Signed)
Social Worker Visit Information  Goals we discussed today:  Goals Addressed            This Visit's Progress     Patient Stated   . "my memory is changing" (pt-stated)       Current Barriers:  . Social Isolation . Memory Deficits . Lacks knowledge of signs and symptoms of dementia. Patient reports beginning to become lost when driving home.  "Yesterday I got lost on my way home. I had to pull over and think for a few minutes about where I was going"  Clinical Social Work Clinical Goal(s):  [name] Over the next 30 days, patient will follow up with neurology  to address needs related to cognitive decline Not met re-established as a goal on 5/14  Interventions: . Telephonic follow up by CCM SW and CCM RNCM  . Reminded the patient of [place] neurology appointment on June 11 to see Dr. Jaynee Eagles . Provided the patient with appointment time and address . Utilized teach back method to confirm patient had correct information written down. . Encouraged the patient to have her son attend this appointment with her . Determined the patient would like CCM SW to contact her prior to neurology appointment to remind her of above information  Patient Self Care Activities:  . The patient attends provider appointments when needed . The patient calls provider office with new concerns . The patient utilizes a calendar to keep track of important appointments  Please see past updates related to this goal by clicking on the "Past Updates" button in the selected goal          Materials Provided: Verbal education about neurology appointment provided by phone  Follow Up Plan: Appointment scheduled for SW follow up with client by phone on: June 10   Rodriquez Thorner, BSW, CDP [facility] / [place] Worker [phone]

## 2019-03-01 NOTE — Patient Instructions (Signed)
Visit Information  Goals Addressed      Patient Stated   . "I get confused on how to take my thyroid medication" (pt-stated)   Not on track    Current Barriers:  [name] Knowledge Deficit related to Hypothyroidism  . Impaired memory  Nurse Case Manager Clinical Goal(s):  [name] Over the next 90 days, patients thyroid levels will be within normal range.  Interventions:   Telephone outreach to patient for CM follow up . Assessed for adherence to following medication regimen for Synthroid as directed by [name] Cancer  (pt states she is adhering and is using her pill box to help her remember to take meds, she is taking thyroid medication in the am 1-2 hours before breakfast)  Reviewed recent med changes; Start Synthroid 146mg Monday through Friday ONLY: Skip Saturday and Sunday dosing . Reinforced importance of taking thyroid medication exactly as prescribed without missed doses for best results and to help avoid potential complications with thyroid dysfunction . Reviewed Dr. SBaird Cancerrecommendation to take her Amlodipine with her evening meal . Discussed potential referral for having a HH SNV to assist with medication management; further discussed having in home ST to assist with brain mnemonics to help improve memory  Scheduled CCM RN [facility]  Patient Self Care Activities:   Verbalizes understanding of the education/information provided today  Self administers medications as prescribed . Drives self and attends all scheduled provider appointments . Calls pharmacy for medication refills . Performs ADL's independently . Calls provider office for new concerns or questions  Please see past updates related to this goal by clicking on the "Past Updates" button in the selected goal     . "I pretty much eat whatever I want" (pt-stated)   On track    Current Barriers:  .Marland KitchenKnowledge Deficits related to disease process and treament for Hyperlipidemia    Nurse Case Manager Clinical Goal(s):   Over the next 30 days, patient will report adhering to a heart healthy diet (low cholesterol, low fat, low sodium)  Interventions:   CCM telephone follow up completed with patient  Discussed plan to have lipid panel checked once TSH is WNL per PCP recommendations   Reinforced importance of following a low sodium, low fat diet (pt reports adherence)  Discussed meal planning using the plate method with portion control   Mailed printed patient ed mats related to diabetes meal planning   Scheduled telephone CCM follow up call with patient   Patient Self Care Activities:   Verbalizes understanding of the education/information provided today  Self administers medications as prescribed . Drives self and attends all scheduled provider appointments . Calls pharmacy for medication refills . Performs ADL's independently . Calls provider office for new concerns or questions   Please see past updates related to this goal by clicking on the "Past Updates" button in the selected goal      . "my memory is changing" (pt-stated)   On track    Current Barriers:  . Social Isolation . Memory Deficits . Lacks knowledge of signs and symptoms of dementia. Patient reports beginning to become lost when driving home.  "Yesterday I got lost on my way home. I had to pull over and think for a few minutes about where I was going"  Clinical Social Work Clinical Goal(s):  .Marland KitchenOver the next 30 days, patient will follow up with neurology  to address needs related to cognitive decline Not met  re-established as a goal on 5/14  Interventions: . Telephonic follow up by CCM SW and CCM RNCM  . Reminded the patient of [place] neurology appointment on June 11 to see Dr. Jaynee Eagles . Provided the patient with appointment time and address . Utilized teach back method to confirm patient had correct information written down. . Encouraged the patient to have her son attend this  appointment with her . Determined the patient would like CCM SW to contact her prior to neurology appointment to remind her of above information  Patient Self Care Activities:  . The patient attends provider appointments when needed . The patient calls provider office with new concerns . The patient utilizes a calendar to keep track of important appointments  Please see past updates related to this goal by clicking on the "Past Updates" button in the selected goal        The patient verbalized understanding of instructions provided today and declined a print copy of patient instruction materials.   Follow up with provider re: Referral for [place], [facility] Care Management Coordinator [place] [facility]  Direct Phone: [phone]

## 2019-03-01 NOTE — Chronic Care Management (AMB) (Signed)
Chronic Care Management   Follow Up Note   02/28/2019 Name: Brianna [name] MRN: [id number] DOB: Dec 17, 1932  Referred by: [name], MD Reason for referral : Chronic Care Management (CCM RN Telephone Follow Up )   ADEN YOUNGMAN is a 85 y.o. year old female who is a primary care patient of [name], MD. The CCM team was consulted for assistance with chronic disease management and care coordination needs.    Review of patient status, including review of consultants reports, relevant laboratory and other test results, and collaboration with appropriate care team members and the patient's provider was performed as part of comprehensive patient evaluation and provision of chronic care management services.    The CCM team spoke with Brianna [name] by telephone today.   Goals Addressed      Patient Stated   . "I get confused on how to take my thyroid medication" (pt-stated)   Not on track    Current Barriers:  [name] Knowledge Deficit related to Hypothyroidism  . Impaired memory  Nurse Case Manager Clinical Goal(s):  [name] Over the next 90 days, patients thyroid levels will be within normal range.  Interventions:   Telephone outreach to patient for CM follow up . Assessed for adherence to following medication regimen for Synthroid as directed by [name] Cancer  (pt states she is adhering and is using her pill box to help her remember to take meds, she is taking thyroid medication in the am 1-2 hours before breakfast)  Reviewed recent med changes; Start Synthroid 15mg Monday through Friday ONLY: Skip Saturday and Sunday dosing . Reinforced importance of taking thyroid medication exactly as prescribed without missed doses for best results and to help avoid potential complications with thyroid dysfunction . Reviewed Dr. SBaird Cancerrecommendation to take her Amlodipine with her evening meal . Discussed potential referral for having a HH SNV to assist with medication management; further  discussed having in home ST to assist with brain mnemonics to help improve memory  Scheduled CCM RN follow up call [facility]  Patient Self Care Activities:   Verbalizes understanding of the education/information provided today  Self administers medications as prescribed . Drives self and attends all scheduled provider appointments . Calls pharmacy for medication refills . Performs ADL's independently . Calls provider office for new concerns or questions  Please see past updates related to this goal by clicking on the "Past Updates" button in the selected goal     . "I pretty much eat whatever I want" (pt-stated)   On track    Current Barriers:  .Marland KitchenKnowledge Deficits related to disease process and treament for Hyperlipidemia   Nurse Case Manager Clinical Goal(s):   Over the next 30 days, patient will report adhering to a heart healthy diet (low cholesterol, low fat, low sodium)  Interventions:   CCM telephone follow up completed with patient  Discussed plan to have lipid panel checked once TSH is WNL per PCP recommendations   Reinforced importance of following a low sodium, low fat diet (pt reports adherence)  Discussed meal planning using the plate method with portion control   Mailed printed patient ed mats related to diabetes meal planning   Scheduled telephone CCM follow up call with patient   Patient Self Care Activities:   Verbalizes understanding of the education/information provided today  Self administers medications as prescribed . Drives self and attends all scheduled provider appointments . Calls pharmacy for medication refills . Performs  ADL's independently . Calls provider office for new concerns or questions  Please see past updates related to this goal by clicking on the "Past Updates" button in the selected goal     . "my memory is changing" (pt-stated)   On track    Current Barriers:  . Social Isolation .  Memory Deficits . Lacks knowledge of signs and symptoms of dementia. Patient reports beginning to become lost when driving home.  "Yesterday I got lost on my way home. I had to pull over and think for a few minutes about where I was going"  Clinical Social Work Clinical Goal(s):  [name] Over the next 30 days, patient will follow up with neurology  to address needs related to cognitive decline Not met re-established as a goal on 5/14  Interventions: . Telephonic follow up by CCM SW and CCM RNCM  . Reminded the patient of [place] neurology appointment on June 11 to see Dr. Jaynee Eagles . Provided the patient with appointment time and address . Utilized teach back method to confirm patient had correct information written down. . Encouraged the patient to have her son attend this appointment with her . Determined the patient would like CCM SW to contact her prior to neurology appointment to remind her of above information  Patient Self Care Activities:  . The patient attends provider appointments when needed . The patient calls provider office with new concerns . The patient utilizes a calendar to keep track of important appointments  Please see past updates related to this goal by clicking on the "Past Updates" button in the selected goal          Follow up with provider re: referral for [place], [facility] Care Management Coordinator [place] [facility]  Direct Phone: [phone]

## 2019-03-03 NOTE — Progress Notes (Signed)
Subjective:     Patient ID: Brianna [name] , female    DOB: October 23, 1932 , 85 y.o.   MRN: [id number]   Chief Complaint  Patient presents with  . Memory loss  . Dizziness    HPI  She is here today for f/u memory loss. She reports that she is feeling well. She has no concerns. Admitted to nurse she was dizzy. She is unable to determine what triggers her symptoms.   Dizziness  This is a recurrent problem. The current episode started 1 to 4 weeks ago. The problem occurs intermittently. Pertinent negatives include no arthralgias, chills, coughing, fever or headaches. The symptoms are aggravated by standing. She has tried nothing for the symptoms. The treatment provided no relief.     Past Medical History:  Diagnosis Date  . Headache 12/24/2013  . Headache(784.0)   . Hypertension    pcp  [facility]  . Hypothyroid   . MGUS (monoclonal gammopathy of unknown significance) 12/10/2013     Family History  Problem Relation Age of Onset  . Cancer Maternal Grandfather        stomach cancer  . Cancer Paternal Grandfather        stomach cancer  . Healthy Mother   . Healthy Father      Current Outpatient Medications:  .  amLODipine (NORVASC) 5 MG tablet, TAKE 1 TABLET BY MOUTH EVERY DAY, Disp: 90 tablet, Rfl: 1 .  aspirin 81 MG tablet, Take 81 mg by mouth daily., Disp: , Rfl:  .  Cholecalciferol (VITAMIN D3) 125 MCG (5000 UT) CAPS, Take 5,000 capsules by mouth daily. , Disp: , Rfl:  .  fish oil-omega-3 fatty acids 1000 MG capsule, Take 1 g by mouth daily. , Disp: , Rfl:  .  furosemide (LASIX) 40 MG tablet, Take 40 mg by mouth daily as needed for fluid or edema., Disp: , Rfl:  .  levothyroxine (SYNTHROID, LEVOTHROID) 112 MCG tablet, Take 112 mcg by mouth daily before breakfast. 1 daily, Disp: , Rfl:  .  Multiple Vitamin (MULTIVITAMIN WITH MINERALS) TABS, Take 1 tablet by mouth daily. , Disp: , Rfl:  .  SYNTHROID 125 MCG tablet, TAKE 1 TABLET DAILY ON ODD DAYS ONLY, Disp: , Rfl:  .   meclizine (ANTIVERT) 12.5 MG tablet, Take 1 tablet (12.5 mg total) by mouth 3 (three) times daily as needed for dizziness. (Patient not taking: Reported on 02/25/2019), Disp: 21 tablet, Rfl: 0 .  senna (SENOKOT) 8.6 MG TABS tablet, Take 1 tablet by mouth daily as needed for mild constipation., Disp: , Rfl:    Allergies  Allergen Reactions  . Iodine Other (See Comments)    Reaction unknown  . Sulfa Antibiotics Other (See Comments)    Childhood reaction     Review of Systems  Constitutional: Negative.  Negative for chills and fever.  Respiratory: Negative.  Negative for cough.   Cardiovascular: Negative.   Gastrointestinal: Negative.   Musculoskeletal: Negative for arthralgias.  Neurological: Positive for dizziness. Negative for headaches.  Psychiatric/Behavioral: Negative.      Today's Vitals   02/25/19 1115  Temp: 97.8 F (36.6 C)  TempSrc: Oral  Weight: 160 lb 3.2 oz (72.7 kg)  Height: 5' 7.2" (1.707 m)  PainSc: 8   PainLoc: Head   Body mass index is 24.94 kg/m.   Objective:  Physical Exam Vitals signs and nursing note reviewed.  Constitutional:      Appearance: Normal appearance.  HENT:     Head: Normocephalic and  atraumatic.  Cardiovascular:     Rate and Rhythm: Normal rate and regular rhythm.     Heart sounds: Normal heart sounds.  Pulmonary:     Effort: Pulmonary effort is normal.     Breath sounds: Normal breath sounds.  Skin:    General: Skin is warm.  Neurological:     General: No focal deficit present.     Mental Status: She is alert.  Psychiatric:        Mood and Affect: Mood normal.        Behavior: Behavior normal.         Assessment And Plan:     1. Memory loss  She still does not wish for me to start her on medication. She prefers to "handle this with Neurology". However, since I do not think she is taking her meds as prescribed, I will refer pt to home health for medication management. She is agreeable to her treatment plan.   -  Ambulatory referral to Home Health  2. Orthostatic hypotension  She is encouraged to increase her water intake. She has furosemide prescribed as needed, initially started by previous physician. Unfortunately, I am not able to determine how many days per week she is actually taking the medication.  She is advised to not take the medication more than 2-3 times per week. She is accepting of this information. I will also refer her to home health for medication management. She is agreeable to this treatment plan.   3. Dehydration  Again, she is encouraged to stay well hydrated.   4. Primary hypothyroidism  I will change her dosing to 180mcg M-F. I do not think she is following the prescribed dosing schedule.   Maximino Greenland, MD    THE PATIENT IS ENCOURAGED TO PRACTICE SOCIAL DISTANCING DUE TO THE COVID-19 PANDEMIC.

## 2019-03-06 ENCOUNTER — Telehealth: Payer: Self-pay

## 2019-03-14 ENCOUNTER — Telehealth: Payer: Self-pay

## 2019-03-25 ENCOUNTER — Encounter: Payer: Self-pay | Admitting: Internal Medicine

## 2019-03-25 ENCOUNTER — Ambulatory Visit: Payer: Medicare HMO | Admitting: Internal Medicine

## 2019-03-25 ENCOUNTER — Ambulatory Visit (INDEPENDENT_AMBULATORY_CARE_PROVIDER_SITE_OTHER): Payer: Medicare HMO | Admitting: Internal Medicine

## 2019-03-25 ENCOUNTER — Other Ambulatory Visit: Payer: Self-pay

## 2019-03-25 VITALS — BP 140/82 | HR 68 | Temp 97.8°F | Ht 65.0 in | Wt 155.0 lb

## 2019-03-25 DIAGNOSIS — R42 Dizziness and giddiness: Secondary | ICD-10-CM | POA: Diagnosis not present

## 2019-03-25 DIAGNOSIS — I10 Essential (primary) hypertension: Secondary | ICD-10-CM | POA: Diagnosis not present

## 2019-03-25 DIAGNOSIS — E039 Hypothyroidism, unspecified: Secondary | ICD-10-CM

## 2019-03-25 NOTE — Patient Instructions (Signed)
Hypothyroidism  Hypothyroidism is when the thyroid gland does not make enough of certain hormones (it is underactive). The thyroid gland is a small gland located in the lower front part of the neck, just in front of the windpipe (trachea). This gland makes hormones that help control how the body uses food for energy (metabolism) as well as how the heart and brain function. These hormones also play a role in keeping your bones strong. When the thyroid is underactive, it produces too little of the hormones thyroxine (T4) and triiodothyronine (T3). What are the causes? This condition may be caused by:  Hashimoto's disease. This is a disease in which the body's disease-fighting system (immune system) attacks the thyroid gland. This is the most common cause.  Viral infections.  Pregnancy.  Certain medicines.  Birth defects.  Past radiation treatments to the head or neck for cancer.  Past treatment with radioactive iodine.  Past exposure to radiation in the environment.  Past surgical removal of part or all of the thyroid.  Problems with a gland in the center of the brain (pituitary gland).  Lack of enough iodine in the diet. What increases the risk? You are more likely to develop this condition if:  You are female.  You have a family history of thyroid conditions.  You use a medicine called lithium.  You take medicines that affect the immune system (immunosuppressants). What are the signs or symptoms? Symptoms of this condition include:  Feeling as though you have no energy (lethargy).  Not being able to tolerate cold.  Weight gain that is not explained by a change in diet or exercise habits.  Lack of appetite.  Dry skin.  Coarse hair.  Menstrual irregularity.  Slowing of thought processes.  Constipation.  Sadness or depression. How is this diagnosed? This condition may be diagnosed based on:  Your symptoms, your medical history, and a physical exam.  Blood  tests. You may also have imaging tests, such as an ultrasound or MRI. How is this treated? This condition is treated with medicine that replaces the thyroid hormones that your body does not make. After you begin treatment, it may take several weeks for symptoms to go away. Follow these instructions at home:  Take over-the-counter and prescription medicines only as told by your health care provider.  If you start taking any new medicines, tell your health care provider.  Keep all follow-up visits as told by your health care provider. This is important. ? As your condition improves, your dosage of thyroid hormone medicine may change. ? You will need to have blood tests regularly so that your health care provider can monitor your condition. Contact a health care provider if:  Your symptoms do not get better with treatment.  You are taking thyroid replacement medicine and you: ? Sweat a lot. ? Have tremors. ? Feel anxious. ? Lose weight rapidly. ? Cannot tolerate heat. ? Have emotional swings. ? Have diarrhea. ? Feel weak. Get help right away if you have:  Chest pain.  An irregular heartbeat.  A rapid heartbeat.  Difficulty breathing. Summary  Hypothyroidism is when the thyroid gland does not make enough of certain hormones (it is underactive).  When the thyroid is underactive, it produces too little of the hormones thyroxine (T4) and triiodothyronine (T3).  The most common cause is Hashimoto's disease, a disease in which the body's disease-fighting system (immune system) attacks the thyroid gland. The condition can also be caused by viral infections, medicine, pregnancy, or past   radiation treatment to the head or neck.  Symptoms may include weight gain, dry skin, constipation, feeling as though you do not have energy, and not being able to tolerate cold.  This condition is treated with medicine to replace the thyroid hormones that your body does not make. This information  is not intended to replace advice given to you by your health care provider. Make sure you discuss any questions you have with your health care provider. Document Released: 10/03/2005 Document Revised: 09/13/2017 Document Reviewed: 09/13/2017 Elsevier Interactive Patient Education  2019 [employer] Inc[employer]  

## 2019-03-26 DIAGNOSIS — I951 Orthostatic hypotension: Secondary | ICD-10-CM | POA: Diagnosis not present

## 2019-03-26 DIAGNOSIS — E039 Hypothyroidism, unspecified: Secondary | ICD-10-CM | POA: Diagnosis not present

## 2019-03-26 DIAGNOSIS — I10 Essential (primary) hypertension: Secondary | ICD-10-CM | POA: Diagnosis not present

## 2019-03-26 DIAGNOSIS — R413 Other amnesia: Secondary | ICD-10-CM | POA: Diagnosis not present

## 2019-03-26 DIAGNOSIS — G8929 Other chronic pain: Secondary | ICD-10-CM | POA: Diagnosis not present

## 2019-03-26 DIAGNOSIS — D472 Monoclonal gammopathy: Secondary | ICD-10-CM | POA: Diagnosis not present

## 2019-03-26 DIAGNOSIS — R51 Headache: Secondary | ICD-10-CM | POA: Diagnosis not present

## 2019-03-26 LAB — BMP8+EGFR
BUN/Creatinine Ratio: 18 (ref 12–28)
BUN: 22 mg/dL (ref 8–27)
CO2: 23 mmol/L (ref 20–29)
Calcium: 9.2 mg/dL (ref 8.7–10.3)
Chloride: 99 mmol/L (ref 96–106)
Creatinine, Ser: 1.25 mg/dL — ABNORMAL HIGH (ref 0.57–1.00)
GFR calc Af Amer: 46 mL/min/{1.73_m2} — ABNORMAL LOW (ref >59)
GFR calc non Af Amer: 40 mL/min/{1.73_m2} — ABNORMAL LOW (ref >59)
Glucose: 88 mg/dL (ref 65–99)
Potassium: 4.1 mmol/L (ref 3.5–5.2)
Sodium: 138 mmol/L (ref 134–144)

## 2019-03-26 LAB — TSH: TSH: 12.1 u[IU]/mL — ABNORMAL HIGH (ref 0.450–4.500)

## 2019-03-26 LAB — CBC
Hematocrit: 39.1 % (ref 34.0–46.6)
Hemoglobin: 13.2 g/dL (ref 11.1–15.9)
MCH: 30.3 pg (ref 26.6–33.0)
MCHC: 33.8 g/dL (ref 31.5–35.7)
MCV: 90 fL (ref 79–97)
Platelets: 178 10*3/uL (ref 150–450)
RBC: 4.35 x10E6/uL (ref 3.77–5.28)
RDW: 13.1 % (ref 11.7–15.4)
WBC: 4.1 10*3/uL (ref 3.4–10.8)

## 2019-03-27 ENCOUNTER — Ambulatory Visit (INDEPENDENT_AMBULATORY_CARE_PROVIDER_SITE_OTHER): Payer: Medicare HMO

## 2019-03-27 ENCOUNTER — Other Ambulatory Visit: Payer: Self-pay

## 2019-03-27 ENCOUNTER — Telehealth: Payer: Self-pay

## 2019-03-27 ENCOUNTER — Ambulatory Visit: Payer: Self-pay

## 2019-03-27 DIAGNOSIS — E039 Hypothyroidism, unspecified: Secondary | ICD-10-CM

## 2019-03-27 DIAGNOSIS — I10 Essential (primary) hypertension: Secondary | ICD-10-CM | POA: Diagnosis not present

## 2019-03-27 DIAGNOSIS — R413 Other amnesia: Secondary | ICD-10-CM

## 2019-03-27 NOTE — Chronic Care Management (AMB) (Signed)
Chronic Care Management    Clinical Social Work Follow Up Note  03/27/2019 Name: Brianna [name] MRN: [id number] DOB: Dec 10, 1932  Brianna [name] is a 85 y.o. year old female who is a primary care patient of [name], MD. The CCM team was consulted for assistance with care coordination.  Review of patient status, including review of consultants reports, other relevant assessments, and collaboration with appropriate care team members and the patient's provider was performed as part of comprehensive patient evaluation and provision of chronic care management services.     Collaborative call to the patient completed with CCM RN Case Manager on today's date.  Goals Addressed            This Visit's Progress     Patient Stated   . COMPLETED: "I want to name a power of attorney" (pt-stated) Not met- unable to progress       Current Barriers:  . Limited education about the importance of an advance directive and what a healthcare power of attorney is for . Memory Deficits  Clinical Social Work Clinical Goal(s):  [name] Over the next 30 days, patient will work with SW to address needs related to completing an advance directive  Interventions: . Outbound call to the patient to assess progression of patient stated goal . The patient is unable to progress with this goal due to an inability to recall education or understand document once explained  Patient Self Care Activities:  . Self administers medications as prescribed . Attends all scheduled provider appointments . Calls pharmacy for medication refills . Performs ADL's independently . Calls provider office for new concerns or questions  Please see past updates related to this goal by clicking on the "Past Updates" button in the selected goal      . "my memory is changing" (pt-stated)   Not on track    Current Barriers:  . Social Isolation . Memory Deficits . Lacks knowledge of signs and symptoms of dementia. Patient  reports beginning to become lost when driving home.  "Yesterday I got lost on my way home. I had to pull over and think for a few minutes about where I was going"  Clinical Social Work Clinical Goal(s):  [name] Over the next 30 days, patient will follow up with neurology  to address needs related to cognitive decline Not met re-established as a goal on 5/14 Goal not met. . 03/27/19- Over the next 30 days patient will be seen by a neurologist at [facility] in response to cognitive changes  CCM SW Interventions: Completed 03/27/19 . Outbound call to the patient to remind of [place] neurology appointment scheduled for June 11th . Performed chart review to find appointment had been rescheduled to July 1 . Reviewed appointment change with the patient, confirmed patient had changed date and time in her planner . Scheduled follow up call to the patient on June 30th to remind of appointment scheduled for July 1  Patient Self Care Activities:  . The patient attends provider appointments when needed . The patient calls provider office with new concerns . The patient utilizes a calendar to keep track of important appointments  Please see past updates related to this goal by clicking on the "Past Updates" button in the selected goal          Follow Up Plan: Appointment scheduled for SW follow up with client by phone on: June 30th   Daneen Schick, [place], CDP Social Worker, Teacher, adult education /  [place] [facility] [phone]  Total time spent performing care coordination and/or care management activities with the patient by phone or face to face = 26 minutes.

## 2019-03-27 NOTE — Patient Instructions (Signed)
Social Worker Visit Information  Goals we discussed today:  Goals Addressed            This Visit's Progress     Patient Stated   . COMPLETED: "I want to name a power of attorney" (pt-stated)       Current Barriers:  . Limited education about the importance of an advance directive and what a healthcare power of attorney is for . Memory Deficits  Clinical Social Work Clinical Goal(s):  [name] Over the next 30 days, patient will work with SW to address needs related to completing an advance directive  Interventions: . Outbound call to the patient to assess progression of patient stated goal . The patient is unable to progress with this goal due to an inability to recall education or understand document once explained  Patient Self Care Activities:  . Self administers medications as prescribed . Attends all scheduled provider appointments . Calls pharmacy for medication refills . Performs ADL's independently . Calls provider office for new concerns or questions  Please see past updates related to this goal by clicking on the "Past Updates" button in the selected goal      . "my memory is changing" (pt-stated)   Not on track    Current Barriers:  . Social Isolation . Memory Deficits . Lacks knowledge of signs and symptoms of dementia. Patient reports beginning to become lost when driving home.  "Yesterday I got lost on my way home. I had to pull over and think for a few minutes about where I was going"  Clinical Social Work Clinical Goal(s):  [name] Over the next 30 days, patient will follow up with neurology  to address needs related to cognitive decline Not met re-established as a goal on 5/14 Goal not met. . 03/27/19- Over the next 30 days patient will be seen by a neurologist at [facility] in response to cognitive changes  CCM SW Interventions: Completed 03/27/19 . Outbound call to the patient to remind of [place] neurology appointment scheduled for June  11th . Performed chart review to find appointment had been rescheduled to July 1 . Reviewed appointment change with the patient, confirmed patient had changed date and time in her planner . Scheduled follow up call to the patient on June 30th to remind of appointment scheduled for July 1  Patient Self Care Activities:  . The patient attends provider appointments when needed . The patient calls provider office with new concerns . The patient utilizes a calendar to keep track of important appointments  Please see past updates related to this goal by clicking on the "Past Updates" button in the selected goal          Follow Up Plan: Appointment scheduled for SW follow up with client by phone on: June 30th  Daneen Schick, Arita Miss, CDP Social Worker, Certified Dementia Practitioner [place] [facility] [address] [facility] [phone]

## 2019-03-28 ENCOUNTER — Ambulatory Visit: Payer: Medicare HMO | Admitting: Neurology

## 2019-03-28 NOTE — Chronic Care Management (AMB) (Signed)
Chronic Care Management   Follow Up Note   03/27/2019 Name: Brianna [name] MRN: [id number] DOB: [birth year]  Referred by: [name], MD Reason for referral : Chronic Care Management (CCM RNCM Telephone Follow Up)   Brianna [name] is a 85 y.o. year old female who is a primary care patient of [name], MD. The CCM team was consulted for assistance with chronic disease management and care coordination needs.    Review of patient status, including review of consultants reports, relevant laboratory and other test results, and collaboration with appropriate care team members and the patient's provider was performed as part of comprehensive patient evaluation and provision of chronic care management services.    A collaborative CCM call was placed to [name] today by telephone.   Goals Addressed      Patient Stated   . "I get confused on how to take my thyroid medication" (pt-stated)   On track    Current Barriers:  [name] Knowledge Deficit related to Hypothyroidism  . Impaired memory  Nurse Case Manager Clinical Goal(s):  [name] Over the next 90 days, patients thyroid levels will be within normal range. Goal Not Met . 03/29/19: Goal reestablished-Over the next 30 days, patient will report taking her Synthroid exactly as prescribed w/o missed or delayed doses.  [name] 03/29/19: Over the next 90 days, patient's thyroid level will be within normal range.    CCM RN CM Interventions:  Call completed with patient on 03/27/19:   . Assessed for adherence to following medication regimen for Synthroid as directed by [name] Cancer  (pt states she is adhering and is using her pill box to help her remember to take meds, she is taking thyroid medication in the am 1-2 hours before breakfast)  Reviewed recent med changes made on 03/26/19 per [name] Cancer; take Synthroid Monday through Saturday only skipping Sunday  Confirmed patient has a [place] RN coming in to supervise her medication management -  discussed having patient take all other meds in the am with breakfast 30 minutes after taking Synthroid for consistency and best effectiveness by establishing a routine that may help her avoid taking her medications at inconsistent times each day - pt verbalizes understanding and will give this a try . Discussed plans with patient for ongoing care management follow up and provided patient with direct contact information for care management team  Patient Self Care Activities:   Verbalizes understanding of the education/information provided today  Self administers medications as prescribed . Drives self and attends all scheduled provider appointments . Calls pharmacy for medication refills . Performs ADL's independently . Calls provider office for new concerns or questions  Please see past updates related to this goal by clicking on the "Past Updates" button in the selected goal     . COMPLETED: "I pretty much eat whatever I want" (pt-stated)       Current Barriers:  [name] Knowledge Deficits related to disease process and treament for Hyperlipidemia   Nurse Case Manager Clinical Goal(s):   Over the next 30 days, patient will report adhering to a heart healthy diet (low cholesterol, low fat, low sodium) Goal Met  CCM RN CM Interventions:  Completed call with patient on 03/27/19:    Reinforced importance of following a low sodium, low fat diet (pt reports adherence)  Confirmed patient received Meal Planning printed patient ed mats via mail - pt denies questions   No further follow up needed at this time   Patient Self Care  Activities:   Verbalizes understanding of the education/information provided today  Self administers medications as prescribed . Drives self and attends all scheduled provider appointments . Calls pharmacy for medication refills . Performs ADL's independently . Calls provider office for new concerns or questions   Please see past updates related to this goal  by clicking on the "Past Updates" button in the selected goal     . "my memory is changing" (pt-stated)       Current Barriers:  . Social Isolation . Memory Deficits . Lacks knowledge of signs and symptoms of dementia. Patient reports beginning to become lost when driving home.  "Yesterday I got lost on my way home. I had to pull over and think for a few minutes about where I was going"  Clinical Social Work Clinical Goal(s):  [name] Over the next 30 days, patient will follow up with neurology  to address needs related to cognitive decline Not met re-established as a goal on 5/14 Goal not met. . 03/27/19- Over the next 30 days patient will be seen by a neurologist at [facility] in response to cognitive changes  CCM SW Interventions: Completed 03/27/19 . Outbound call to the patient to remind of [place] neurology appointment scheduled for June 11th . Performed chart review to find appointment had been rescheduled to July 1 . Reviewed appointment change with the patient, confirmed patient had changed date and time in her planner . Scheduled follow up call to the patient on June 30th to remind of appointment scheduled for July 1  CCM RN CM Interventions:  Completed call with patient on 03/27/19:   . Assessed for start of [facility] services; pt confirms having a HH SN visit for medication management  . Reviewed referral for [facility] services entered by PCP; noted ST is not listed . Informed patient RNCM will ask [name] Cancer to provide an order for ST services to provide help with impaired memory - pt verbalizes understanding and is agreeable  . Sent message to [name] Cancer requesting ST be added to [facility]'s [facility] services for brain mnemonics - [name] Cancer will send the order  Patient Self Care Activities:  . The patient attends provider appointments when needed . The patient calls provider office with new concerns . The patient utilizes a calendar to keep track of important appointments   Please see past updates related to this goal by clicking on the "Past Updates" button in the selected goal         The care management team will reach out to the patient again over the next 7-14 days.    Barb Merino, RN,CCM  Care Management Coordinator [place] [facility]  Direct Phone: [phone]

## 2019-03-28 NOTE — Patient Instructions (Signed)
Visit Information  Goals Addressed      Patient Stated   . "I get confused on how to take my thyroid medication" (pt-stated)   On track    Current Barriers:  [name] Knowledge Deficit related to Hypothyroidism  . Impaired memory  Nurse Case Manager Clinical Goal(s):  [name] Over the next 90 days, patients thyroid levels will be within normal range. Goal Not Met . 03/29/19: Goal reestablished-Over the next 30 days, patient will report taking her Synthroid exactly as prescribed w/o missed or delayed doses.  [name] 03/29/19: Over the next 90 days, patient's thyroid level will be within normal range.    CCM RN CM Interventions:  Call completed with patient on 03/27/19:   . Assessed for adherence to following medication regimen for Synthroid as directed by [name] Cancer  (pt states she is adhering and is using her pill box to help her remember to take meds, she is taking thyroid medication in the am 1-2 hours before breakfast)  Reviewed recent med changes made on 03/26/19 per [name] Cancer; take Synthroid Monday through Saturday only skipping Sunday  Confirmed patient has a [place] RN coming in to supervise her medication management - discussed having patient take all other meds in the am with breakfast 30 minutes after taking Synthroid for consistency and best effectiveness by establishing a routine that may help her avoid taking her medications at inconsistent times each day - pt verbalizes understanding and will give this a try . Discussed plans with patient for ongoing care management follow up and provided patient with direct contact information for care management team  Patient Self Care Activities:   Verbalizes understanding of the education/information provided today  Self administers medications as prescribed . Drives self and attends all scheduled provider appointments . Calls pharmacy for medication refills . Performs ADL's independently . Calls provider office for new concerns or questions  Please  see past updates related to this goal by clicking on the "Past Updates" button in the selected goal     . COMPLETED: "I pretty much eat whatever I want" (pt-stated)       Current Barriers:  [name] Knowledge Deficits related to disease process and treament for Hyperlipidemia   Nurse Case Manager Clinical Goal(s):   Over the next 30 days, patient will report adhering to a heart healthy diet (low cholesterol, low fat, low sodium) Goal Met  CCM RN CM Interventions:  Completed call with patient on 03/27/19:    Reinforced importance of following a low sodium, low fat diet (pt reports adherence)  Confirmed patient received Meal Planning printed patient ed mats via mail - pt denies questions   No further follow up needed at this time  Patient Self Care Activities:   Verbalizes understanding of the education/information provided today  Self administers medications as prescribed . Drives self and attends all scheduled provider appointments . Calls pharmacy for medication refills . Performs ADL's independently . Calls provider office for new concerns or questions  Please see past updates related to this goal by clicking on the "Past Updates" button in the selected goal     . "my memory is changing" (pt-stated)       Current Barriers:  . Social Isolation . Memory Deficits . Lacks knowledge of signs and symptoms of dementia. Patient reports beginning to become lost when driving home.  "Yesterday I got lost on my way home. I had to pull over and think for a few minutes about where I was going"  Clinical Social Work Clinical Goal(s):  [name] Over the next 30 days, patient will follow up with neurology  to address needs related to cognitive decline Not met re-established as a goal on 5/14 Goal not met. . 03/27/19- Over the next 30 days patient will be seen by a neurologist at [facility] in response to cognitive changes  CCM SW Interventions: Completed 03/27/19 . Outbound call to  the patient to remind of [place] neurology appointment scheduled for June 11th . Performed chart review to find appointment had been rescheduled to July 1 . Reviewed appointment change with the patient, confirmed patient had changed date and time in her planner . Scheduled follow up call to the patient on June 30th to remind of appointment scheduled for July 1  CCM RN CM Interventions:  Completed call with patient on 03/27/19:   . Assessed for start of [facility] services; pt confirms having a HH SN visit for medication management  . Reviewed referral for [facility] services entered by PCP; noted ST is not listed . Informed patient RNCM will ask [name] Cancer to provide an order for ST services to provide help with impaired memory - pt verbalizes understanding and is agreeable  . Sent message to [name] Cancer requesting ST be added to patient's [facility] services for brain mnemonics - [name] Cancer will send the order  Patient Self Care Activities:  . The patient attends provider appointments when needed . The patient calls provider office with new concerns . The patient utilizes a calendar to keep track of important appointments  Please see past updates related to this goal by clicking on the "Past Updates" button in the selected goal        The patient verbalized understanding of instructions provided today and declined a print copy of patient instruction materials.   The care management team will reach out to the patient again over the next 7-14 days.   Barb Merino, RN,CCM Care Management Coordinator [place] [facility]  Direct Phone: [phone]

## 2019-03-30 NOTE — Progress Notes (Signed)
Subjective:     Patient ID: Brianna [name] , female    DOB: 1933/09/09 , 85 y.o.   MRN: [id number]   Chief Complaint  Patient presents with  . Hypothyroidism  . Dizziness    HPI  She is here today for a thyroid check. She has been taking levothyroxine as instructed - M-Friday.   Dizziness This is a recurrent problem. The current episode started more than 1 month ago. The problem occurs intermittently. The problem has been gradually improving. Pertinent negatives include no headaches, nausea, visual change or vomiting. The symptoms are aggravated by bending. She has tried nothing for the symptoms.     Past Medical History:  Diagnosis Date  . Headache 12/24/2013  . Headache(784.0)   . Hypertension    pcp  [facility]  . Hypothyroid   . MGUS (monoclonal gammopathy of unknown significance) 12/10/2013     Family History  Problem Relation Age of Onset  . Cancer Maternal Grandfather        stomach cancer  . Cancer Paternal Grandfather        stomach cancer  . Healthy Mother   . Healthy Father      Current Outpatient Medications:  .  amLODipine (NORVASC) 5 MG tablet, TAKE 1 TABLET BY MOUTH EVERY DAY, Disp: 90 tablet, Rfl: 1 .  aspirin 81 MG tablet, Take 81 mg by mouth daily., Disp: , Rfl:  .  Cholecalciferol (VITAMIN D3) 125 MCG (5000 UT) CAPS, Take 5,000 capsules by mouth daily. , Disp: , Rfl:  .  fish oil-omega-3 fatty acids 1000 MG capsule, Take 1 g by mouth daily. , Disp: , Rfl:  .  furosemide (LASIX) 40 MG tablet, Take 40 mg by mouth daily as needed for fluid or edema., Disp: , Rfl:  .  Multiple Vitamin (MULTIVITAMIN WITH MINERALS) TABS, Take 1 tablet by mouth daily. , Disp: , Rfl:  .  SYNTHROID 125 MCG tablet, Monday - Saturday, Disp: , Rfl:  .  meclizine (ANTIVERT) 12.5 MG tablet, Take 1 tablet (12.5 mg total) by mouth 3 (three) times daily as needed for dizziness. (Patient not taking: Reported on 02/25/2019), Disp: 21 tablet, Rfl: 0 .  senna (SENOKOT) 8.6 MG TABS  tablet, Take 1 tablet by mouth daily as needed for mild constipation., Disp: , Rfl:    Allergies  Allergen Reactions  . Iodine Other (See Comments)    Reaction unknown  . Sulfa Antibiotics Other (See Comments)    Childhood reaction     Review of Systems  Constitutional: Negative.   Respiratory: Negative.   Cardiovascular: Negative.   Gastrointestinal: Negative.  Negative for nausea and vomiting.  Neurological: Positive for dizziness. Negative for headaches.  Psychiatric/Behavioral: Negative.      Today's Vitals   03/25/19 1217 03/25/19 1219 03/25/19 1220 03/25/19 1257  BP: 126/78 118/72 108/66 140/82  Pulse: 73 (!) 58 68   Temp: 97.8 F (36.6 C)     TempSrc: Oral     Weight: 155 lb (70.3 kg)     Height: '5\' 5"'  (1.651 m)     PainSc: 8      PainLoc: Head      Body mass index is 25.79 kg/m.   Objective:  Physical Exam Vitals signs and nursing note reviewed.  Constitutional:      Appearance: Normal appearance.  HENT:     Head: Normocephalic and atraumatic.     Right Ear: Tympanic membrane, ear canal and external ear normal. There is no impacted  cerumen.     Left Ear: Tympanic membrane, ear canal and external ear normal. There is no impacted cerumen.  Cardiovascular:     Rate and Rhythm: Normal rate and regular rhythm.     Heart sounds: Normal heart sounds.  Pulmonary:     Effort: Pulmonary effort is normal.     Breath sounds: Normal breath sounds.  Skin:    General: Skin is warm.  Neurological:     General: No focal deficit present.     Mental Status: She is alert.  Psychiatric:        Mood and Affect: Mood normal.        Behavior: Behavior normal.         Assessment And Plan:     1. Primary hypothyroidism  I will check thyroid panel and adjust meds as needed.  - TSH  2. Dizziness  Recurrent. I suspect this is partially related to dehydration. She is encouraged to strive for 48-64 ounces of water daily.   - CBC no Diff  3. Essential  hypertension  Fair control. She is encouraged to take meds daily as prescribed. We reviewed each of her meds in full detail.   - BMP8+EGFR        Maximino Greenland, MD    THE PATIENT IS ENCOURAGED TO PRACTICE SOCIAL DISTANCING DUE TO THE COVID-19 PANDEMIC.

## 2019-04-03 ENCOUNTER — Telehealth: Payer: Self-pay

## 2019-04-16 ENCOUNTER — Ambulatory Visit: Payer: Self-pay

## 2019-04-16 ENCOUNTER — Telehealth: Payer: Self-pay

## 2019-04-16 ENCOUNTER — Other Ambulatory Visit: Payer: Self-pay

## 2019-04-16 DIAGNOSIS — G8929 Other chronic pain: Secondary | ICD-10-CM | POA: Diagnosis not present

## 2019-04-16 DIAGNOSIS — I10 Essential (primary) hypertension: Secondary | ICD-10-CM | POA: Diagnosis not present

## 2019-04-16 DIAGNOSIS — E039 Hypothyroidism, unspecified: Secondary | ICD-10-CM | POA: Diagnosis not present

## 2019-04-16 DIAGNOSIS — D472 Monoclonal gammopathy: Secondary | ICD-10-CM | POA: Diagnosis not present

## 2019-04-16 DIAGNOSIS — R51 Headache: Secondary | ICD-10-CM | POA: Diagnosis not present

## 2019-04-16 DIAGNOSIS — R413 Other amnesia: Secondary | ICD-10-CM

## 2019-04-16 DIAGNOSIS — I951 Orthostatic hypotension: Secondary | ICD-10-CM | POA: Diagnosis not present

## 2019-04-16 NOTE — Patient Instructions (Addendum)
Visit Information  Goals Addressed      Patient Stated   . "I get confused on how to take my thyroid medication" (pt-stated)       Current Barriers:  [name] Knowledge Deficit related to Hypothyroidism  . Impaired memory  Nurse Case Manager Clinical Goal(s):  [name] Over the next 90 days, patients thyroid levels will be within normal range. Goal Not Met . 03/29/19: Goal reestablished-Over the next 30 days, patient will report taking her Synthroid exactly as prescribed w/o missed or delayed doses.  [name] 03/29/19: Over the next 90 days, patient's thyroid level will be within normal range.    CCM RN CM Interventions:  Call completed with patient on 04/16/19  . Assessed for adherence to following medication regimen for Synthroid as directed by [name] Cancer  (pt states she is adhering and is using her pill box to help her remember to take meds, she is taking thyroid medication in the am 1-2 hours before breakfast)  Reinforced following recent med changes made on 03/26/19 per [name] Cancer; take Synthroid Monday through Saturday only skipping Sunday  Discussed patient did not receive a SNV for the last 2 weeks; discussed she did not get a call or a visit from the nurse  Placed outbound call to [facility] [phone], spoke with Margreta Journey, she confirmed April RN is scheduled to see Ms. Whitsel today, she stated last week April could not reach Ms. Pavon by phone on Wednesday in order to schedule her visit  Placed outbound call to patient to notify her the nurse is scheduled for a visit today and was unable to reach her by phone last week to schedule a visit  Discussed patient is making an effort to drink more water; states she keeps a bottle of water with her at all times to help her remember to drink  Positive reinforcement given to patient for increasing her water intake; discussed patient may also eat popsicles, jello and low sodium chicken broth to help her take in fluids to help with hydration - pt  verbalizes understanding  . Discussed plans with patient for ongoing care management follow up and provided patient with direct contact information for care management team  Patient Self Care Activities:   Verbalizes understanding of the education/information provided today  Self administers medications as prescribed . Drives self and attends all scheduled provider appointments . Calls pharmacy for medication refills . Performs ADL's independently . Calls provider office for new concerns or questions  Please see past updates related to this goal by clicking on the "Past Updates" button in the selected goal        The patient verbalized understanding of instructions provided today and declined a print copy of patient instruction materials.   The care management team will reach out to the patient again over the next 7-14 days.    Barb Merino, RN, BSN, CCM Care Management Coordinator [place] [facility]  Direct Phone: [phone]

## 2019-04-16 NOTE — Chronic Care Management (AMB) (Signed)
  Chronic Care [name]   Social Work Follow Up Note  04/16/2019 Name: Brianna [name] MRN: [id number] DOB: 1933-07-15  Brianna [name] is a 85 y.o. year old female who is a primary care patient of Brianna Chard, MD. The CCM team was consulted for assistance with Level of Care Concerns.   Review of patient status, including review of consultants reports, other relevant assessments, and collaboration with appropriate care team members and the patient's provider was performed as part of comprehensive patient evaluation and provision of chronic care [name] services.     Collaborative call performed with CCM RN Case Manger Brianna [name] on today's date.  Goals Addressed            This Visit's Progress     Patient Stated   . "my memory is changing" (pt-stated)   On track    Current Barriers:  . Social Isolation . Memory Deficits . Lacks knowledge of signs and symptoms of dementia. Patient reports beginning to become lost when driving home.  "Yesterday I got lost on my way home. I had to pull over and think for a few minutes about where I was going"  Clinical Social Work Clinical Goal(s):  Brianna [name] Kitchen Over the next 30 days, patient will follow up with neurology  to address needs related to cognitive decline Not met re-established as a goal on 5/14 Goal not met. . 03/27/19- Over the next 30 days patient will be seen by a neurologist at [name] in response to cognitive changes  CCM SW Interventions: Completed 04/16/19 . Outbound call to the patient to remind of Brianna [name] neurology appointment scheduled for July 1 . Confirmed the patient is able to report appointment information to be written on her calendar . Encouraged the patient to have her son attend the appointment with her . Scheduled follow up call to the patient within the next three weeks to review exam findings and discuss any interventions suggested by neurologist   Patient Self Care Activities:  . The  patient attends provider appointments when needed . The patient calls provider office with new concerns . The patient utilizes a calendar to keep track of important appointments  Please see past updates related to this goal by clicking on the "Past Updates" button in the selected goal          Follow Up Plan: SW will follow up with patient by phone over the next three weeks   Brianna [name], Brianna [name], Brianna [name] Social Worker, Certified Dementia Practitioner Brianna [name] [phone]  Total time spent performing care coordination and/or care [name] activities with the patient by phone or face to face = 12 minutes.

## 2019-04-16 NOTE — Patient Instructions (Signed)
Social Worker Visit Information  Goals we discussed today:  Goals Addressed            This Visit's Progress     Patient Stated   . "my memory is changing" (pt-stated)   On track    Current Barriers:  . Social Isolation . Memory Deficits . Lacks knowledge of signs and symptoms of dementia. Patient reports beginning to become lost when driving home.  "Yesterday I got lost on my way home. I had to pull over and think for a few minutes about where I was going"  Clinical Social Work Clinical Goal(s):  [name] Over the next 30 days, patient will follow up with neurology  to address needs related to cognitive decline Not met re-established as a goal on 5/14 Goal not met. . 03/27/19- Over the next 30 days patient will be seen by a neurologist at [facility] in response to cognitive changes  CCM SW Interventions: Completed 04/16/19 . Outbound call to the patient to remind of [place] neurology appointment scheduled for July 1 . Confirmed the patient is able to report appointment information to be written on her calendar . Encouraged the patient to have her son attend the appointment with her . Scheduled follow up call to the patient within the next three weeks to review exam findings and discuss any interventions suggested by neurologist  Patient Self Care Activities:  . The patient attends provider appointments when needed . The patient calls provider office with new concerns . The patient utilizes a calendar to keep track of important appointments  Please see past updates related to this goal by clicking on the "Past Updates" button in the selected goal          Materials Provided: Yes: verbal reminder of [place] neurology appointment  Follow Up Plan: SW will follow up with patient by phone over the next 3 weeks   Daneen Schick, BSW, CDP Social Worker, Certified Dementia Practitioner [place] / [place] [facility] [phone]

## 2019-04-16 NOTE — Chronic Care Management (AMB) (Signed)
Chronic Care Management   Follow Up Note   04/16/2019 Name: Brianna [name] MRN: [id number] DOB: 11/04/1932  Referred by: [name], MD Reason for referral : Chronic Care Management (CCM RNCM Telephone Follow Up )   Brianna [name] is a 85 y.o. year old female who is a primary care patient of [name], MD. The CCM team was consulted for assistance with chronic disease management and care coordination needs.    Review of patient status, including review of consultants reports, relevant laboratory and other test results, and collaboration with appropriate care team members and the patient's provider was performed as part of comprehensive patient evaluation and provision of chronic care management services.    Completed collaborative call with Ms. Hirata and embedded BSW Daneen Schick today to f/u on patient's [facility] RN visits for medication management.   Goals Addressed      Patient Stated   . "I get confused on how to take my thyroid medication" (pt-stated)       Current Barriers:  [name] Knowledge Deficit related to Hypothyroidism  . Impaired memory  Nurse Case Manager Clinical Goal(s):  [name] Over the next 90 days, patients thyroid levels will be within normal range. Goal Not Met . 03/29/19: Goal reestablished-Over the next 30 days, patient will report taking her Synthroid exactly as prescribed w/o missed or delayed doses.  [name] 03/29/19: Over the next 90 days, patient's thyroid level will be within normal range.    CCM RN CM Interventions:  Call completed with patient on 04/16/19:   . Assessed for adherence to following medication regimen for Synthroid as directed by [name] Cancer  (pt states she is adhering and is using her pill box to help her remember to take meds, she is taking thyroid medication in the am 1-2 hours before breakfast)  Reinforced following recent med changes made on 03/26/19 per [name] Cancer; take Synthroid Monday through Saturday only skipping Sunday   Discussed patient did not receive a SNV for the last 2 weeks; discussed she did not get a call or a visit from the nurse  Placed outbound call to [facility] [phone], spoke with Margreta Journey, she confirmed April RN is scheduled to see Ms. Krabill today, she stated last week April could not reach Ms. Prien by phone on Wednesday in order to schedule her visit  Placed outbound call to patient to notify her the nurse is scheduled for a visit today and was unable to reach her by phone last week to schedule a visit  Discussed patient is making an effort to drink more water; states she keeps a bottle of water with her at all times to help her remember to drink  Positive reinforcement given to patient for increasing her water intake; discussed patient may also eat popsicles, jello and low sodium chicken broth to help her take in fluids to help with hydration - pt verbalizes understanding   Discussed plans with patient for ongoing care management follow up and provided patient with direct contact information for care management team  Patient Self Care Activities:   Verbalizes understanding of the education/information provided today  Self administers medications as prescribed . Drives self and attends all scheduled provider appointments . Calls pharmacy for medication refills . Performs ADL's independently . Calls provider office for new concerns or questions  Please see past updates related to this goal by clicking on the "Past Updates" button in the selected goal         The care  management team will reach out to the patient again over the next 7-14 days.    Barb Merino, RN, BSN, CCM Care Management Coordinator [place] [facility]  Direct Phone: [phone]

## 2019-04-17 ENCOUNTER — Encounter: Payer: Self-pay | Admitting: Neurology

## 2019-04-17 ENCOUNTER — Ambulatory Visit (INDEPENDENT_AMBULATORY_CARE_PROVIDER_SITE_OTHER): Payer: Medicare HMO | Admitting: Neurology

## 2019-04-17 DIAGNOSIS — G3184 Mild cognitive impairment, so stated: Secondary | ICD-10-CM | POA: Diagnosis not present

## 2019-04-17 NOTE — Progress Notes (Signed)
[facility]    Provider:  Dr Jaynee Eagles Requesting Provider: [name], MD Primary Care Provider:  [name], MD  CC:  Memory loss  HPI:  Brianna [name] is a 85 y.o. female here as requested by [name], MD for memory loss. PMHx HTN, headache, hypothyroid, MGUS, orthostatic hypotension. Patient here with son. She says she has memory loss. She lives with her son. He cares for his mom, it is a split level. She has been getting services from CCM to help with management of her medication and other aspects of life. She can perform her own ADLs but difficulty with IADLs and medication management and getting lost recently. Starting to get lost driving, recommend driving. She says she noticed some short-term memory changes. She may say the same things over and over again, son denies any significant problems. She did get lost one day, son says this is nothing new but she says she had to stop at a store to think about how to get home. She doesn't drive as much as she used to. Her son drives her. Her mother had dementia in her 41s. They are not interested. Son thinks it has to do with hearing. Slowly progressive. No accidents at home.Son helps her with all the IADLs. Family provides most information.    Reviewed notes, labs and imaging from outside physicians, which showed: I reviewed [name] Cancer notes, she has been recently working with CCM for medication compliance, assistance with chronic disease management, care coordination needs. She is having difficulty managing her own medications. She administers her own meds, srives self to appointments, calls pharmacy for medication refills, performs ADLs independently. Diet was discussed. Barriers include Social isolation, memory deficits, lacks knowledge of signs ans symptoms of dementia and starting to get lost driving home. They encouraged son to attend appointment.   MRI brain 08/2018: personally reviewed and agree with the  following: 1.  No acute intracranial abnormality. 2. Mild for age signal changes compatible with chronic small vessel disease in the brain, stable to minimally progressed since 2016.  TSH 12.1 (being addressed), Creat abd BUN 1.25, 22 RPR 1:2 T Pall Abs NR B12 541 Review of Systems: Patient complains of symptoms per HPI as well as the following symptoms: memory loss. Pertinent negatives and positives per HPI. All others negative.   Social History   Socioeconomic History  . Marital status: Widowed    Spouse name: Not on file  . Number of children: 3  . Years of education: 17.5-18   . Highest education level: Bachelor's degree (e.g., BA, AB, BS)  Occupational History  . Not on file  Social Needs  . Financial resource strain: Not hard at all  . Food insecurity    Worry: Never true    Inability: Never true  . Transportation needs    Medical: No    Non-medical: No  Tobacco Use  . Smoking status: Never Smoker  . Smokeless tobacco: Never Used  Substance and Sexual Activity  . Alcohol use: Yes    Comment: socially  . Drug use: No  . Sexual activity: Not on file  Lifestyle  . Physical activity    Days per week: 7 days    Minutes per session: 40 min  . Stress: Only a little  Relationships  . Social connections    Talks on phone: More than three times a week    Gets together: More than three times a week    Attends religious service: Never  Active member of club or organization: Yes    Attends meetings of clubs or organizations: More than 4 times per year    Relationship status: Widowed  . Intimate partner violence    Fear of current or ex partner: Not on file    Emotionally abused: Not on file    Physically abused: Not on file    Forced sexual activity: Not on file  Other Topics Concern  . Not on file  Social History Narrative   Lives at home. Her sons live with her.    Right handed    Family History  Problem Relation Age of Onset  . Cancer Maternal Grandfather         stomach cancer  . Cancer Paternal Grandfather        stomach cancer  . Healthy Mother   . Dementia Mother        at 96-99 y.o  . Healthy Father     Past Medical History:  Diagnosis Date  . Headache 12/24/2013  . Headache(784.0)   . Hypertension    pcp  [facility]  . Hypothyroid   . MGUS (monoclonal gammopathy of unknown significance) 12/10/2013    Patient Active Problem List   Diagnosis Date Noted  . MCI (mild cognitive impairment) 04/18/2019  . Chronic headache 07/24/2014  . Essential hypertension 12/24/2013  . MGUS (monoclonal gammopathy of unknown significance) 12/10/2013    Past Surgical History:  Procedure Laterality Date  . ABDOMINAL HYSTERECTOMY    . bladder tac    . BREAST BIOPSY Right 04/2017  . CATARACT EXTRACTION W/PHACO  08/22/2012   Procedure: CATARACT EXTRACTION PHACO AND INTRAOCULAR LENS PLACEMENT (IOC);  Surgeon: Adonis Brook, MD;  Location: [place];  Service: Ophthalmology;  Laterality: Left;  . CATARACT EXTRACTION W/PHACO Right 02/13/2013   Procedure: CATARACT EXTRACTION PHACO AND INTRAOCULAR LENS PLACEMENT (IOC);  Surgeon: Adonis Brook, MD;  Location: [place];  Service: Ophthalmology;  Laterality: Right;    Current Outpatient Medications  Medication Sig Dispense Refill  . amLODipine (NORVASC) 5 MG tablet TAKE 1 TABLET BY MOUTH EVERY DAY 90 tablet 1  . aspirin 81 MG tablet Take 81 mg by mouth daily.    . Cholecalciferol (VITAMIN D3) 125 MCG (5000 UT) CAPS Take 5,000 Units by mouth daily.     . furosemide (LASIX) 40 MG tablet Take 40 mg by mouth daily as needed for fluid or edema.    . Multiple Vitamin (MULTIVITAMIN WITH MINERALS) TABS Take 1 tablet by mouth daily.     [name] senna (SENOKOT) 8.6 MG TABS tablet Take 1 tablet by mouth daily as needed for mild constipation.    [name] SYNTHROID 125 MCG tablet Monday - Saturday    . fish oil-omega-3 fatty acids 1000 MG capsule Take 1 g by mouth daily.     . meclizine (ANTIVERT) 12.5 MG tablet Take 1 tablet (12.5  mg total) by mouth 3 (three) times daily as needed for dizziness. (Patient not taking: Reported on 02/25/2019) 21 tablet 0   No current facility-administered medications for this visit.     Allergies as of 04/17/2019 - Review Complete 04/17/2019  Allergen Reaction Noted  . Iodine Other (See Comments) 05/11/2012  . Sulfa antibiotics Other (See Comments) 05/11/2012    Vitals: BP (!) 148/71 (BP Location: Right Arm, Patient Position: Sitting)   Pulse (!) 59   Temp 97.8 F (36.6 C) Comment: son 97.8.  Ht 5\' 5"  (1.651 m)   Wt 158 lb (71.7 kg)  BMI 26.29 kg/m  Last Weight:  Wt Readings from Last 1 Encounters:  04/17/19 158 lb (71.7 kg)   Last Height:   Ht Readings from Last 1 Encounters:  04/17/19 5\' 5"  (1.651 m)     Physical exam: Exam: Gen: NAD                     CV: RRR, no MRG. No Carotid Bruits. No peripheral edema, warm, nontender Eyes: Conjunctivae clear without exudates or hemorrhage  Neuro: Detailed Neurologic Exam  Speech:    Speech is normal; fluent and spontaneous with normal comprehension.  Cognition: MMSE - Mini Mental State Exam 04/17/2019  Orientation to time 4  Orientation to Place 5  Registration 3  Attention/ Calculation 1  Recall 0  Language- name 2 objects 2  Language- repeat 0  Language- follow 3 step command 3  Language- read & follow direction 1  Write a sentence 1  Copy design 1  Total score 21        The patient is oriented to person, place, and time;     recent and remote memory impaired;     language fluent;     Impaired attention, concentration, fund of knowledge Cranial Nerves:    The pupils are equal, round, and reactive to light.Attempted fundoscopic exam could not visualize. . Visual fields are full to finger confrontation. Extraocular movements are intact. Trigeminal sensation is intact and the muscles of mastication are normal. The face is symmetric. The palate elevates in the midline. Hearing impaired. Voice is normal.  Shoulder shrug is normal. The tongue has normal motion without fasciculations.   Coordination:    Normal finger to nose and heel to shin.  Gait:    Heel-toe and tandem gait are normal.   Motor Observation:    No asymmetry, no atrophy, and no involuntary movements noted. Tone:    Normal muscle tone.    Posture:    Posture is normal. normal erect    Strength:    Strength is V/V in the upper and lower limbs.      Sensation: intact to LT     Reflex Exam:  DTR's:    Deep tendon reflexes in the upper and lower extremities are brisk bilaterally.   Toes:    The toes are equiv bilaterally.   Clonus:    Clonus is absent.    Assessment/Plan:  [age] year old here with her son who both provide much information. 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 However she denies anything significant, son denies any significant problems and they decline any further testing. MRI brain was unremarkable. MMSE showed 21/30 which does show mild to moderate  cognitive impairment. I recommend limiting driving. Son lives with her and needs to continue helping. She should continue getting resources with CCM as long as she can and social work. We will follow clinically. They decline any medication such as Aricept. Limit driving to close familiar places within 2-3 miles and only during day. We will schedule a follow up.  A total of 60 minutes was spent face-to-face with this patient. Over half this time was spent on counseling patient on the  1. MCI (mild cognitive impairment)    diagnosis and different diagnostic and therapeutic options, counseling and coordination of care, risks ans benefits of management, compliance, or risk factor reduction and education.      Cc: [name], MD  Sarina Ill, MD  [facility] [address] [place],  [address]  Phone [phone] Fax  [phone]

## 2019-04-17 NOTE — Patient Instructions (Signed)

## 2019-04-18 DIAGNOSIS — F0390 Unspecified dementia without behavioral disturbance: Secondary | ICD-10-CM | POA: Insufficient documentation

## 2019-04-18 DIAGNOSIS — G3184 Mild cognitive impairment, so stated: Secondary | ICD-10-CM | POA: Insufficient documentation

## 2019-04-23 ENCOUNTER — Other Ambulatory Visit: Payer: Self-pay

## 2019-04-23 DIAGNOSIS — I10 Essential (primary) hypertension: Secondary | ICD-10-CM | POA: Diagnosis not present

## 2019-04-23 DIAGNOSIS — D472 Monoclonal gammopathy: Secondary | ICD-10-CM | POA: Diagnosis not present

## 2019-04-23 DIAGNOSIS — G8929 Other chronic pain: Secondary | ICD-10-CM | POA: Diagnosis not present

## 2019-04-23 DIAGNOSIS — R413 Other amnesia: Secondary | ICD-10-CM | POA: Diagnosis not present

## 2019-04-23 DIAGNOSIS — I951 Orthostatic hypotension: Secondary | ICD-10-CM | POA: Diagnosis not present

## 2019-04-23 DIAGNOSIS — R51 Headache: Secondary | ICD-10-CM | POA: Diagnosis not present

## 2019-04-23 DIAGNOSIS — E039 Hypothyroidism, unspecified: Secondary | ICD-10-CM | POA: Diagnosis not present

## 2019-04-23 MED ORDER — SYNTHROID 125 MCG PO TABS: ORAL_TABLET | ORAL | 90 refills | Status: DC

## 2019-04-24 ENCOUNTER — Telehealth: Payer: Self-pay

## 2019-05-01 ENCOUNTER — Telehealth: Payer: Self-pay

## 2019-05-02 ENCOUNTER — Telehealth: Payer: Self-pay

## 2019-05-02 ENCOUNTER — Ambulatory Visit: Payer: Self-pay

## 2019-05-02 DIAGNOSIS — I10 Essential (primary) hypertension: Secondary | ICD-10-CM

## 2019-05-02 DIAGNOSIS — R413 Other amnesia: Secondary | ICD-10-CM

## 2019-05-02 NOTE — Chronic Care Management (AMB) (Signed)
  Chronic Care Management   Outreach Note  05/02/2019 Name: Brianna [name] MRN: [id number] DOB: 20-Jul-1933  Referred by: [name], MD Reason for referral : Care Coordination   CCM SW placed an unsuccessful outreach call to the patient to follow up on outcome of recent neurology visit. Unfortunately, the patient does not have a voice mailbox set-up which prohibited CCM SW from leaving a voice message requesting a return call. CCM SW attempted the patient outreach by placing two back to back calls as the patient has indicated to do this in the past considering she does not answer unfamiliar numbers.  Follow Up Plan: CCM SW will attempt to outreach the patient over the next 2-3 weeks.  Daneen Schick, BSW, CDP Social Worker, Certified Dementia Practitioner [place] / [place] [facility] [phone]  Total time spent performing care coordination and/or care management activities with the patient by phone or face to face = 5 minutes.

## 2019-05-07 DIAGNOSIS — R413 Other amnesia: Secondary | ICD-10-CM | POA: Diagnosis not present

## 2019-05-07 DIAGNOSIS — I951 Orthostatic hypotension: Secondary | ICD-10-CM | POA: Diagnosis not present

## 2019-05-07 DIAGNOSIS — I10 Essential (primary) hypertension: Secondary | ICD-10-CM | POA: Diagnosis not present

## 2019-05-07 DIAGNOSIS — R51 Headache: Secondary | ICD-10-CM | POA: Diagnosis not present

## 2019-05-07 DIAGNOSIS — G8929 Other chronic pain: Secondary | ICD-10-CM | POA: Diagnosis not present

## 2019-05-07 DIAGNOSIS — E039 Hypothyroidism, unspecified: Secondary | ICD-10-CM | POA: Diagnosis not present

## 2019-05-07 DIAGNOSIS — D472 Monoclonal gammopathy: Secondary | ICD-10-CM | POA: Diagnosis not present

## 2019-05-14 ENCOUNTER — Ambulatory Visit: Payer: Self-pay

## 2019-05-14 ENCOUNTER — Telehealth: Payer: Self-pay

## 2019-05-14 DIAGNOSIS — E039 Hypothyroidism, unspecified: Secondary | ICD-10-CM

## 2019-05-14 DIAGNOSIS — R413 Other amnesia: Secondary | ICD-10-CM

## 2019-05-14 DIAGNOSIS — I10 Essential (primary) hypertension: Secondary | ICD-10-CM

## 2019-05-14 NOTE — Chronic Care Management (AMB) (Signed)
  Chronic Care Management   Social Work Note  05/14/2019 Name: [name] MRN: [id number] DOB: 1933/10/17  Jorene Minors Record is a 85 y.o. year old female who sees [name], MD for primary care. The CCM team was consulted for assistance with [employer].   Second unsuccessful call placed to the patient to follow up on outcome of recent neurologist appointment. Unfortunately, the patient did not answer with no option to leave a voice message.  Follow Up Plan: SW will attempt a third and final outreach call to the patient over the next two weeks.  Daneen Schick, BSW, CDP Social Worker, Certified Dementia Practitioner [place] / [place] [facility] [phone]  Total time spent performing care coordination and/or care management activities with the patient by phone or face to face = 5 minutes.

## 2019-05-21 DIAGNOSIS — G8929 Other chronic pain: Secondary | ICD-10-CM | POA: Diagnosis not present

## 2019-05-21 DIAGNOSIS — E039 Hypothyroidism, unspecified: Secondary | ICD-10-CM | POA: Diagnosis not present

## 2019-05-21 DIAGNOSIS — I10 Essential (primary) hypertension: Secondary | ICD-10-CM | POA: Diagnosis not present

## 2019-05-21 DIAGNOSIS — I951 Orthostatic hypotension: Secondary | ICD-10-CM | POA: Diagnosis not present

## 2019-05-21 DIAGNOSIS — D472 Monoclonal gammopathy: Secondary | ICD-10-CM | POA: Diagnosis not present

## 2019-05-21 DIAGNOSIS — R51 Headache: Secondary | ICD-10-CM | POA: Diagnosis not present

## 2019-05-21 DIAGNOSIS — R413 Other amnesia: Secondary | ICD-10-CM | POA: Diagnosis not present

## 2019-05-22 ENCOUNTER — Telehealth: Payer: Self-pay

## 2019-05-24 ENCOUNTER — Telehealth: Payer: Self-pay

## 2019-05-24 ENCOUNTER — Other Ambulatory Visit: Payer: Self-pay | Admitting: Internal Medicine

## 2019-05-24 ENCOUNTER — Ambulatory Visit: Payer: Self-pay

## 2019-05-24 DIAGNOSIS — R413 Other amnesia: Secondary | ICD-10-CM

## 2019-05-24 NOTE — Chronic Care Management (AMB) (Signed)
  Chronic Care Management   Social Work Follow Up Note  05/24/2019 Name: Brianna [name] MRN: [id number] DOB: 12/01/1932  CCM SW placed a third unsuccessful outbound call to the patient in an attempt to assist with patient stated goals. Unfortunately, the patient does not have a voice mailbox established. CCM SW has marked current SW goal "complete" and will sign off at this time. CCM SW is happy to work with the patient in the future.   Goals Addressed            This Visit's Progress     Patient Stated   . COMPLETED: "my memory is changing" (pt-stated) Unable to assess goal progression due to inability to maintain patient contact       Current Barriers:  . Social Isolation . Memory Deficits . Lacks knowledge of signs and symptoms of dementia. Patient reports beginning to become lost when driving home.  "Yesterday I got lost on my way home. I had to pull over and think for a few minutes about where I was going"  Clinical Social Work Clinical Goal(s):  [name] Over the next 30 days, patient will follow up with neurology  to address needs related to cognitive decline Not met re-established as a goal on 5/14 Goal not met. . 03/27/19- Over the next 30 days patient will be seen by a neurologist at [facility] in response to cognitive changes  CCM SW Interventions: Completed 04/16/19 . Outbound call to the patient to remind of [place] neurology appointment scheduled for July 1 . Confirmed the patient is able to report appointment information to be written on her calendar . Encouraged the patient to have her son attend the appointment with her . Scheduled follow up call to the patient within the next three weeks to review exam findings and discuss any interventions suggested by neurologist  CCM RN CM Interventions:  Completed call with patient on 03/27/19:   . Assessed for start of [facility] services; pt confirms having a HH SN visit for medication management  . Reviewed  referral for [facility] services entered by PCP; noted ST is not listed . Informed patient RNCM will ask [name] Cancer to provide an order for ST services to provide help with impaired memory - pt verbalizes understanding and is agreeable  . Sent message to [name] Cancer requesting ST be added to patient's [facility] services for brain mnemonics - [name] Cancer will send the order   Patient Self Care Activities:  . The patient attends provider appointments when needed . The patient calls provider office with new concerns . The patient utilizes a calendar to keep track of important appointments  Please see past updates related to this goal by clicking on the "Past Updates" button in the selected goal          Follow Up Plan: No further CCM SW follow up planned at this time. CCM SW has collaborated with CCM RN Case Freight forwarder via in basket message to inform of SW unsuccessful attempts to engage the patient.  Daneen Schick, BSW, CDP Social Worker, Certified Dementia Practitioner [place] [facility] [place] [facility] [phone]

## 2019-05-27 ENCOUNTER — Telehealth: Payer: Self-pay

## 2019-05-29 ENCOUNTER — Telehealth: Payer: Self-pay | Admitting: Neurology

## 2019-05-29 NOTE — Telephone Encounter (Signed)
[facility] has called to inform RN Romelle Starcher that they are able to go to pt on 08-18, if RN needs to call their # is [phone]

## 2019-05-29 NOTE — Telephone Encounter (Signed)
I returned Brianna [name]'s call at [place] and advised that pt's primary care [name] Cancer had referred pt to home health, not Dr. Jaynee Eagles and this was not addressed at the office visit with Dr. Jaynee Eagles. I requested she defer her call to Dr. Lynder Parents office. She verbalized understanding and appreciation for the call.

## 2019-05-30 ENCOUNTER — Telehealth: Payer: Medicare HMO

## 2019-06-06 DIAGNOSIS — G3184 Mild cognitive impairment, so stated: Secondary | ICD-10-CM | POA: Diagnosis not present

## 2019-06-06 DIAGNOSIS — D472 Monoclonal gammopathy: Secondary | ICD-10-CM | POA: Diagnosis not present

## 2019-06-06 DIAGNOSIS — E039 Hypothyroidism, unspecified: Secondary | ICD-10-CM | POA: Diagnosis not present

## 2019-06-06 DIAGNOSIS — I10 Essential (primary) hypertension: Secondary | ICD-10-CM | POA: Diagnosis not present

## 2019-06-07 ENCOUNTER — Other Ambulatory Visit: Payer: Self-pay

## 2019-06-07 ENCOUNTER — Telehealth: Payer: Self-pay

## 2019-06-07 ENCOUNTER — Ambulatory Visit (INDEPENDENT_AMBULATORY_CARE_PROVIDER_SITE_OTHER): Payer: Medicare HMO

## 2019-06-07 DIAGNOSIS — R413 Other amnesia: Secondary | ICD-10-CM

## 2019-06-07 DIAGNOSIS — I10 Essential (primary) hypertension: Secondary | ICD-10-CM | POA: Diagnosis not present

## 2019-06-07 DIAGNOSIS — E039 Hypothyroidism, unspecified: Secondary | ICD-10-CM | POA: Diagnosis not present

## 2019-06-10 NOTE — Patient Instructions (Signed)
Visit Information  Goals Addressed      Patient Stated   . "I get confused on how to take my thyroid medication" (pt-stated)       Current Barriers:  [name] Knowledge Deficit related to Hypothyroidism  . Impaired memory  Nurse Case Manager Clinical Goal(s):  [name] Over the next 90 days, patients thyroid levels will be within normal range. Goal Not Met . 03/29/19: Goal reestablished-Over the next 30 days, patient will report taking her Synthroid exactly as prescribed w/o missed or delayed doses.  [name] 03/29/19: Over the next 90 days, patient's thyroid level will be within normal range.    CCM RN CM Interventions:  06/07/19 call completed with patient   . Assessed for adherence to patient following her medication regimen for taking Synthroid as directed by [name] Cancer  (pt states she is adhering and is using her pill box to help her remember to take meds, she is taking thyroid medication in the am 1-2 hours before breakfast)  Reviewed and discussed most recent med regimen recommended by [name] Cancer; patient reports taking Synthroid Monday through Saturday none on Sunday as directed - patient uses a pill box and has established a daily routine   Discussed patient completed her SNV for medication management - reviewed pill package options with patient, she feels very comfortable with her current process and is using a pill box, pt declines wanting to change her established routine at this time  Discussed patient is making an effort to drink more water; states she keeps a bottle of water with her at all times to help her remember to drink  Assessed for hydration - patient continues to make an effort to increase her fluid intake and stay well hydrated  . Discussed plans with patient for ongoing care management follow up and provided patient with direct contact information for care management team  Patient Self Care Activities:   Verbalizes understanding of the education/information provided  today  Self administers medications as prescribed . Drives self and attends all scheduled provider appointments . Calls pharmacy for medication refills . Performs ADL's independently . Calls provider office for new concerns or questions   Please see past updates related to this goal by clicking on the "Past Updates" button in the selected goal       . "I saw the Neurologist about my memory loss" (pt-stated)   On track    Current Barriers:  [name] Knowledge Deficits related to diagnosis and treatment of MCI . Cognitive Deficits . Newly diagnosed MCI (Mild Cognitive Impairment)  Nurse Case Manager Clinical Goal(s):  [name] Over the next 30 days, patient will demonstrate improved health management independence as evidenced bypatient will demonstrate adhering to interventions that may slow the progression of memory loss.  . Over the next 60 days, patient will verbalize performing interventions to help improve her quality of life and independence secondary to having memory loss.  CCM RN CM Interventions:  06/07/19 call completed with patient   . Evaluation of current treatment plan related to MCI (mild cognitive impairement) and patient's adherence to plan as established by provider. . Provided education to patient re: interventions to help achieve independence with use of keeping a calendar in a designated area in her home to record appointments, and other importance dates and or information that she may easily forget, placing a basket near her front and or rear entrance to her home to place her checkbook, wallet, keys, etc, discussed setting a timer when cooking and to  set alarms to help remind her when to take her medications and or call in refills when supply is getting low; discussed limiting her driving and to drive only during the daytime and to include letting her son know when she is leaving the house and where she is planning to drive/visit . Reviewed medications with patient and discussed  pharmacological treatment for memory loss as discussed with Neurologist and patient declines starting a medication for this condition at this time . Discussed plans with patient for ongoing care management follow up and provided patient with direct contact information for care management team . Provided patient with printed educational materials related to "Healthy Aging" tips for patients with memory loss  . Reviewed scheduled/upcoming provider appointments including: patients next scheduled OV for her Medicare Wellness visit with [name] Cancer set for 07/25/19 _0    06/10/19 Internal collaboration with [place]  . Received notification from [facility] advising patient has a new order for services through [facility], noted ST services were ordered for brain mneumonics and not yet completed . [facility] [phone], spoke with Anderson Malta, confirmed patient completed her [facility] visit for ST, brain mneumonics on 06/07/19 with ST Hershal Coria, orders for visit frequency are pending   Patient Self Care Activities:  . Self administers medications as prescribed . Attends all scheduled provider appointments . Calls pharmacy for medication refills . Performs ADL's independently . Performs IADL's independently . Calls provider office for new concerns or questions  Initial goal documentation        The patient verbalized understanding of instructions provided today and declined a print copy of patient instruction materials.   Telephone follow up appointment with care management team member scheduled for: 06/19/19  Barb Merino, RN, BSN, CCM Care Management Coordinator [facility]  Direct Phone: [phone]

## 2019-06-10 NOTE — Chronic Care Management (AMB) (Signed)
Chronic Care Management   Follow Up Note   06/07/2019 Name: Brianna [name] MRN: [id number] DOB: 31-Mar-1933  Referred by: [name], MD Reason for referral : Chronic Care Management (CCM RNCM Telephone Follow up )   Brianna [name] is a 85 y.o. year old female who is a primary care patient of [name], MD. The CCM team was consulted for assistance with chronic disease management and care coordination needs.    Review of patient status, including review of consultants reports, relevant laboratory and other test results, and collaboration with appropriate care team members and the patient's provider was performed as part of comprehensive patient evaluation and provision of chronic care management services.    SDOH (Social Determinants of Health) screening performed today: None. See Care Plan for related entries.   I spoke with Brianna [name] by telephone today to follow up on her [facility] services and MD follow up with Neurology.   Outpatient Encounter Medications as of 06/07/2019  Medication Sig Note  . amLODipine (NORVASC) 5 MG tablet TAKE 1 TABLET BY MOUTH EVERY DAY   . aspirin 81 MG tablet Take 81 mg by mouth daily.   . Cholecalciferol (VITAMIN D3) 125 MCG (5000 UT) CAPS Take 5,000 Units by mouth daily.    . fish oil-omega-3 fatty acids 1000 MG capsule Take 1 g by mouth daily.    . furosemide (LASIX) 40 MG tablet Take 40 mg by mouth daily as needed for fluid or edema.   . meclizine (ANTIVERT) 12.5 MG tablet Take 1 tablet (12.5 mg total) by mouth 3 (three) times daily as needed for dizziness. (Patient not taking: Reported on 02/25/2019) 04/17/2019: Never filled  . Multiple Vitamin (MULTIVITAMIN WITH MINERALS) TABS Take 1 tablet by mouth daily.    [name] senna (SENOKOT) 8.6 MG TABS tablet Take 1 tablet by mouth daily as needed for mild constipation.   [name] SYNTHROID 125 MCG tablet Monday - Saturday    No facility-administered encounter medications on file as of 06/07/2019.      Goals  Addressed      Patient Stated   . "I get confused on how to take my thyroid medication" (pt-stated)       Current Barriers:  [name] Knowledge Deficit related to Hypothyroidism  . Impaired memory  Nurse Case Manager Clinical Goal(s):  [name] Over the next 90 days, patients thyroid levels will be within normal range. Goal Not Met . 03/29/19: Goal reestablished-Over the next 30 days, patient will report taking her Synthroid exactly as prescribed w/o missed or delayed doses.  [name] 03/29/19: Over the next 90 days, patient's thyroid level will be within normal range.    CCM RN CM Interventions:  06/07/19 call completed with patient   . Assessed for adherence to patient following her medication regimen for taking Synthroid as directed by [name] Cancer  (pt states she is adhering and is using her pill box to help her remember to take meds, she is taking thyroid medication in the am 1-2 hours before breakfast)  Reviewed and discussed most recent med regimen recommended by [name] Cancer; patient reports taking Synthroid Monday through Saturday none on Sunday as directed - patient uses a pill box and has established a daily routine   Discussed patient completed her SNV for medication management - reviewed pill package options with patient, she feels very comfortable with her current process and is using a pill box, pt declines wanting to change her established routine at this time  Discussed patient is  making an effort to drink more water; states she keeps a bottle of water with her at all times to help her remember to drink  Assessed for hydration - patient continues to make an effort to increase her fluid intake and stay well hydrated  . Discussed plans with patient for ongoing care management follow up and provided patient with direct contact information for care management team  Patient Self Care Activities:   Verbalizes understanding of the education/information provided today  Self administers medications  as prescribed . Drives self and attends all scheduled provider appointments . Calls pharmacy for medication refills . Performs ADL's independently . Calls provider office for new concerns or questions   Please see past updates related to this goal by clicking on the "Past Updates" button in the selected goal       . "I saw the Neurologist about my memory loss" (pt-stated)   On track    Current Barriers:  [name] Knowledge Deficits related to diagnosis and treatment of MCI . Cognitive Deficits . Newly diagnosed MCI (Mild Cognitive Impairment)  Nurse Case Manager Clinical Goal(s):  [name] Over the next 30 days, patient will demonstrate improved health management independence as evidenced bypatient will demonstrate adhering to interventions that may slow the progression of memory loss.  . Over the next 60 days, patient will verbalize performing interventions to help improve her quality of life and independence secondary to having memory loss.  CCM RN CM Interventions:  06/07/19 call completed with patient   . Evaluation of current treatment plan related to MCI (mild cognitive impairement) and patient's adherence to plan as established by provider. . Provided education to patient re: interventions to help achieve independence with use of keeping a calendar in a designated area in her home to record appointments, and other importance dates and or information that she may easily forget, placing a basket near her front and or rear entrance to her home to place her checkbook, wallet, keys, etc, discussed setting a timer when cooking and to set alarms to help remind her when to take her medications and or call in refills when supply is getting low; discussed limiting her driving and to drive only during the daytime and to include letting her son know when she is leaving the house and where she is planning to drive/visit . Reviewed medications with patient and discussed pharmacological treatment for memory loss  as discussed with Neurologist and patient declines starting a medication for this condition at this time . Discussed plans with patient for ongoing care management follow up and provided patient with direct contact information for care management team . Provided patient with printed educational materials related to "Healthy Aging" tips for patients with memory loss  . Reviewed scheduled/upcoming provider appointments including: patients next scheduled OV for her Medicare Wellness visit with [name] Cancer set for 07/25/19 _0    06/10/19 Internal collaboration with [place]  . Received notification from [facility] advising patient has a new order for services through [facility], noted ST services were ordered for brain mneumonics and not yet completed . [facility] [phone], spoke with Anderson Malta, confirmed patient completed her [facility] visit for ST, brain mneumonics on 06/07/19 with ST Hershal Coria, orders for visit frequency are pending   Patient Self Care Activities:  . Self administers medications as prescribed . Attends all scheduled provider appointments . Calls pharmacy for medication refills . Performs ADL's independently . Performs IADL's independently . Calls  provider office for new concerns or questions  Initial goal documentation         Telephone follow up appointment with care management team member scheduled for: 06/19/19   Barb Merino, RN, BSN, CCM Care Management Coordinator [place] [facility]  Direct Phone: [phone]

## 2019-06-13 DIAGNOSIS — D472 Monoclonal gammopathy: Secondary | ICD-10-CM | POA: Diagnosis not present

## 2019-06-13 DIAGNOSIS — E039 Hypothyroidism, unspecified: Secondary | ICD-10-CM | POA: Diagnosis not present

## 2019-06-13 DIAGNOSIS — G3184 Mild cognitive impairment, so stated: Secondary | ICD-10-CM | POA: Diagnosis not present

## 2019-06-13 DIAGNOSIS — I10 Essential (primary) hypertension: Secondary | ICD-10-CM | POA: Diagnosis not present

## 2019-06-19 ENCOUNTER — Telehealth: Payer: Self-pay

## 2019-06-20 DIAGNOSIS — E039 Hypothyroidism, unspecified: Secondary | ICD-10-CM | POA: Diagnosis not present

## 2019-06-20 DIAGNOSIS — D472 Monoclonal gammopathy: Secondary | ICD-10-CM | POA: Diagnosis not present

## 2019-06-20 DIAGNOSIS — G3184 Mild cognitive impairment, so stated: Secondary | ICD-10-CM | POA: Diagnosis not present

## 2019-06-20 DIAGNOSIS — I10 Essential (primary) hypertension: Secondary | ICD-10-CM | POA: Diagnosis not present

## 2019-06-27 ENCOUNTER — Other Ambulatory Visit: Payer: Self-pay | Admitting: Internal Medicine

## 2019-06-27 DIAGNOSIS — I10 Essential (primary) hypertension: Secondary | ICD-10-CM | POA: Diagnosis not present

## 2019-06-27 DIAGNOSIS — D472 Monoclonal gammopathy: Secondary | ICD-10-CM | POA: Diagnosis not present

## 2019-06-27 DIAGNOSIS — G3184 Mild cognitive impairment, so stated: Secondary | ICD-10-CM | POA: Diagnosis not present

## 2019-06-27 DIAGNOSIS — E039 Hypothyroidism, unspecified: Secondary | ICD-10-CM | POA: Diagnosis not present

## 2019-06-28 ENCOUNTER — Telehealth: Payer: Self-pay

## 2019-07-02 ENCOUNTER — Ambulatory Visit: Payer: Medicare HMO | Admitting: Internal Medicine

## 2019-07-10 ENCOUNTER — Other Ambulatory Visit: Payer: Self-pay

## 2019-07-10 ENCOUNTER — Encounter: Payer: Self-pay | Admitting: Nurse Practitioner

## 2019-07-10 ENCOUNTER — Ambulatory Visit: Payer: Medicare HMO | Admitting: Nurse Practitioner

## 2019-07-10 ENCOUNTER — Ambulatory Visit (INDEPENDENT_AMBULATORY_CARE_PROVIDER_SITE_OTHER): Payer: Medicare HMO | Admitting: Nurse Practitioner

## 2019-07-10 VITALS — BP 142/70 | HR 68 | Temp 98.6°F | Ht 63.8 in | Wt 150.0 lb

## 2019-07-10 DIAGNOSIS — M25471 Effusion, right ankle: Secondary | ICD-10-CM

## 2019-07-10 DIAGNOSIS — I10 Essential (primary) hypertension: Secondary | ICD-10-CM | POA: Diagnosis not present

## 2019-07-10 DIAGNOSIS — R21 Rash and other nonspecific skin eruption: Secondary | ICD-10-CM | POA: Diagnosis not present

## 2019-07-10 DIAGNOSIS — E039 Hypothyroidism, unspecified: Secondary | ICD-10-CM

## 2019-07-10 NOTE — Patient Instructions (Signed)
Wear support socks to help with the swelling  Elevate your feet when sitting in your chair.

## 2019-07-10 NOTE — Progress Notes (Signed)
Subjective:     Patient ID: Brianna [name] , female    DOB: July 30, 1933 , 85 y.o.   MRN: [id number]   Chief Complaint  Patient presents with  . Rash    patinet stated around her neck and her arms are turning grey. she stated it itches sometimes so she put on vaseline on it and it helps with the itching. patient stated she has not put any lotion on within the last couple of days so you could see how her skin is. she stated her skin was not like that a couple days ago  . Edema    HPI  Edema to right lower extremity more than left.  She reports her legs have been swelling over the last 3 days.  She has not been elevating her legs.  She denies eating an increase in salt. She does admit to not drinking adequate amounts of water.  Rash This is a chronic problem. The current episode started more than 1 year ago. The problem has been gradually worsening since onset. Location: bilateral arms. The rash is characterized by itchiness and dryness. She was exposed to nothing. Pertinent negatives include no fatigue, fever or shortness of breath. Treatments tried: vaseline. The treatment provided no relief.     Past Medical History:  Diagnosis Date  . Headache 12/24/2013  . Headache(784.0)   . Hypertension    pcp  preston clark  . Hypothyroid   . MGUS (monoclonal gammopathy of unknown significance) 12/10/2013     Family History  Problem Relation Age of Onset  . Cancer Maternal Grandfather        stomach cancer  . Cancer Paternal Grandfather        stomach cancer  . Healthy Mother   . Dementia Mother        at 9-99 y.o  . Healthy Father      Current Outpatient Medications:  .  amLODipine (NORVASC) 5 MG tablet, TAKE 1 TABLET BY MOUTH EVERY DAY, Disp: 90 tablet, Rfl: 1 .  aspirin 81 MG tablet, Take 81 mg by mouth daily., Disp: , Rfl:  .  Cholecalciferol (VITAMIN D3) 125 MCG (5000 UT) CAPS, Take 5,000 Units by mouth daily. , Disp: , Rfl:  .  furosemide (LASIX) 40 MG tablet, Take 40 mg by  mouth daily as needed for fluid or edema., Disp: , Rfl:  .  Multiple Vitamin (MULTIVITAMIN WITH MINERALS) TABS, Take 1 tablet by mouth daily. , Disp: , Rfl:  .  senna (SENOKOT) 8.6 MG TABS tablet, Take 1 tablet by mouth daily as needed for mild constipation., Disp: , Rfl:  .  SYNTHROID 125 MCG tablet, Monday - Saturday, Disp: 90 tablet, Rfl: 90 .  fish oil-omega-3 fatty acids 1000 MG capsule, Take 1 g by mouth daily. , Disp: , Rfl:    Allergies  Allergen Reactions  . Iodine Other (See Comments)    Reaction unknown  . Sulfa Antibiotics Other (See Comments)    Childhood reaction     Review of Systems  Constitutional: Negative.  Negative for fatigue and fever.  Respiratory: Negative.  Negative for shortness of breath.   Cardiovascular: Negative.  Negative for palpitations and leg swelling.  Skin: Positive for rash (bilateral arms ).  Neurological: Negative for dizziness and headaches.     Today's Vitals   07/10/19 1118  BP: (!) 142/70  Pulse: 68  Temp: 98.6 F (37 C)  TempSrc: Oral  Weight: 150 lb (68 kg)  Height: 5' 3.8" (  1.621 m)  PainSc: 0-No pain   Body mass index is 25.91 kg/m.   Objective:  Physical Exam Constitutional:      Appearance: Normal appearance.  Cardiovascular:     Rate and Rhythm: Normal rate and regular rhythm.     Pulses: Normal pulses.     Heart sounds: Normal heart sounds. No murmur.  Pulmonary:     Effort: Pulmonary effort is normal.     Breath sounds: Normal breath sounds.  Musculoskeletal:     Right lower leg: Edema (1+) present.     Left lower leg: Edema (trace) present.  Skin:    General: Skin is warm and dry.     Capillary Refill: Capillary refill takes less than 2 seconds.  Neurological:     General: No focal deficit present.     Mental Status: She is alert and oriented to person, place, and time.         Assessment And Plan:     1. Essential hypertension Blood pressure is slightly elevated but not concerning with her age -  CMP14 + Anion Gap  2. Primary hypothyroidism Will check thyroid levels today She is taking her synthroid M-F - TSH - T3 - T4, Free  3. Right ankle swelling She has 1+ swelling to her right ankle, nonpitting edema She is to elevate her lower extremities and wear support socks  4. Rash and nonspecific skin eruption Dry, scaly rash to bilateral arms I will check autoimmune panel as she is scratching a lot during the visit - Autoimmune Profile - CRP (C-Reactive Protein)   Minette Brine, FNP    THE PATIENT IS ENCOURAGED TO PRACTICE SOCIAL DISTANCING DUE TO THE COVID-19 PANDEMIC.

## 2019-07-11 ENCOUNTER — Telehealth: Payer: Self-pay

## 2019-07-11 LAB — TSH: TSH: 0.502 u[IU]/mL (ref 0.450–4.500)

## 2019-07-12 LAB — CMP14 + ANION GAP
ALT: 16 IU/L (ref 0–32)
AST: 25 IU/L (ref 0–40)
Albumin/Globulin Ratio: 1.1 — ABNORMAL LOW (ref 1.2–2.2)
Albumin: 4.1 g/dL (ref 3.6–4.6)
Alkaline Phosphatase: 67 IU/L (ref 39–117)
Anion Gap: 20 mmol/L — ABNORMAL HIGH (ref 10.0–18.0)
BUN/Creatinine Ratio: 17 (ref 12–28)
BUN: 16 mg/dL (ref 8–27)
Bilirubin Total: 0.2 mg/dL (ref 0.0–1.2)
CO2: 21 mmol/L (ref 20–29)
Calcium: 9 mg/dL (ref 8.7–10.3)
Chloride: 98 mmol/L (ref 96–106)
Creatinine, Ser: 0.96 mg/dL (ref 0.57–1.00)
GFR calc Af Amer: 63 mL/min/{1.73_m2} (ref >59)
GFR calc non Af Amer: 54 mL/min/{1.73_m2} — ABNORMAL LOW (ref >59)
Globulin, Total: 3.6 g/dL (ref 1.5–4.5)
Glucose: 83 mg/dL (ref 65–99)
Potassium: 4.2 mmol/L (ref 3.5–5.2)
Sodium: 139 mmol/L (ref 134–144)
Total Protein: 7.7 g/dL (ref 6.0–8.5)

## 2019-07-12 LAB — AUTOIMMUNE PROFILE
Anti Nuclear Antibody (ANA): NEGATIVE
Complement C3, Serum: 121 mg/dL (ref 82–167)
dsDNA Ab: <1 IU/mL (ref 0–9)

## 2019-07-12 LAB — T3: T3, Total: 96 ng/dL (ref 71–180)

## 2019-07-12 LAB — T4, FREE: Free T4: 1.86 ng/dL — ABNORMAL HIGH (ref 0.82–1.77)

## 2019-07-12 LAB — C-REACTIVE PROTEIN: CRP: 6 mg/L (ref 0–10)

## 2019-07-16 ENCOUNTER — Other Ambulatory Visit: Payer: Self-pay

## 2019-07-16 ENCOUNTER — Ambulatory Visit (INDEPENDENT_AMBULATORY_CARE_PROVIDER_SITE_OTHER): Payer: Medicare HMO | Admitting: Nurse Practitioner

## 2019-07-16 ENCOUNTER — Encounter: Payer: Self-pay | Admitting: Nurse Practitioner

## 2019-07-16 VITALS — BP 122/70 | HR 67 | Temp 98.0°F | Ht 62.8 in | Wt 153.0 lb

## 2019-07-16 DIAGNOSIS — R600 Localized edema: Secondary | ICD-10-CM

## 2019-07-16 DIAGNOSIS — L03115 Cellulitis of right lower limb: Secondary | ICD-10-CM

## 2019-07-16 DIAGNOSIS — L02415 Cutaneous abscess of right lower limb: Secondary | ICD-10-CM

## 2019-07-16 DIAGNOSIS — R21 Rash and other nonspecific skin eruption: Secondary | ICD-10-CM | POA: Diagnosis not present

## 2019-07-16 MED ORDER — CEFTRIAXONE SODIUM 500 MG IJ SOLR
500.0000 mg | Freq: Once | INTRAMUSCULAR | Status: AC
  Administered 2019-07-16: 500 mg via INTRAMUSCULAR

## 2019-07-16 MED ORDER — DOXYCYCLINE HYCLATE 100 MG PO CAPS: 100.0000 mg | ORAL_CAPSULE | Freq: Two times a day (BID) | ORAL | 0 refills | Status: DC

## 2019-07-16 NOTE — Progress Notes (Signed)
Subjective:     Patient ID: Brianna [name] , female    DOB: 1933/05/21 , 85 y.o.   MRN: [id number]   Chief Complaint  Patient presents with  . Edema    patient stated her legs were really swollen.    HPI  Swelling to both feet. She also had "itching" to her right lower extremity and opened the area.  She has not been using the support socks as I had advised her last week. She has also not been elevated her lower extremities,    Past Medical History:  Diagnosis Date  . Headache 12/24/2013  . Headache(784.0)   . Hypertension    pcp  preston clark  . Hypothyroid   . MGUS (monoclonal gammopathy of unknown significance) 12/10/2013     Family History  Problem Relation Age of Onset  . Cancer Maternal Grandfather        stomach cancer  . Cancer Paternal Grandfather        stomach cancer  . Healthy Mother   . Dementia Mother        at 85-99 y.o  . Healthy Father      Current Outpatient Medications:  .  amLODipine (NORVASC) 5 MG tablet, TAKE 1 TABLET BY MOUTH EVERY DAY, Disp: 90 tablet, Rfl: 1 .  aspirin 81 MG tablet, Take 81 mg by mouth daily., Disp: , Rfl:  .  Cholecalciferol (VITAMIN D3) 125 MCG (5000 UT) CAPS, Take 5,000 Units by mouth daily. , Disp: , Rfl:  .  fish oil-omega-3 fatty acids 1000 MG capsule, Take 1 g by mouth daily. , Disp: , Rfl:  .  furosemide (LASIX) 40 MG tablet, Take 40 mg by mouth daily as needed for fluid or edema., Disp: , Rfl:  .  Multiple Vitamin (MULTIVITAMIN WITH MINERALS) TABS, Take 1 tablet by mouth daily. , Disp: , Rfl:  .  senna (SENOKOT) 8.6 MG TABS tablet, Take 1 tablet by mouth daily as needed for mild constipation., Disp: , Rfl:  .  SYNTHROID 125 MCG tablet, Monday - Saturday, Disp: 90 tablet, Rfl: 90   Allergies  Allergen Reactions  . Iodine Other (See Comments)    Reaction unknown  . Sulfa Antibiotics Other (See Comments)    Childhood reaction     Review of Systems  Constitutional: Negative.   Respiratory: Negative.    Cardiovascular: Negative.  Negative for chest pain, palpitations and leg swelling.  Skin: Positive for rash (lower extremities with dry skin) and wound (right lower extremity has reddened area and drainage).  Neurological: Negative for dizziness and headaches.  Psychiatric/Behavioral: Negative.      Today's Vitals   07/16/19 1626  BP: 122/70  Pulse: 67  Temp: 98 F (36.7 C)  TempSrc: Oral  Weight: 153 lb (69.4 kg)  Height: 5' 2.8" (1.595 m)  PainSc: 7   PainLoc: Foot   Body mass index is 27.28 kg/m.   Objective:  Physical Exam Constitutional:      Appearance: Normal appearance.  Cardiovascular:     Rate and Rhythm: Normal rate and regular rhythm.     Pulses: Normal pulses.     Heart sounds: Normal heart sounds. No murmur.  Pulmonary:     Effort: Pulmonary effort is normal. No respiratory distress.     Breath sounds: Normal breath sounds.  Skin:    Capillary Refill: Capillary refill takes less than 2 seconds.     Findings: Erythema (bilateral lower extremities with reddness and swelling, maceration to right lower  extremity) and rash (dry scaly rash to bilateral arms and lower extremities) present.  Neurological:     General: No focal deficit present.     Mental Status: She is alert and oriented to person, place, and time.  Psychiatric:        Mood and Affect: Mood normal.        Behavior: Behavior normal.        Thought Content: Thought content normal.        Judgment: Judgment normal.     Comments: I do have to repeat myself multiple times.         Assessment And Plan:     1. Cellulitis and abscess of right lower extremity  2. Edema of both lower extremities  Encouraged to elevate her feet when possible  She has furosemide at home she feels but has not been taking as needed for her swelling.  She is to call me if she does not have the medication at home  3. Cellulitis of right leg  Erythematous and macerated area   Treated with cetriazone 500 mg and  oral doxycycline  I have explained in detail not to scratch the area - cefTRIAXone (ROCEPHIN) injection 500 mg - Ambulatory referral to Dermatology - doxycycline (VIBRAMYCIN) 100 MG capsule; Take 1 capsule (100 mg total) by mouth 2 (two) times daily.  Dispense: 14 capsule; Refill: 0  3. Rash and nonspecific skin eruption  She has dry scaly skin to bilateral arms almost leathery  Also has really dry skin to her bilateral lower extremities   Minette Brine, FNP    THE PATIENT IS ENCOURAGED TO PRACTICE SOCIAL DISTANCING DUE TO THE COVID-19 PANDEMIC.

## 2019-07-25 ENCOUNTER — Ambulatory Visit: Payer: Self-pay | Admitting: Internal Medicine

## 2019-07-25 ENCOUNTER — Ambulatory Visit: Payer: Self-pay

## 2019-07-26 ENCOUNTER — Telehealth: Payer: Self-pay

## 2019-07-29 ENCOUNTER — Ambulatory Visit: Payer: Self-pay

## 2019-07-29 ENCOUNTER — Telehealth: Payer: Self-pay

## 2019-07-29 DIAGNOSIS — L02415 Cutaneous abscess of right lower limb: Secondary | ICD-10-CM

## 2019-07-29 DIAGNOSIS — L03115 Cellulitis of right lower limb: Secondary | ICD-10-CM

## 2019-07-29 DIAGNOSIS — R413 Other amnesia: Secondary | ICD-10-CM

## 2019-07-29 NOTE — Chronic Care Management (AMB) (Signed)
  Chronic Care Management   Social Work Note  07/29/2019 Name: Brianna [name] MRN: [id number] DOB: 02-08-1933  SW placed an unsuccessful outbound call to the patient to conduct a quarterly SDOH screen and assess for new case management needs. Unfortunately, today's call was unsuccessful with no option to leave a voice message.   Upon chart review it is noted patient had recent flare up with cellulitis and referral to dermatology. SW to collaborate with CM team who will follow up with the patient.  Follow Up Plan: The CM team will follow up with the patient over the next 4 weeks.  Daneen Schick, BSW, CDP Social Worker, Certified Dementia Practitioner [place] [facility] [phone]

## 2019-07-30 DIAGNOSIS — H04123 Dry eye syndrome of bilateral lacrimal glands: Secondary | ICD-10-CM | POA: Diagnosis not present

## 2019-07-30 DIAGNOSIS — H43812 Vitreous degeneration, left eye: Secondary | ICD-10-CM | POA: Diagnosis not present

## 2019-07-30 DIAGNOSIS — H40012 Open angle with borderline findings, low risk, left eye: Secondary | ICD-10-CM | POA: Diagnosis not present

## 2019-07-31 ENCOUNTER — Ambulatory Visit (INDEPENDENT_AMBULATORY_CARE_PROVIDER_SITE_OTHER): Payer: Medicare HMO

## 2019-07-31 DIAGNOSIS — E039 Hypothyroidism, unspecified: Secondary | ICD-10-CM

## 2019-07-31 DIAGNOSIS — R413 Other amnesia: Secondary | ICD-10-CM

## 2019-07-31 DIAGNOSIS — I10 Essential (primary) hypertension: Secondary | ICD-10-CM

## 2019-08-02 NOTE — Patient Instructions (Signed)
Visit Information  Goals Addressed      Patient Stated   . "I get confused on how to take my thyroid medication" (pt-stated)       Current Barriers:  [name] Knowledge Deficit related to Hypothyroidism  . Impaired memory  Nurse Case Manager Clinical Goal(s):  [name] Over the next 90 days, patients thyroid levels will be within normal range. Goal Not Met . 03/29/19: Goal reestablished-Over the next 30 days, patient will report taking her Synthroid exactly as prescribed w/o missed or delayed doses.  [name] 03/29/19: Over the next 90 days, patient's thyroid level will be within normal range.    CCM RN CM Interventions:  07/31/19 call completed with patient   . Assessed for adherence to patient following her medication regimen for taking Synthroid as directed by [name] Cancer  (pt states she is adhering and is using her pill box to help her remember to take meds, she is taking thyroid medication in the am 1-2 hours before breakfast) during the call patient realized she has missed taking her synthroid this morning, she is eating breakfast and will wait 2 hours afterwards before taking this dose  Reinforced and discussed most recent med regimen recommended by [name] Cancer; patient reports taking Synthroid Monday through Saturday none on Sunday as directed - patient uses a pill box and has established a daily routine   Discussed patient completed her SNV for medication management - reviewed pill package options with patient, she feels very comfortable with her current process and is using a pill box, pt declines wanting to change her established routine at this time . Discussed plans with patient for ongoing care management follow up and provided patient with direct contact information for care management team  Patient Self Care Activities:   Verbalizes understanding of the education/information provided today  Self administers medications as prescribed . Drives self and attends all scheduled provider  appointments . Calls pharmacy for medication refills . Performs ADL's independently . Calls provider office for new concerns or questions   Please see past updates related to this goal by clicking on the "Past Updates" button in the selected goal       . COMPLETED: "I saw the Neurologist about my memory loss" (pt-stated)       Current Barriers:  [name] Knowledge Deficits related to diagnosis and treatment of MCI . Cognitive Deficits . Newly diagnosed MCI (Mild Cognitive Impairment)  Nurse Case Manager Clinical Goal(s):  Over the next 30 days, patient will demonstrate improved health management independence as evidenced bypatient will demonstrate adhering to interventions that may slow the progression of memory loss.  Goal Met  Over the next 60 days, patient will verbalize performing interventions to help improve her quality of life and independence secondary to having memory loss.Goal Met  CCM RN CM Interventions:  07/31/19 call completed with patient   . Evaluation of current treatment plan related to MCI (mild cognitive impairement) and patient's adherence to plan as established by provider. . Reiterated interventions to help achieve independence with use of keeping a calendar in a designated area in her home to record appointments, and other importance dates and or information that she may easily forget, placing a basket near her front and or rear entrance to her home to place her checkbook, wallet, keys, etc, discussed setting a timer when cooking and to set alarms to help remind her when to take her medications and or call in refills when supply is getting low; discussed limiting her driving  and to drive only during the daytime and to include letting her son know when she is leaving the house and where she is planning to drive/visit . Discussed patient does not currently have a f/u visit with Neuro  . Discussed her sons continue to live with her and are available to assist her as  needed . Discussed patient is currently satisfied with how she is managing her memory loss and reports having no further concerns at this time . Confirmed patient has the contact information for Neurology if needed and will contact the Neurology for worsening s/s or concerns related to memory loss  Patient Self Care Activities:  . Self administers medications as prescribed . Attends all scheduled provider appointments . Calls pharmacy for medication refills . Performs ADL's independently . Performs IADL's independently . Calls provider office for new concerns or questions  Please see past updates related to this goal by clicking on the "Past Updates" button in the selected goal      . "My skin has been dry & itchy" (pt-stated)       Current Barriers:  [name] Knowledge Deficits related to treatment management for impaired skin integrity . Hypothyroidism  Nurse Case Manager Clinical Goal(s):  [name] Over the next 30 days, patient will report her skin is intact and without cellulitis and or open wounds . Over the next 90 days, patient will verbalize having an increased understanding of how to identify early s/s of impaired skin integrity and will verbalize increased understanding about how to properly care for her skin to decrease the risk for having open wounds or other complications that may occur from impaired skin integrity  CCM RN CM Interventions:  07/31/19 call completed with patient   . Discussed patient's recent lower extremity rash and cellulitis has resolved . Discussed patient has completed the prescribed antibiotics and the skin issue to her lower extremities have resolved, she is applying Vaseline as directed . Discussed patient primarily has noticed a change in her skin and describes it to be dry and itchy, reports her arms are worse than her lower extremities . Evaluation of current treatment plan related to impaired skin integrity and patient's adherence to plan as established by  provider. . Advised patient to keep her skin clean and dry and to continue to apply the recommended moisturizer to dry, itchy skin as directed by her PCP  . Provided education to patient re: low thyroid levels may cause impaired skin integrity including dry skin and or coarse skin, decreased sweating and may lead to breaks in the skin . Discussed plans with patient for ongoing care management follow up and provided patient with direct contact information for care management team . Provided patient with printed educational materials related to Hypothyroidism; Tips to Relieve Dry Skin  Patient Self Care Activities:  . Self administers medications as prescribed . Attends all scheduled provider appointments . Calls pharmacy for medication refills . Attends church or other social activities . Performs ADL's independently . Performs IADL's independently . Calls provider office for new concerns or questions   Initial goal documentation        The patient verbalized understanding of instructions provided today and declined a print copy of patient instruction materials.   Telephone follow up appointment with care management team member scheduled for: 08/22/19  Barb Merino, RN, BSN, CCM Care Management Coordinator [facility]  Direct Phone: [phone]

## 2019-08-02 NOTE — Chronic Care Management (AMB) (Signed)
Chronic Care Management   Follow Up Note   07/31/2019 Name: Brianna [name] MRN: [id number] DOB: Feb 17, 1933  Referred by: Brianna Chard, MD Reason for referral : Chronic Care Management (CCM RNCM Telephone Follow up )   Brianna [name] is a 85 y.o. year old female who is a primary care patient of Brianna Chard, MD. The CCM team was consulted for assistance with chronic disease management and care coordination needs.    Review of patient status, including review of consultants reports, relevant laboratory and other test results, and collaboration with appropriate care team members and the patient's provider was performed as part of comprehensive patient evaluation and provision of chronic care management services.    SDOH (Social Determinants of Health) screening performed today: None. See Care Plan for related entries.   Advanced Directives Status: N See Care Plan and Vynca application for related entries.  I spoke with Brianna [name] by telephone today to f/u on her Hypothyroidism and medication compliance.   Outpatient Encounter Medications as of 07/31/2019  Medication Sig  . amLODipine (NORVASC) 5 MG tablet TAKE 1 TABLET BY MOUTH EVERY DAY  . aspirin 81 MG tablet Take 81 mg by mouth daily.  . Cholecalciferol (VITAMIN D3) 125 MCG (5000 UT) CAPS Take 5,000 Units by mouth daily.   [name] doxycycline (VIBRAMYCIN) 100 MG capsule Take 1 capsule (100 mg total) by mouth 2 (two) times daily.  . fish oil-omega-3 fatty acids 1000 MG capsule Take 1 g by mouth daily.   . furosemide (LASIX) 40 MG tablet Take 40 mg by mouth daily as needed for fluid or edema.  . Multiple Vitamin (MULTIVITAMIN WITH MINERALS) TABS Take 1 tablet by mouth daily.   [name] senna (SENOKOT) 8.6 MG TABS tablet Take 1 tablet by mouth daily as needed for mild constipation.  [name] SYNTHROID 125 MCG tablet Monday - Saturday   No facility-administered encounter medications on file as of 07/31/2019.      Goals Addressed      Patient Stated   . "I get confused on how to take my thyroid medication" (pt-stated)       Current Barriers:  [name] Knowledge Deficit related to Hypothyroidism  . Impaired memory  Nurse Case Manager Clinical Goal(s):  [name] Over the next 90 days, patients thyroid levels will be within normal range. Goal Not Met . 03/29/19: Goal reestablished-Over the next 30 days, patient will report taking her Synthroid exactly as prescribed w/o missed or delayed doses.  [name] 03/29/19: Over the next 90 days, patient's thyroid level will be within normal range.    CCM RN CM Interventions:  07/31/19 call completed with patient   . Assessed for adherence to patient following her medication regimen for taking Synthroid as directed by Brianna [name]  (pt states she is adhering and is using her pill box to help her remember to take meds, she is taking thyroid medication in the am 1-2 hours before breakfast) during the call patient realized she has missed taking her synthroid this morning, she is eating breakfast and will wait 2 hours afterwards before taking this dose  Reinforced and discussed most recent med regimen recommended by Brianna [name]; patient reports taking Synthroid Monday through Saturday none on Sunday as directed - patient uses a pill box and has established a daily routine   Discussed patient completed her SNV for medication management - reviewed pill package options with patient, she feels very comfortable with her current process and is using a pill box, pt declines  wanting to change her established routine at this time . Discussed plans with patient for ongoing care management follow up and provided patient with direct contact information for care management team  Patient Self Care Activities:   Verbalizes understanding of the education/information provided today  Self administers medications as prescribed . Drives self and attends all scheduled provider appointments . Calls pharmacy for medication refills  . Performs ADL's independently . Calls provider office for new concerns or questions   Please see past updates related to this goal by clicking on the "Past Updates" button in the selected goal       . COMPLETED: "I saw the Neurologist about my memory loss" (pt-stated)       Current Barriers:  [name] Knowledge Deficits related to diagnosis and treatment of MCI . Cognitive Deficits . Newly diagnosed MCI (Mild Cognitive Impairment)  Nurse Case Manager Clinical Goal(s):  Over the next 30 days, patient will demonstrate improved health management independence as evidenced bypatient will demonstrate adhering to interventions that may slow the progression of memory loss.  Goal Met  Over the next 60 days, patient will verbalize performing interventions to help improve her quality of life and independence secondary to having memory loss.Goal Met  CCM RN CM Interventions:  07/31/19 call completed with patient   . Evaluation of current treatment plan related to MCI (mild cognitive impairement) and patient's adherence to plan as established by provider. . Reiterated interventions to help achieve independence with use of keeping a calendar in a designated area in her home to record appointments, and other importance dates and or information that she may easily forget, placing a basket near her front and or rear entrance to her home to place her checkbook, wallet, keys, etc, discussed setting a timer when cooking and to set alarms to help remind her when to take her medications and or call in refills when supply is getting low; discussed limiting her driving and to drive only during the daytime and to include letting her son know when she is leaving the house and where she is planning to drive/visit . Discussed patient does not currently have a f/u visit with Neuro  . Discussed her sons continue to live with her and are available to assist her as needed . Discussed patient is currently satisfied with how she  is managing her memory loss and reports having no further concerns at this time . Confirmed patient has the contact information for Neurology if needed and will contact the Neurology for worsening s/s or concerns related to memory loss  Patient Self Care Activities:  . Self administers medications as prescribed . Attends all scheduled provider appointments . Calls pharmacy for medication refills . Performs ADL's independently . Performs IADL's independently . Calls provider office for new concerns or questions  Please see past updates related to this goal by clicking on the "Past Updates" button in the selected goal      . "My skin has been dry & itchy" (pt-stated)       Current Barriers:  [name] Knowledge Deficits related to treatment management for impaired skin integrity . Hypothyroidism  Nurse Case Manager Clinical Goal(s):  [name] Over the next 30 days, patient will report her skin is intact and without cellulitis and or open wounds . Over the next 90 days, patient will verbalize having an increased understanding of how to identify early s/s of impaired skin integrity and will verbalize increased understanding about how to properly care for her skin to  decrease the risk for having open wounds or other complications that may occur from impaired skin integrity  CCM RN CM Interventions:  07/31/19 call completed with patient   . Discussed patient's recent lower extremity rash and cellulitis has resolved . Discussed patient has completed the prescribed antibiotics and the skin issue to her lower extremities have resolved, she is applying Vaseline as directed . Discussed patient primarily has noticed a change in her skin and describes it to be dry and itchy, reports her arms are worse than her lower extremities . Evaluation of current treatment plan related to impaired skin integrity and patient's adherence to plan as established by provider. . Advised patient to keep her skin clean and dry and to  continue to apply the recommended moisturizer to dry, itchy skin as directed by her PCP  . Provided education to patient re: low thyroid levels may cause impaired skin integrity including dry skin and or coarse skin, decreased sweating and may lead to breaks in the skin . Discussed plans with patient for ongoing care management follow up and provided patient with direct contact information for care management team . Provided patient with printed educational materials related to Hypothyroidism; Tips to Relieve Dry Skin  Patient Self Care Activities:  . Self administers medications as prescribed . Attends all scheduled provider appointments . Calls pharmacy for medication refills . Attends church or other social activities . Performs ADL's independently . Performs IADL's independently . Calls provider office for new concerns or questions   Initial goal documentation         Telephone follow up appointment with care management team member scheduled for: 08/22/19   Barb Merino, RN, BSN, CCM Care Management Coordinator [place] [facility]  Direct Phone: [phone]

## 2019-08-08 ENCOUNTER — Telehealth: Payer: Self-pay

## 2019-08-13 ENCOUNTER — Other Ambulatory Visit: Payer: Self-pay

## 2019-08-19 ENCOUNTER — Other Ambulatory Visit: Payer: Self-pay

## 2019-08-19 ENCOUNTER — Encounter: Payer: Self-pay | Admitting: Nurse Practitioner

## 2019-08-19 ENCOUNTER — Ambulatory Visit (INDEPENDENT_AMBULATORY_CARE_PROVIDER_SITE_OTHER): Payer: Medicare HMO | Admitting: Nurse Practitioner

## 2019-08-19 ENCOUNTER — Ambulatory Visit: Payer: Self-pay | Admitting: Pharmacist

## 2019-08-19 VITALS — BP 112/80 | HR 81 | Temp 98.1°F | Ht 63.6 in | Wt 149.4 lb

## 2019-08-19 DIAGNOSIS — I10 Essential (primary) hypertension: Secondary | ICD-10-CM

## 2019-08-19 DIAGNOSIS — E039 Hypothyroidism, unspecified: Secondary | ICD-10-CM

## 2019-08-19 DIAGNOSIS — R600 Localized edema: Secondary | ICD-10-CM | POA: Diagnosis not present

## 2019-08-19 DIAGNOSIS — L539 Erythematous condition, unspecified: Secondary | ICD-10-CM

## 2019-08-19 NOTE — Progress Notes (Signed)
Subjective:     Patient ID: Brianna [name] , female    DOB: 1933-07-15 , 85 y.o.   MRN: [id number]   Chief Complaint  Patient presents with  . Edema    patient stated her ankles have been swelling for the past 2 weeks    HPI  She tells me she is back today for her swelling to her lower extremities.  She has been using vaseline to the areas.      Past Medical History:  Diagnosis Date  . Headache 12/24/2013  . Headache(784.0)   . Hypertension    pcp  [facility]  . Hypothyroid   . MGUS (monoclonal gammopathy of unknown significance) 12/10/2013     Family History  Problem Relation Age of Onset  . Cancer Maternal Grandfather        stomach cancer  . Cancer Paternal Grandfather        stomach cancer  . Healthy Mother   . Dementia Mother        at 11-99 y.o  . Healthy Father      Current Outpatient Medications:  .  amLODipine (NORVASC) 5 MG tablet, TAKE 1 TABLET BY MOUTH EVERY DAY, Disp: 90 tablet, Rfl: 1 .  aspirin 81 MG tablet, Take 81 mg by mouth daily., Disp: , Rfl:  .  Cholecalciferol (VITAMIN D3) 125 MCG (5000 UT) CAPS, Take 5,000 Units by mouth daily. , Disp: , Rfl:  .  Multiple Vitamin (MULTIVITAMIN WITH MINERALS) TABS, Take 1 tablet by mouth daily. , Disp: , Rfl:  .  senna (SENOKOT) 8.6 MG TABS tablet, Take 1 tablet by mouth daily as needed for mild constipation., Disp: , Rfl:  .  SYNTHROID 125 MCG tablet, Monday - Saturday, Disp: 90 tablet, Rfl: 90 .  doxycycline (VIBRAMYCIN) 100 MG capsule, Take 1 capsule (100 mg total) by mouth 2 (two) times daily., Disp: 14 capsule, Rfl: 0 .  fish oil-omega-3 fatty acids 1000 MG capsule, Take 1 g by mouth daily. , Disp: , Rfl:  .  furosemide (LASIX) 40 MG tablet, Take 40 mg by mouth daily as needed for fluid or edema. She forgets to take it, Disp: , Rfl:    Allergies  Allergen Reactions  . Iodine Other (See Comments)    Reaction unknown  . Sulfa Antibiotics Other (See Comments)    Childhood reaction     Review of  Systems  Respiratory: Negative.   Cardiovascular: Negative.  Negative for chest pain, palpitations and leg swelling.  Skin: Positive for rash (arms are dry and scaly and bilateral lower extremities have erythematous rash with excoriation around ankles.).  Neurological: Negative for dizziness and headaches.  Psychiatric/Behavioral: Negative.      Today's Vitals   08/19/19 1528  BP: 112/80  Pulse: 81  Temp: 98.1 F (36.7 C)  TempSrc: Oral  Weight: 149 lb 6.4 oz (67.8 kg)  Height: 5' 3.6" (1.615 m)  PainSc: 0-No pain   Body mass index is 25.97 kg/m.   Objective:  Physical Exam Vitals signs reviewed.  Constitutional:      General: She is not in acute distress.    Appearance: Normal appearance.  Cardiovascular:     Rate and Rhythm: Normal rate and regular rhythm.     Pulses: Normal pulses.     Heart sounds: Normal heart sounds. No murmur.  Pulmonary:     Effort: Pulmonary effort is normal. No respiratory distress.     Breath sounds: Normal breath sounds.  Skin:  Capillary Refill: Capillary refill takes less than 2 seconds.     Findings: Erythema (bilateral lower extremities ) and rash (bilateral lower extremities with erythematous rash and excoriation. Also has bilateral arm dryness) present.  Neurological:     General: No focal deficit present.     Mental Status: She is alert and oriented to person, place, and time.         Assessment And Plan:     1. Essential hypertension  [facility] Pharmacist spoke with patient in the office - Ambulatory referral to Chronic Care Management Services  2. Edema of both lower extremities  Edema has improved since last visit, however this is still present with erythema and excoriation I am concerned about possible venous insufficiency.    I am ordering and venous   3. Erythema of lower extremity  Bilateral erythema bilateral lower extremities  Will check her lower extremity ultrasound with ABI to evaluate for any  insufficiency - US ARTERIAL ABI (SCREENING LOWER EXTREMITY); Future  I offered to speak with the patients son about the visit because this is her third time in the last 6 weeks to make sure she understand what the plan was, she was resistant to this.  We will call tomorrow about her dermatology referral and call the patient back  Minette Brine, FNP    THE PATIENT IS ENCOURAGED TO PRACTICE SOCIAL DISTANCING DUE TO THE COVID-19 PANDEMIC.

## 2019-08-22 ENCOUNTER — Ambulatory Visit: Payer: Self-pay

## 2019-08-22 ENCOUNTER — Telehealth: Payer: Self-pay

## 2019-08-22 DIAGNOSIS — R413 Other amnesia: Secondary | ICD-10-CM

## 2019-08-22 DIAGNOSIS — H9190 Unspecified hearing loss, unspecified ear: Secondary | ICD-10-CM

## 2019-08-22 DIAGNOSIS — E039 Hypothyroidism, unspecified: Secondary | ICD-10-CM

## 2019-08-22 DIAGNOSIS — I10 Essential (primary) hypertension: Secondary | ICD-10-CM

## 2019-08-22 NOTE — Chronic Care Management (AMB) (Signed)
  Chronic Care Management   Outreach Note  08/22/2019 Name: Brianna [name] MRN: [id number] DOB: November 12, 1932  Referred by: [name], MD Reason for referral : Chronic Care Management (CCM RNCM Telephone Follow up )   An unsuccessful telephone outreach was attempted today. The patient was referred to the case management team by [name] MD for assistance with care management and care coordination.   Follow Up Plan: Telephone follow up appointment with care management team member scheduled for: 08/23/19   Barb Merino, RN, BSN, CCM Care Management Coordinator [place] [facility]  Direct Phone: [phone]

## 2019-08-23 ENCOUNTER — Ambulatory Visit (INDEPENDENT_AMBULATORY_CARE_PROVIDER_SITE_OTHER): Payer: Medicare HMO

## 2019-08-23 DIAGNOSIS — I10 Essential (primary) hypertension: Secondary | ICD-10-CM | POA: Diagnosis not present

## 2019-08-23 DIAGNOSIS — L03115 Cellulitis of right lower limb: Secondary | ICD-10-CM

## 2019-08-23 DIAGNOSIS — R413 Other amnesia: Secondary | ICD-10-CM

## 2019-08-23 DIAGNOSIS — E039 Hypothyroidism, unspecified: Secondary | ICD-10-CM

## 2019-08-23 DIAGNOSIS — R600 Localized edema: Secondary | ICD-10-CM

## 2019-08-23 NOTE — Progress Notes (Signed)
Chronic Care Management   Initial Visit Note  08/19/2019 Name: Brianna [name] MRN: [id number] DOB: May 28, 1933  Referred by: [name], MD Reason for referral : Chronic Care Management   Brianna [name] is a 85 y.o. year old female who is a primary care patient of [name], MD. The CCM team was consulted for assistance with chronic disease management and care coordination needs related to HTN  Review of patient status, including review of consultants reports, relevant laboratory and other test results, and collaboration with appropriate care team members and the patient's provider was performed as part of comprehensive patient evaluation and provision of chronic care management services.    I spoke with Brianna [name] in clinic today.  Medications: Outpatient Encounter Medications as of 08/19/2019  Medication Sig  . amLODipine (NORVASC) 5 MG tablet TAKE 1 TABLET BY MOUTH EVERY DAY  . aspirin 81 MG tablet Take 81 mg by mouth daily.  . Cholecalciferol (VITAMIN D3) 125 MCG (5000 UT) CAPS Take 5,000 Units by mouth daily.   [name] doxycycline (VIBRAMYCIN) 100 MG capsule Take 1 capsule (100 mg total) by mouth 2 (two) times daily.  . fish oil-omega-3 fatty acids 1000 MG capsule Take 1 g by mouth daily.   . furosemide (LASIX) 40 MG tablet Take 40 mg by mouth daily as needed for fluid or edema. She forgets to take it  . Multiple Vitamin (MULTIVITAMIN WITH MINERALS) TABS Take 1 tablet by mouth daily.   [name] senna (SENOKOT) 8.6 MG TABS tablet Take 1 tablet by mouth daily as needed for mild constipation.  [name] SYNTHROID 125 MCG tablet Monday - Saturday   No facility-administered encounter medications on file as of 08/19/2019.      Objective:   Goals Addressed            This Visit's Progress     Patient Stated   . "I get confused on how to take my thyroid medication" (pt-stated)       Current Barriers:  [name] Knowledge Deficit related to Hypothyroidism  . Impaired memory  Nurse Case  Manager Clinical Goal(s):  [name] Over the next 90 days, patients thyroid levels will be within normal range. Goal Not Met . 03/29/19: Goal reestablished-Over the next 30 days, patient will report taking her Synthroid exactly as prescribed w/o missed or delayed doses.  [name] 03/29/19: Over the next 90 days, patient's thyroid level will be within normal range.    CCM RN CM Interventions:  07/31/19 call completed with patient   . Assessed for adherence to patient following her medication regimen for taking Synthroid as directed by [name] Cancer  (pt states she is adhering and is using her pill box to help her remember to take meds, she is taking thyroid medication in the am 1-2 hours before breakfast) during the call patient realized she has missed taking her synthroid this morning, she is eating breakfast and will wait 2 hours afterwards before taking this dose  Reinforced and discussed most recent med regimen recommended by [name] Cancer; patient reports taking Synthroid Monday through Saturday none on Sunday as directed - patient uses a pill box and has established a daily routine   Discussed patient completed her SNV for medication management - reviewed pill package options with patient, she feels very comfortable with her current process and is using a pill box, pt declines wanting to change her established routine at this time . Discussed plans with patient for ongoing care management follow up and provided patient  with direct contact information for care management team  CCM PharmD Interventions:  08/19/19 clinic visit completed with patient  . Patient states she sometimes forgets doses of medications.  She had 2 bottles with her today (amlodipine and Synthroid).  Patient states she uses pill box at home.  She may benefit from adherence packaging, but does not wish to pursue. . Patient complains of edema in lower ankles likely due to venous stasis per PCP.   [name] Encouraged Synthroid dosing in the AM before  breakfast.  Monday through Friday. . Encouraged patient to have family help her with her medications.  She appears compliant with fill history after review of patient's insurance claims.   Patient Self Care Activities:   Verbalizes understanding of the education/information provided today  Self administers medications as prescribed . Drives self and attends all scheduled provider appointments . Calls pharmacy for medication refills . Performs ADL's independently . Calls provider office for new concerns or questions   Please see past updates related to this goal by clicking on the "Past Updates" button in the selected goal           Plan:   The care management team will reach out to the patient again over the next 30 days.   Provider Signature Regina Eck, PharmD, BCPS Clinical Pharmacist, [facility] [place]: [phone]

## 2019-08-23 NOTE — Patient Instructions (Signed)
Visit Information  Goals Addressed            This Visit's Progress     Patient Stated   . "I get confused on how to take my thyroid medication" (pt-stated)       Current Barriers:  . Knowledge Deficit related to Hypothyroidism  . Impaired memory  Nurse Case Manager Clinical Goal(s):  . Over the next 90 days, patients thyroid levels will be within normal range. Goal Not Met . 03/29/19: Goal reestablished-Over the next 30 days, patient will report taking her Synthroid exactly as prescribed w/o missed or delayed doses.  . 03/29/19: Over the next 90 days, patient's thyroid level will be within normal range.    CCM RN CM Interventions:  07/31/19 call completed with patient   . Assessed for adherence to patient following her medication regimen for taking Synthroid as directed by Dr. Sanders  (pt states she is adhering and is using her pill box to help her remember to take meds, she is taking thyroid medication in the am 1-2 hours before breakfast) during the call patient realized she has missed taking her synthroid this morning, she is eating breakfast and will wait 2 hours afterwards before taking this dose  Reinforced and discussed most recent med regimen recommended by Dr. Sanders; patient reports taking Synthroid Monday through Saturday none on Sunday as directed - patient uses a pill box and has established a daily routine   Discussed patient completed her SNV for medication management - reviewed pill package options with patient, she feels very comfortable with her current process and is using a pill box, pt declines wanting to change her established routine at this time . Discussed plans with patient for ongoing care management follow up and provided patient with direct contact information for care management team  CCM PharmD Interventions:  08/19/19 clinic visit completed with patient  . Patient states she sometimes forgets doses of medications.  She had 2 bottles with her today  (amlodipine and Synthroid).  Patient states she uses pill box at home.  She may benefit from adherence packaging, but does not wish to pursue. . Patient complains of edema in lower ankles likely due to venous stasis per PCP.   . Encouraged Synthroid dosing in the AM before breakfast.  Monday through Friday. . Encouraged patient to have family help her with her medications.  She appears compliant with fill history after review of patient's insurance claims.   Patient Self Care Activities:   Verbalizes understanding of the education/information provided today  Self administers medications as prescribed . Drives self and attends all scheduled provider appointments . Calls pharmacy for medication refills . Performs ADL's independently . Calls provider office for new concerns or questions   Please see past updates related to this goal by clicking on the "Past Updates" button in the selected goal          The patient verbalized understanding of instructions provided today and declined a print copy of patient instruction materials.   The care management team will reach out to the patient again over the next 30 days.   SIGNATURE  Dattero , PharmD, BCPS Clinical Pharmacist, [facility] [place]  [facility]  Direct Dial: [phone]   

## 2019-08-26 DIAGNOSIS — I8311 Varicose veins of right lower extremity with inflammation: Secondary | ICD-10-CM | POA: Diagnosis not present

## 2019-08-26 DIAGNOSIS — I872 Venous insufficiency (chronic) (peripheral): Secondary | ICD-10-CM | POA: Diagnosis not present

## 2019-08-26 DIAGNOSIS — I8312 Varicose veins of left lower extremity with inflammation: Secondary | ICD-10-CM | POA: Diagnosis not present

## 2019-08-26 NOTE — Chronic Care Management (AMB) (Signed)
Chronic Care Management   Follow Up Note   08/23/2019 Name: Brianna [name] MRN: [id number] DOB: May 08, 1933  Referred by: [name], MD Reason for referral : Chronic Care Management (CCM RNCM Telephone Follow up )   Brianna [name] is a 85 y.o. year old female who is a primary care patient of [name], MD. The CCM team was consulted for assistance with chronic disease management and care coordination needs.    Review of patient status, including review of consultants reports, relevant laboratory and other test results, and collaboration with appropriate care team members and the patient's provider was performed as part of comprehensive patient evaluation and provision of chronic care management services.    SDOH (Social Determinants of Health) screening performed today: None. See Care Plan for related entries.   I spoke with Brianna [name] by telephone today to assist with Coordination of Care to evaluate patient's bilateral lower leg edema and erythema.   Outpatient Encounter Medications as of 08/23/2019  Medication Sig  . amLODipine (NORVASC) 5 MG tablet TAKE 1 TABLET BY MOUTH EVERY DAY  . aspirin 81 MG tablet Take 81 mg by mouth daily.  . Cholecalciferol (VITAMIN D3) 125 MCG (5000 UT) CAPS Take 5,000 Units by mouth daily.   [name] doxycycline (VIBRAMYCIN) 100 MG capsule Take 1 capsule (100 mg total) by mouth 2 (two) times daily.  . fish oil-omega-3 fatty acids 1000 MG capsule Take 1 g by mouth daily.   . furosemide (LASIX) 40 MG tablet Take 40 mg by mouth daily as needed for fluid or edema. She forgets to take it  . Multiple Vitamin (MULTIVITAMIN WITH MINERALS) TABS Take 1 tablet by mouth daily.   [name] senna (SENOKOT) 8.6 MG TABS tablet Take 1 tablet by mouth daily as needed for mild constipation.  [name] SYNTHROID 125 MCG tablet Monday - Saturday   No facility-administered encounter medications on file as of 08/23/2019.      Goals Addressed      Patient Stated   . "My legs are  still swollen & red" (pt-stated)       Current Barriers:  [name] Knowledge Deficits related to diagnosis and treatment of bilateral lower leg edema . Suspicious for Venous Insufficiency   Nurse Case Manager Clinical Goal(s):  [name] Over the next 30 days, patient will have a new appointment scheduled with [facility] to evaluate bilateral lower extremity edema . Over the next 60 days, patient will work with the CCM team and PCP to address needs related to education and support for diagnosis/treatment of bilateral lower leg edema and erythema  CCM RN CM Interventions:  08/23/19 call completed with patient   . Evaluation of current treatment plan related to bilateral lower leg edema and erythema and patient's adherence to plan as established by provider. . Provided education to patient re: PCP ordered a test to evaluate bilateral lower leg edema; assisted patient with a joint call to [place] [facility] to schedule an Arterial US; spoke with Juliann Pulse from [place] who advised the test ordered will not be sufficient to evaluate for Venous Insufficiency, she advised the PCP will need to reorder  . Collaborated with PCP Minette Brine FNP via in basket message regarding the information given by Juliann Pulse at [place] that the Arterial US is not recommended to rule out Venous Insufficiency  . Discussed plans with patient for ongoing care management follow up and provided patient with direct contact information for care management team  Patient Self Care Activities:  .  Self administers medications as prescribed . Attends all scheduled provider appointments . Performs ADL's independently . Performs IADL's independently . Calls provider office for new concerns or questions  Initial goal documentation     . "My skin has been dry & itchy" (pt-stated)       Current Barriers:  [name] Knowledge Deficits related to treatment management for impaired skin integrity . Hypothyroidism  Nurse Case  Manager Clinical Goal(s):  [name] Over the next 30 days, patient will report her skin is intact and without cellulitis and or open wounds . Over the next 90 days, patient will verbalize having an increased understanding of how to identify early s/s of impaired skin integrity and will verbalize increased understanding about how to properly care for her skin to decrease the risk for having open wounds or other complications that may occur from impaired skin integrity  CCM RN CM Interventions:  08/23/19 call completed with patient   . Evaluation of current treatment plan related to Cellulitis of right lower leg and patient's adherence to plan as established by provider. . Advised patient to keep her skin clean and dry and to continue to follow PCP recommendations for Self Care  . Provided education to patient re: diagnosis of Cellulitis to right lower extremity and need for dermatology referral . Discussed plans with patient for ongoing care management follow up and provided patient with direct contact information for care management team . Assisted patient with joint call to [facility]; appointment scheduled for patient to f/u with Dr. Jamse Belfast for Monday, 09/25/19 @ 10:10 AM . Encouraged patient to have her son attend the appointment with her so to have an extra set of ears for listening to the MD recommendations for treatment/home care  Patient Self Care Activities:  . Self administers medications as prescribed . Attends all scheduled provider appointments . Calls pharmacy for medication refills . Attends church or other social activities . Performs ADL's independently . Performs IADL's independently . Calls provider office for new concerns or questions  Please see past updates related to this goal by clicking on the "Past Updates" button in the selected goal         Telephone follow up appointment with care management team member scheduled for: 10/04/19   Barb Merino, RN,  BSN, CCM Care Management Coordinator [facility]  Direct Phone: [phone]

## 2019-08-26 NOTE — Patient Instructions (Signed)
Visit Information  Goals Addressed      Patient Stated   . "My legs are still swollen & red" (pt-stated)       Current Barriers:  [name] Knowledge Deficits related to diagnosis and treatment of bilateral lower leg edema . Suspicious for Venous Insufficiency   Nurse Case Manager Clinical Goal(s):  [name] Over the next 30 days, patient will have a new appointment scheduled with [facility] to evaluate bilateral lower extremity edema . Over the next 60 days, patient will work with the CCM team and PCP to address needs related to education and support for diagnosis/treatment of bilateral lower leg edema and erythema  CCM RN CM Interventions:  08/23/19 call completed with patient   . Evaluation of current treatment plan related to bilateral lower leg edema and erythema and patient's adherence to plan as established by provider. . Provided education to patient re: PCP ordered a test to evaluate bilateral lower leg edema; assisted patient with a joint call to [place] to schedule an Arterial US; spoke with Juliann Pulse from [place] who advised the test ordered will not be sufficient to evaluate for Venous Insufficiency, she advised the PCP will need to reorder  . Collaborated with PCP Minette Brine FNP via in basket message regarding the information given by Juliann Pulse at [place] that the Arterial US is not recommended to rule out Venous Insufficiency  . Discussed plans with patient for ongoing care management follow up and provided patient with direct contact information for care management team  Patient Self Care Activities:  . Self administers medications as prescribed . Attends all scheduled provider appointments . Performs ADL's independently . Performs IADL's independently . Calls provider office for new concerns or questions  Initial goal documentation     . "My skin has been dry & itchy" (pt-stated)       Current Barriers:  [name] Knowledge Deficits related to treatment  management for impaired skin integrity . Hypothyroidism  Nurse Case Manager Clinical Goal(s):  [name] Over the next 30 days, patient will report her skin is intact and without cellulitis and or open wounds . Over the next 90 days, patient will verbalize having an increased understanding of how to identify early s/s of impaired skin integrity and will verbalize increased understanding about how to properly care for her skin to decrease the risk for having open wounds or other complications that may occur from impaired skin integrity  CCM RN CM Interventions:  08/23/19 call completed with patient   . Evaluation of current treatment plan related to Cellulitis of right lower leg and patient's adherence to plan as established by provider. . Advised patient to keep her skin clean and dry and to continue to follow PCP recommendations for Self Care  . Provided education to patient re: diagnosis of Cellulitis to right lower extremity and need for dermatology referral . Discussed plans with patient for ongoing care management follow up and provided patient with direct contact information for care management team . Assisted patient with joint call to [facility]; appointment scheduled for patient to f/u with Dr. Jamse Belfast for Monday, 09/25/19 @ 10:10 AM . Encouraged patient to have her son attend the appointment with her so to have an extra set of ears for listening to the MD recommendations for treatment/home care  Patient Self Care Activities:  . Self administers medications as prescribed . Attends all scheduled provider appointments . Calls pharmacy for medication refills . Attends church or other social activities .  Performs ADL's independently . Performs IADL's independently . Calls provider office for new concerns or questions   Please see past updates related to this goal by clicking on the "Past Updates" button in the selected goal         The patient verbalized understanding of  instructions provided today and declined a print copy of patient instruction materials.   Telephone follow up appointment with care management team member scheduled for: 10/04/19  Barb Merino, RN, BSN, CCM Care Management Coordinator [facility]  Direct Phone: [phone]

## 2019-08-27 ENCOUNTER — Other Ambulatory Visit: Payer: Self-pay | Admitting: Nurse Practitioner

## 2019-08-27 DIAGNOSIS — R600 Localized edema: Secondary | ICD-10-CM

## 2019-08-29 ENCOUNTER — Telehealth: Payer: Self-pay

## 2019-09-11 ENCOUNTER — Ambulatory Visit: Payer: Self-pay | Admitting: Internal Medicine

## 2019-09-11 ENCOUNTER — Other Ambulatory Visit: Payer: Self-pay

## 2019-09-11 ENCOUNTER — Ambulatory Visit: Payer: Self-pay

## 2019-09-17 ENCOUNTER — Encounter: Payer: Self-pay | Admitting: Internal Medicine

## 2019-09-17 ENCOUNTER — Ambulatory Visit (INDEPENDENT_AMBULATORY_CARE_PROVIDER_SITE_OTHER): Payer: Medicare HMO

## 2019-09-17 ENCOUNTER — Ambulatory Visit (INDEPENDENT_AMBULATORY_CARE_PROVIDER_SITE_OTHER): Payer: Medicare HMO | Admitting: Internal Medicine

## 2019-09-17 ENCOUNTER — Other Ambulatory Visit: Payer: Self-pay

## 2019-09-17 VITALS — BP 134/70 | HR 85 | Temp 97.9°F | Ht 63.6 in | Wt 148.1 lb

## 2019-09-17 VITALS — BP 134/70 | HR 85 | Temp 97.9°F | Ht 63.6 in | Wt 148.0 lb

## 2019-09-17 DIAGNOSIS — Z23 Encounter for immunization: Secondary | ICD-10-CM | POA: Diagnosis not present

## 2019-09-17 DIAGNOSIS — E039 Hypothyroidism, unspecified: Secondary | ICD-10-CM | POA: Diagnosis not present

## 2019-09-17 DIAGNOSIS — N182 Chronic kidney disease, stage 2 (mild): Secondary | ICD-10-CM

## 2019-09-17 DIAGNOSIS — Z0000 Encounter for general adult medical examination without abnormal findings: Secondary | ICD-10-CM | POA: Diagnosis not present

## 2019-09-17 DIAGNOSIS — R600 Localized edema: Secondary | ICD-10-CM | POA: Diagnosis not present

## 2019-09-17 DIAGNOSIS — Z6825 Body mass index (BMI) 25.0-25.9, adult: Secondary | ICD-10-CM | POA: Diagnosis not present

## 2019-09-17 DIAGNOSIS — I129 Hypertensive chronic kidney disease with stage 1 through stage 4 chronic kidney disease, or unspecified chronic kidney disease: Secondary | ICD-10-CM

## 2019-09-17 DIAGNOSIS — E663 Overweight: Secondary | ICD-10-CM

## 2019-09-17 DIAGNOSIS — I10 Essential (primary) hypertension: Secondary | ICD-10-CM | POA: Diagnosis not present

## 2019-09-17 MED ORDER — FUROSEMIDE 40 MG PO TABS: 40.0000 mg | ORAL_TABLET | Freq: Every day | ORAL | 0 refills | Status: DC | PRN

## 2019-09-17 MED ORDER — PREVNAR 13 IM SUSP: 0.5000 mL | INTRAMUSCULAR | 0 refills | Status: AC

## 2019-09-17 MED ORDER — BOOSTRIX 5-2.5-18.5 LF-MCG/0.5 IM SUSP: 0.5000 mL | Freq: Once | INTRAMUSCULAR | 0 refills | Status: AC

## 2019-09-17 NOTE — Progress Notes (Signed)
This visit occurred during the SARS-CoV-2 public health emergency.  Safety protocols were in place, including screening questions prior to the visit, additional usage of staff PPE, and extensive cleaning of exam room while observing appropriate contact time as indicated for disinfecting solutions.  Subjective:   ROSELLEN LICHTENBERGER is a 85 y.o. female who presents for Medicare Annual (Subsequent) preventive examination.  Review of Systems:  n/a Cardiac Risk Factors include: advanced age (>79mn, >>52women);hypertension     Objective:     Vitals: BP 134/70 (BP Location: Left Arm, Patient Position: Sitting, Cuff Size: Normal)   Pulse 85   Temp 97.9 F (36.6 C) (Oral)   Ht 5' 3.6" (1.615 m)   Wt 148 lb (67.1 kg)   SpO2 97%   BMI 25.72 kg/m   Body mass index is 25.72 kg/m.  Advanced Directives 09/17/2019 12/27/2018 09/02/2018 03/10/2015 07/24/2014 02/13/2013 02/06/2013  Does Patient Have a Medical Advance Directive? _0  Patient does not have advance directive Patient does not have advance directive;Patient would like information  Would patient like information on creating a medical advance directive? - Yes (MAU/Ambulatory/Procedural Areas - Information given) Yes (ED - Information included in AVS) No - patient declined information Yes - Educational materials given - Advance directive packet given  Pre-existing out of facility DNR order (yellow form or pink MOST form) - - - - - No -    Tobacco Social History   Tobacco Use  Smoking Status Never Smoker  Smokeless Tobacco Never Used     Counseling given: Not Answered   Clinical Intake:  Pre-visit preparation completed: Yes  Pain : 0-10 Pain Score: 7  Pain Type: Acute pain Pain Location: Abdomen Pain Orientation: Left Pain Radiating Towards: none Pain Descriptors / Indicators: Constant Pain Onset: 1 to 4 weeks ago Pain Frequency: Intermittent Pain Relieving Factors: just goes about the day Effect of Pain on Daily  Activities: none  Pain Relieving Factors: just goes about the day  Nutritional Status: BMI 25 -29 Overweight Nutritional Risks: None Diabetes: No  How often do you need to have someone help you when you read instructions, pamphlets, or other written materials from your doctor or pharmacy?: 1 - Never What is the last grade level you completed in school?: half way to master's degree  Interpreter Needed?: No  Information entered by :: NAllen LPN  Past Medical History:  Diagnosis Date  . Headache 12/24/2013  . Headache(784.0)   . Hypertension    pcp  [facility]  . Hypothyroid   . MGUS (monoclonal gammopathy of unknown significance) 12/10/2013   Past Surgical History:  Procedure Laterality Date  . ABDOMINAL HYSTERECTOMY    . bladder tac    . BREAST BIOPSY Right 04/2017  . CATARACT EXTRACTION W/PHACO  08/22/2012   Procedure: CATARACT EXTRACTION PHACO AND INTRAOCULAR LENS PLACEMENT (IOC);  Surgeon: GAdonis Brook MD;  [place] [facility]  Service: Ophthalmology;  Laterality: Left;  . CATARACT EXTRACTION W/PHACO Right 02/13/2013   Procedure: CATARACT EXTRACTION PHACO AND INTRAOCULAR LENS PLACEMENT (IOC);  Surgeon: GAdonis Brook MD;  [facility] [facility]  Service: Ophthalmology;  Laterality: Right;   Family History  Problem Relation Age of Onset  . Cancer Maternal Grandfather        stomach cancer  . Cancer Paternal Grandfather        stomach cancer  . Healthy Mother   . Dementia Mother        at [phone].o  . Healthy Father  Social History   Socioeconomic History  . Marital status: Widowed    Spouse name: Not on file  . Number of children: 3  . Years of education: 17.5-18   . Highest education level: Bachelor's degree (e.g., BA, AB, BS)  Occupational History  . Occupation: retired  Scientific laboratory technician  . Financial resource strain: Not hard at all  . Food insecurity    Worry: Never true    Inability: Never true  . Transportation needs    Medical: No    Non-medical: No  Tobacco  Use  . Smoking status: Never Smoker  . Smokeless tobacco: Never Used  Substance and Sexual Activity  . Alcohol use: Yes    Comment: socially  . Drug use: No  . Sexual activity: Not Currently  Lifestyle  . Physical activity    Days per week: 7 days    Minutes per session: 40 min  . Stress: Only a little  Relationships  . Social connections    Talks on phone: More than three times a week    Gets together: More than three times a week    Attends religious service: Never    Active member of club or organization: Yes    Attends meetings of clubs or organizations: More than 4 times per year    Relationship status: Widowed  Other Topics Concern  . Not on file  Social History Narrative   Lives at home. Her sons live with her.    Right handed    Outpatient Encounter Medications as of 09/17/2019  Medication Sig  . amLODipine (NORVASC) 5 MG tablet TAKE 1 TABLET BY MOUTH EVERY DAY  . aspirin 81 MG tablet Take 81 mg by mouth daily.  . furosemide (LASIX) 40 MG tablet Take 40 mg by mouth daily as needed for fluid or edema. She forgets to take it  . Melatonin 5 MG TABS Take 1 tablet by mouth at bedtime as needed.  . Multiple Vitamin (MULTIVITAMIN WITH MINERALS) TABS Take 1 tablet by mouth daily.   [name] senna (SENOKOT) 8.6 MG TABS tablet Take 1 tablet by mouth daily as needed for mild constipation.  [name] SYNTHROID 125 MCG tablet Monday - Saturday  . Cholecalciferol (VITAMIN D3) 125 MCG (5000 UT) CAPS Take 5,000 Units by mouth daily.   [name] doxycycline (VIBRAMYCIN) 100 MG capsule Take 1 capsule (100 mg total) by mouth 2 (two) times daily. (Patient not taking: Reported on 09/17/2019)  . fish oil-omega-3 fatty acids 1000 MG capsule Take 1 g by mouth daily.   . pneumococcal 13-valent conjugate vaccine (PREVNAR 13) SUSP injection Inject 0.5 mLs into the muscle tomorrow at 10 am for 1 dose.  . Tdap (BOOSTRIX) 5-2.5-18.5 LF-MCG/0.5 injection Inject 0.5 mLs into the muscle once for 1 dose.   No  facility-administered encounter medications on file as of 09/17/2019.     Activities of Daily Living In your present state of health, do you have any difficulty performing the following activities: 09/17/2019 07/16/2019  Hearing? Tempie Donning  Comment has a hearing aide that she does not use left ear  Vision? N N  Difficulty concentrating or making decisions? N Y  Comment - remembering  Walking or climbing stairs? N N  Dressing or bathing? N N  Doing errands, shopping? N N  Preparing Food and eating ? N -  Using the Toilet? N -  In the past six months, have you accidently leaked urine? N -  Do you have problems with loss of bowel  control? N -  Managing your Medications? N -  Managing your Finances? N -  Housekeeping or managing your Housekeeping? N -  Some recent data might be hidden    Patient Care Team: [name], MD as PCP - General (Internal Medicine) Heath Lark, MD as Consulting Physician (Hematology and Oncology) Daneen Schick as Social Worker [place], Claudette Stapler, RN as Case Manager Lavera Guise, [facility] (Pharmacist)    Assessment:   This is a routine wellness examination for [place].  Exercise Activities and Dietary recommendations Current Exercise Habits: Home exercise routine, Type of exercise: walking, Time (Minutes): 40, Frequency (Times/Week): 7, Weekly Exercise (Minutes/Week): 280  Goals    . "I get confused on how to take my thyroid medication" (pt-stated)     Current Barriers:  [name] Knowledge Deficit related to Hypothyroidism  . Impaired memory  Nurse Case Manager Clinical Goal(s):  [name] Over the next 90 days, patients thyroid levels will be within normal range. Goal Not Met . 03/29/19: Goal reestablished-Over the next 30 days, patient will report taking her Synthroid exactly as prescribed w/o missed or delayed doses.  [name] 03/29/19: Over the next 90 days, patient's thyroid level will be within normal range.    CCM RN CM Interventions:  07/31/19 call completed with patient    . Assessed for adherence to patient following her medication regimen for taking Synthroid as directed by [name] Cancer  (pt states she is adhering and is using her pill box to help her remember to take meds, she is taking thyroid medication in the am 1-2 hours before breakfast) during the call patient realized she has missed taking her synthroid this morning, she is eating breakfast and will wait 2 hours afterwards before taking this dose  Reinforced and discussed most recent med regimen recommended by [name] Cancer; patient reports taking Synthroid Monday through Saturday none on Sunday as directed - patient uses a pill box and has established a daily routine   Discussed patient completed her SNV for medication management - reviewed pill package options with patient, she feels very comfortable with her current process and is using a pill box, pt declines wanting to change her established routine at this time . Discussed plans with patient for ongoing care management follow up and provided patient with direct contact information for care management team  CCM PharmD Interventions:  08/19/19 clinic visit completed with patient  . Patient states she sometimes forgets doses of medications.  She had 2 bottles with her today (amlodipine and Synthroid).  Patient states she uses pill box at home.  She may benefit from adherence packaging, but does not wish to pursue. . Patient complains of edema in lower ankles likely due to venous stasis per PCP.   [name] Encouraged Synthroid dosing in the AM before breakfast.  Monday through Friday. . Encouraged patient to have family help her with her medications.  She appears compliant with fill history after review of patient's insurance claims.   Patient Self Care Activities:   Verbalizes understanding of the education/information provided today  Self administers medications as prescribed . Drives self and attends all scheduled provider appointments . Calls pharmacy for  medication refills . Performs ADL's independently . Calls provider office for new concerns or questions   Please see past updates related to this goal by clicking on the "Past Updates" button in the selected goal       . "My legs are still swollen & red" (pt-stated)     Current Barriers:  .  Knowledge Deficits related to diagnosis and treatment of bilateral lower leg edema . Suspicious for Venous Insufficiency   Nurse Case Manager Clinical Goal(s):  [name] Over the next 30 days, patient will have a new appointment scheduled with [facility] to evaluate bilateral lower extremity edema . Over the next 60 days, patient will work with the CCM team and PCP to address needs related to education and support for diagnosis/treatment of bilateral lower leg edema and erythema  CCM RN CM Interventions:  08/23/19 call completed with patient   . Evaluation of current treatment plan related to bilateral lower leg edema and erythema and patient's adherence to plan as established by provider. . Provided education to patient re: PCP ordered a test to evaluate bilateral lower leg edema; assisted patient with a joint call to [facility] to schedule an Arterial US; spoke with Juliann Pulse from [facility] who advised the test ordered will not be sufficient to evaluate for Venous Insufficiency, she advised the PCP will need to reorder  . Collaborated with PCP Minette Brine FNP via in basket message regarding the information given by Juliann Pulse at [facility] that the Arterial US is not recommended to rule out Venous Insufficiency  . Discussed plans with patient for ongoing care management follow up and provided patient with direct contact information for care management team  Patient Self Care Activities:  . Self administers medications as prescribed . Attends all scheduled provider appointments . Performs ADL's independently . Performs IADL's independently . Calls provider office for new concerns  or questions  Initial goal documentation     . "My skin has been dry & itchy" (pt-stated)     Current Barriers:  [name] Knowledge Deficits related to treatment management for impaired skin integrity . Hypothyroidism  Nurse Case Manager Clinical Goal(s):  [name] Over the next 30 days, patient will report her skin is intact and without cellulitis and or open wounds . Over the next 90 days, patient will verbalize having an increased understanding of how to identify early s/s of impaired skin integrity and will verbalize increased understanding about how to properly care for her skin to decrease the risk for having open wounds or other complications that may occur from impaired skin integrity  CCM RN CM Interventions:  08/23/19 call completed with patient   . Evaluation of current treatment plan related to Cellulitis of right lower leg and patient's adherence to plan as established by provider. . Advised patient to keep her skin clean and dry and to continue to follow PCP recommendations for Self Care  . Provided education to patient re: diagnosis of Cellulitis to right lower extremity and need for dermatology referral . Discussed plans with patient for ongoing care management follow up and provided patient with direct contact information for care management team . Assisted patient with joint call to [facility]; appointment scheduled for patient to f/u with Dr. Jamse Belfast for Monday, 09/25/19 @ 10:10 AM . Encouraged patient to have her son attend the appointment with her so to have an extra set of ears for listening to the MD recommendations for treatment/home care  Patient Self Care Activities:  . Self administers medications as prescribed . Attends all scheduled provider appointments . Calls pharmacy for medication refills . Attends church or other social activities . Performs ADL's independently . Performs IADL's independently . Calls provider office for new concerns or questions    Please see past updates related to this goal by clicking on the "Past Updates" button in  the selected goal      . Patient Stated     09/17/2019, does not want to gain any weight       Fall Risk Fall Risk  09/17/2019 08/19/2019 07/10/2019 03/25/2019 02/25/2019  Falls in the past year? 0 0 0 0 0  Risk for fall due to : Medication side effect - - - -  Follow up Falls evaluation completed;Education provided;Falls prevention discussed - - - -   Is the patient's home free of loose throw rugs in walkways, pet beds, electrical cords, etc?   yes      Grab bars in the bathroom? yes      Handrails on the stairs?   yes      Adequate lighting?   yes  Timed Get Up and Go performed: n/a  Depression Screen PHQ 2/9 Scores 09/17/2019 08/19/2019 07/10/2019 03/25/2019  PHQ - 2 Score 0 0 0 0  PHQ- 9 Score 3 - - -     Cognitive Function MMSE - Mini Mental State Exam 04/17/2019  Orientation to time 4  Orientation to Place 5  Registration 3  Attention/ Calculation 1  Recall 0  Language- name 2 objects 2  Language- repeat 0  Language- follow 3 step command 3  Language- read & follow direction 1  Write a sentence 1  Copy design 1  Total score 21     6CIT Screen 09/17/2019 02/25/2019  What Year? 4 points 0 points  What month? 0 points 0 points  What time? 0 points 0 points  Count back from 20 0 points 0 points  Months in reverse 0 points 4 points  Repeat phrase 10 points 10 points  Total Score 14 14    Immunization History  Administered Date(s) Administered  . Influenza-Unspecified 07/17/2014    Qualifies for Shingles Vaccine? yes  Screening Tests Health Maintenance  Topic Date Due  . TETANUS/TDAP  03/26/1952  . PNA vac Low Risk Adult (1 of 2 - PCV13) 03/26/2000  . INFLUENZA VACCINE  Completed  . DEXA SCAN  Completed    Cancer Screenings: Lung: Low Dose CT Chest recommended if Age 72-80 years, 30 pack-year currently smoking OR have quit w/in 15years. Patient does not qualify. Breast:   Up to date on Mammogram? Yes   Up to date of Bone Density/Dexa? Yes Colorectal: not required  Additional Screenings: : Hepatitis C Screening: n/a     Plan:    Patient wants to maintain her weight and not gain any.   I have personally reviewed and noted the following in the patient's chart:   . Medical and social history . Use of alcohol, tobacco or illicit drugs  . Current medications and supplements . Functional ability and status . Nutritional status . Physical activity . Advanced directives . List of other physicians . Hospitalizations, surgeries, and ER visits in previous 12 months . Vitals . Screenings to include cognitive, depression, and falls . Referrals and appointments  In addition, I have reviewed and discussed with patient certain preventive protocols, quality metrics, and best practice recommendations. A written personalized care plan for preventive services as well as general preventive health recommendations were provided to patient.     Kellie Simmering, LPN  [id number]

## 2019-09-17 NOTE — Patient Instructions (Signed)
Ms. Brianna [name] , Thank you for taking time to come for your Medicare Wellness Visit. I appreciate your ongoing commitment to your health goals. Please review the following plan we discussed and let me know if I can assist you in the future.   Screening recommendations/referrals: Colonoscopy: not required Mammogram: not required Bone Density: 11/2017 Recommended yearly ophthalmology/optometry visit for glaucoma screening and checkup Recommended yearly dental visit for hygiene and checkup  Vaccinations: Influenza vaccine: 05/2019 Pneumococcal vaccine: sent to pharmacy Tdap vaccine: sent to pharmacy Shingles vaccine: discussed    Advanced directives: Advance directive discussed with you today. Even though you declined this today please call our office should you change your mind and we can give you the proper paperwork for you to fill out.   Conditions/risks identified: overweight  Next appointment: 10/22/2019 at 11:30   Preventive Care 83 Years and Older, Female Preventive care refers to lifestyle choices and visits with your health care provider that can promote health and wellness. What does preventive care include?  A yearly physical exam. This is also called an annual well check.  Dental exams once or twice a year.  Routine eye exams. Ask your health care provider how often you should have your eyes checked.  Personal lifestyle choices, including:  Daily care of your teeth and gums.  Regular physical activity.  Eating a healthy diet.  Avoiding tobacco and drug use.  Limiting alcohol use.  Practicing safe sex.  Taking low-dose aspirin every day.  Taking vitamin and mineral supplements as recommended by your health care provider. What happens during an annual well check? The services and screenings done by your health care provider during your annual well check will depend on your age, overall health, lifestyle risk factors, and family history of disease. Counseling   Your health care provider may ask you questions about your:  Alcohol use.  Tobacco use.  Drug use.  Emotional well-being.  Home and relationship well-being.  Sexual activity.  Eating habits.  History of falls.  Memory and ability to understand (cognition).  Work and work Statistician.  Reproductive health. Screening  You may have the following tests or measurements:  Height, weight, and BMI.  Blood pressure.  Lipid and cholesterol levels. These may be checked every 5 years, or more frequently if you are over 64 years old.  Skin check.  Lung cancer screening. You may have this screening every year starting at age 20 if you have a 30-pack-year history of smoking and currently smoke or have quit within the past 15 years.  Fecal occult blood test (FOBT) of the stool. You may have this test every year starting at age 51.  Flexible sigmoidoscopy or colonoscopy. You may have a sigmoidoscopy every 5 years or a colonoscopy every 10 years starting at age 110.  Hepatitis C blood test.  Hepatitis B blood test.  Sexually transmitted disease (STD) testing.  Diabetes screening. This is done by checking your blood sugar (glucose) after you have not eaten for a while (fasting). You may have this done every 1-3 years.  Bone density scan. This is done to screen for osteoporosis. You may have this done starting at age 26.  Mammogram. This may be done every 1-2 years. Talk to your health care provider about how often you should have regular mammograms. Talk with your health care provider about your test results, treatment options, and if necessary, the need for more tests. Vaccines  Your health care provider may recommend certain vaccines, such as:  Influenza vaccine. This is recommended every year.  Tetanus, diphtheria, and acellular pertussis (Tdap, Td) vaccine. You may need a Td booster every 10 years.  Zoster vaccine. You may need this after age 55.  Pneumococcal 13-valent  conjugate (PCV13) vaccine. One dose is recommended after age 73.  Pneumococcal polysaccharide (PPSV23) vaccine. One dose is recommended after age 11. Talk to your health care provider about which screenings and vaccines you need and how often you need them. This information is not intended to replace advice given to you by your health care provider. Make sure you discuss any questions you have with your health care provider. Document Released: 10/30/2015 Document Revised: 06/22/2016 Document Reviewed: 08/04/2015 Elsevier Interactive Patient Education  2017 [place] Prevention in the Home Falls can cause injuries. They can happen to people of all ages. There are many things you can do to make your home safe and to help prevent falls. What can I do on the outside of my home?  Regularly fix the edges of walkways and driveways and fix any cracks.  Remove anything that might make you trip as you walk through a door, such as a raised step or threshold.  Trim any bushes or trees on the path to your home.  Use bright outdoor lighting.  Clear any walking paths of anything that might make someone trip, such as rocks or tools.  Regularly check to see if handrails are loose or broken. Make sure that both sides of any steps have handrails.  Any raised decks and porches should have guardrails on the edges.  Have any leaves, snow, or ice cleared regularly.  Use sand or salt on walking paths during winter.  Clean up any spills in your garage right away. This includes oil or grease spills. What can I do in the bathroom?  Use night lights.  Install grab bars by the toilet and in the tub and shower. Do not use towel bars as grab bars.  Use non-skid mats or decals in the tub or shower.  If you need to sit down in the shower, use a plastic, non-slip stool.  Keep the floor dry. Clean up any water that spills on the floor as soon as it happens.  Remove soap buildup in the tub or  shower regularly.  Attach bath mats securely with double-sided non-slip rug tape.  Do not have throw rugs and other things on the floor that can make you trip. What can I do in the bedroom?  Use night lights.  Make sure that you have a light by your bed that is easy to reach.  Do not use any sheets or blankets that are too big for your bed. They should not hang down onto the floor.  Have a firm chair that has side arms. You can use this for support while you get dressed.  Do not have throw rugs and other things on the floor that can make you trip. What can I do in the kitchen?  Clean up any spills right away.  Avoid walking on wet floors.  Keep items that you use a lot in easy-to-reach places.  If you need to reach something above you, use a strong step stool that has a grab bar.  Keep electrical cords out of the way.  Do not use floor polish or wax that makes floors slippery. If you must use wax, use non-skid floor wax.  Do not have throw rugs and other things on the floor that  can make you trip. What can I do with my stairs?  Do not leave any items on the stairs.  Make sure that there are handrails on both sides of the stairs and use them. Fix handrails that are broken or loose. Make sure that handrails are as long as the stairways.  Check any carpeting to make sure that it is firmly attached to the stairs. Fix any carpet that is loose or worn.  Avoid having throw rugs at the top or bottom of the stairs. If you do have throw rugs, attach them to the floor with carpet tape.  Make sure that you have a light switch at the top of the stairs and the bottom of the stairs. If you do not have them, ask someone to add them for you. What else can I do to help prevent falls?  Wear shoes that:  Do not have high heels.  Have rubber bottoms.  Are comfortable and fit you well.  Are closed at the toe. Do not wear sandals.  If you use a stepladder:  Make sure that it is fully  opened. Do not climb a closed stepladder.  Make sure that both sides of the stepladder are locked into place.  Ask someone to hold it for you, if possible.  Clearly mark and make sure that you can see:  Any grab bars or handrails.  First and last steps.  Where the edge of each step is.  Use tools that help you move around (mobility aids) if they are needed. These include:  Canes.  Walkers.  Scooters.  Crutches.  Turn on the lights when you go into a dark area. Replace any light bulbs as soon as they burn out.  Set up your furniture so you have a clear path. Avoid moving your furniture around.  If any of your floors are uneven, fix them.  If there are any pets around you, be aware of where they are.  Review your medicines with your doctor. Some medicines can make you feel dizzy. This can increase your chance of falling. Ask your doctor what other things that you can do to help prevent falls. This information is not intended to replace advice given to you by your health care provider. Make sure you discuss any questions you have with your health care provider. Document Released: 07/30/2009 Document Revised: 03/10/2016 Document Reviewed: 11/07/2014 Elsevier Interactive Patient Education  2017 [employer].

## 2019-09-18 LAB — POCT UA - MICROALBUMIN
Albumin/Creatinine Ratio, Urine, POC: <30
Creatinine, POC: 100 mg/dL
Microalbumin Ur, POC: 30 mg/L

## 2019-09-18 LAB — POCT URINALYSIS DIPSTICK
Bilirubin, UA: NEGATIVE
Glucose, UA: NEGATIVE
Ketones, UA: NEGATIVE
Leukocytes, UA: NEGATIVE
Nitrite, UA: NEGATIVE
Protein, UA: NEGATIVE
Spec Grav, UA: 1.02 (ref 1.010–1.025)
Urobilinogen, UA: 0.2 E.U./dL
pH, UA: 6.5 (ref 5.0–8.0)

## 2019-09-18 NOTE — Addendum Note (Signed)
Addended by: Kellie Simmering on: 09/18/2019 07:59 AM   Modules accepted: Orders

## 2019-09-22 NOTE — Progress Notes (Signed)
This visit occurred during the SARS-CoV-2 public health emergency.  Safety protocols were in place, including screening questions prior to the visit, additional usage of staff PPE, and extensive cleaning of exam room while observing appropriate contact time as indicated for disinfecting solutions.  Subjective:     Patient ID: Brianna [name] , female    DOB: Apr 28, 1933 , 85 y.o.   MRN: [id number]   Chief Complaint  Patient presents with  . Hypothyroidism  . Hypertension    HPI  She presents today for thyroid check. She reports compliance with meds. She brought her medications in for review.   Hypertension This is a chronic problem. The current episode started more than 1 year ago. The problem is controlled. Pertinent negatives include no blurred vision, chest pain, palpitations or shortness of breath. Risk factors for coronary artery disease include diabetes mellitus and dyslipidemia. Past treatments include calcium channel blockers and diuretics. The current treatment provides moderate improvement. Compliance problems include exercise.      Past Medical History:  Diagnosis Date  . Headache 12/24/2013  . Headache(784.0)   . Hypertension    pcp  [facility]  . Hypothyroid   . MGUS (monoclonal gammopathy of unknown significance) 12/10/2013     Family History  Problem Relation Age of Onset  . Cancer Maternal Grandfather        stomach cancer  . Cancer Paternal Grandfather        stomach cancer  . Healthy Mother   . Dementia Mother        at 51-99 y.o  . Healthy Father      Current Outpatient Medications:  .  amLODipine (NORVASC) 5 MG tablet, TAKE 1 TABLET BY MOUTH EVERY DAY, Disp: 90 tablet, Rfl: 1 .  aspirin 81 MG tablet, Take 81 mg by mouth daily., Disp: , Rfl:  .  Cholecalciferol (VITAMIN D3) 125 MCG (5000 UT) CAPS, Take 5,000 Units by mouth daily. , Disp: , Rfl:  .  doxycycline (VIBRAMYCIN) 100 MG capsule, Take 1 capsule (100 mg total) by mouth 2 (two) times daily.  (Patient not taking: Reported on 09/17/2019), Disp: 14 capsule, Rfl: 0 .  fish oil-omega-3 fatty acids 1000 MG capsule, Take 1 g by mouth daily. , Disp: , Rfl:  .  furosemide (LASIX) 40 MG tablet, Take 1 tablet (40 mg total) by mouth daily as needed for fluid or edema. She forgets to take it, Disp: 90 tablet, Rfl: 0 .  Melatonin 5 MG TABS, Take 1 tablet by mouth at bedtime as needed., Disp: , Rfl:  .  Multiple Vitamin (MULTIVITAMIN WITH MINERALS) TABS, Take 1 tablet by mouth daily. , Disp: , Rfl:  .  senna (SENOKOT) 8.6 MG TABS tablet, Take 1 tablet by mouth daily as needed for mild constipation., Disp: , Rfl:  .  SYNTHROID 125 MCG tablet, Monday - Saturday, Disp: 90 tablet, Rfl: 90   Allergies  Allergen Reactions  . Iodine Other (See Comments)    Reaction unknown  . Sulfa Antibiotics Other (See Comments)    Childhood reaction     Review of Systems  Constitutional: Negative.   Eyes: Negative for blurred vision.  Respiratory: Negative.  Negative for shortness of breath.   Cardiovascular: Negative.  Negative for chest pain and palpitations.  Gastrointestinal: Negative.   Neurological: Negative.   Psychiatric/Behavioral: Negative.      Today's Vitals   09/17/19 1214  BP: 134/70  Pulse: 85  Temp: 97.9 F (36.6 C)  TempSrc: Oral  Weight: 148 lb 1.6 oz (67.2 kg)  Height: 5' 3.6" (1.615 m)  PainSc: 7    Body mass index is 25.74 kg/m.   Objective:  Physical Exam Vitals signs and nursing note reviewed.  Constitutional:      Appearance: Normal appearance.  HENT:     Head: Normocephalic and atraumatic.  Cardiovascular:     Rate and Rhythm: Normal rate and regular rhythm.     Heart sounds: Normal heart sounds.  Pulmonary:     Effort: Pulmonary effort is normal.     Breath sounds: Normal breath sounds.  Musculoskeletal:     Right lower leg: 1+ Pitting Edema present.     Left lower leg: 1+ Pitting Edema present.  Skin:    General: Skin is warm.  Neurological:     General:  No focal deficit present.     Mental Status: She is alert.  Psychiatric:        Mood and Affect: Mood normal.        Behavior: Behavior normal.         Assessment And Plan:     1. Hypertensive nephropathy  Chronic, controlled. She will continue with current meds. She is encouraged to avoid adding salt to her foods.   - CMP14+EGFR  2. Chronic renal disease, stage II  Chronic, I will check GFR, Cr today.   3. Primary hypothyroidism  I will check thyroid panel and adjust meds as needed.  - TSH  4. Lower extremity edema  She is advised to elevate her legs while seated. Also encouraged to wear compression hose and to limit her salt intake.   5. Overweight with body mass index (BMI) of 25 to 25.9 in adult  Her weight is stable, no need for weight loss at this time.    Maximino Greenland, MD    THE PATIENT IS ENCOURAGED TO PRACTICE SOCIAL DISTANCING DUE TO THE COVID-19 PANDEMIC.

## 2019-09-23 ENCOUNTER — Telehealth: Payer: Self-pay

## 2019-09-23 NOTE — Telephone Encounter (Signed)
The pt was asked when she could come back to do the lab work that wasn't collected at her visit.  The pt said that she would be back this Wednesday.

## 2019-09-25 ENCOUNTER — Telehealth: Payer: Self-pay | Admitting: Pharmacist

## 2019-09-25 DIAGNOSIS — E039 Hypothyroidism, unspecified: Secondary | ICD-10-CM | POA: Diagnosis not present

## 2019-09-25 DIAGNOSIS — I10 Essential (primary) hypertension: Secondary | ICD-10-CM | POA: Diagnosis not present

## 2019-09-26 LAB — CMP14+EGFR
ALT: 8 IU/L (ref 0–32)
AST: 13 IU/L (ref 0–40)
Albumin/Globulin Ratio: 1.2 (ref 1.2–2.2)
Albumin: 4.1 g/dL (ref 3.6–4.6)
Alkaline Phosphatase: 74 IU/L (ref 39–117)
BUN/Creatinine Ratio: 14 (ref 12–28)
BUN: 15 mg/dL (ref 8–27)
Bilirubin Total: <0.2 mg/dL (ref 0.0–1.2)
CO2: 25 mmol/L (ref 20–29)
Calcium: 9.1 mg/dL (ref 8.7–10.3)
Chloride: 103 mmol/L (ref 96–106)
Creatinine, Ser: 1.05 mg/dL — ABNORMAL HIGH (ref 0.57–1.00)
GFR calc Af Amer: 56 mL/min/{1.73_m2} — ABNORMAL LOW (ref >59)
GFR calc non Af Amer: 49 mL/min/{1.73_m2} — ABNORMAL LOW (ref >59)
Globulin, Total: 3.3 g/dL (ref 1.5–4.5)
Glucose: 86 mg/dL (ref 65–99)
Potassium: 4.7 mmol/L (ref 3.5–5.2)
Sodium: 142 mmol/L (ref 134–144)
Total Protein: 7.4 g/dL (ref 6.0–8.5)

## 2019-09-26 LAB — TSH: TSH: 14.9 u[IU]/mL — ABNORMAL HIGH (ref 0.450–4.500)

## 2019-09-27 ENCOUNTER — Other Ambulatory Visit: Payer: Self-pay

## 2019-09-27 MED ORDER — SYNTHROID 125 MCG PO TABS: ORAL_TABLET | ORAL | 1 refills | Status: DC

## 2019-09-30 ENCOUNTER — Ambulatory Visit (INDEPENDENT_AMBULATORY_CARE_PROVIDER_SITE_OTHER): Payer: Medicare HMO | Admitting: Pharmacist

## 2019-09-30 DIAGNOSIS — E039 Hypothyroidism, unspecified: Secondary | ICD-10-CM

## 2019-09-30 DIAGNOSIS — F0390 Unspecified dementia without behavioral disturbance: Secondary | ICD-10-CM | POA: Diagnosis not present

## 2019-09-30 NOTE — Progress Notes (Signed)
Chronic Care Management    Visit Note  09/30/2019 Name: Brianna [name] MRN: [id number] DOB: 25-Jun-1933  Referred by: [name], MD Reason for referral : Chronic Care Management   Brianna [name] is a 85 y.o. year old female who is a primary care patient of [name], MD. The CCM team was consulted for assistance with chronic disease management and care coordination needs related to Dementia and hypothyroidism  Review of patient status, including review of consultants reports, relevant laboratory and other test results, and collaboration with appropriate care team members and the patient's provider was performed as part of comprehensive patient evaluation and provision of chronic care management services.    I spoke with Brianna [name] by telephone today  Medications: Outpatient Encounter Medications as of 09/30/2019  Medication Sig  . amLODipine (NORVASC) 5 MG tablet TAKE 1 TABLET BY MOUTH EVERY DAY  . aspirin 81 MG tablet Take 81 mg by mouth daily.  . Cholecalciferol (VITAMIN D3) 125 MCG (5000 UT) CAPS Take 5,000 Units by mouth daily.   . fish oil-omega-3 fatty acids 1000 MG capsule Take 1 g by mouth daily.   . furosemide (LASIX) 40 MG tablet Take 1 tablet (40 mg total) by mouth daily as needed for fluid or edema. She forgets to take it  . Melatonin 5 MG TABS Take 1 tablet by mouth at bedtime as needed.  . Multiple Vitamin (MULTIVITAMIN WITH MINERALS) TABS Take 1 tablet by mouth daily.   [name] senna (SENOKOT) 8.6 MG TABS tablet Take 1 tablet by mouth daily as needed for mild constipation.  [name] SYNTHROID 125 MCG tablet Take 1 tablet by mouth Monday - Sunday  . [DISCONTINUED] doxycycline (VIBRAMYCIN) 100 MG capsule Take 1 capsule (100 mg total) by mouth 2 (two) times daily. (Patient not taking: Reported on 09/17/2019)   No facility-administered encounter medications on file as of 09/30/2019.     Objective:   Goals Addressed            This Visit's Progress     Patient  Stated   . "I get confused on how to take my thyroid medication" (pt-stated)       Current Barriers:  [name] Knowledge Deficit related to Hypothyroidism  . Impaired memory  Nurse Case Manager Clinical Goal(s):  [name] Over the next 90 days, patients thyroid levels will be within normal range. Goal Not Met . 03/29/19: Goal reestablished-Over the next 30 days, patient will report taking her Synthroid exactly as prescribed w/o missed or delayed doses.  . 09/30/19: Goal reestablished-Over the next 90 days, patient's thyroid level will be within normal range.  Goal updated due to new labwork  CCM PharmD Interventions:  09/30/19 call completed with patient  . Patient states she is taking her Synthroid now everyday.  This is a new change that PCP made last week based on elevated TSH ~15 . Patient states she uses pill box at home.  She may benefit from adherence packaging, but does not wish to pursue. . Patient complains of edema in lower ankles likely due to venous stasis per PCP.   [name] Encouraged Synthroid dosing in the AM before breakfast DAILY.  Patient notified that refill was called in [facility] pick up (90 DS) Patient verbalizes understanding. . Encouraged patient to have family help her with her medications.  She appears compliant with fill history after review of patient's insurance claims.   Patient Self Care Activities:   Verbalizes understanding of the education/information provided today  Self  administers medications as prescribed . Drives self and attends all scheduled provider appointments . Calls pharmacy for medication refills . Performs ADL's independently . Calls provider office for new concerns or questions   Please see past updates related to this goal by clicking on the "Past Updates" button in the selected goal           Plan:   The care management team will reach out to the patient again over the next 30 days.   Provider Signature Regina Eck, PharmD,  BCPS Clinical Pharmacist, [facility] [place]: [phone]

## 2019-09-30 NOTE — Patient Instructions (Signed)
Visit Information  Goals Addressed            This Visit's Progress     Patient Stated   . "I get confused on how to take my thyroid medication" (pt-stated)       Current Barriers:  Brianna [name] Knowledge Deficit related to Hypothyroidism  . Impaired memory  Nurse Case Manager Clinical Goal(s):  Brianna [name] Over the next 90 days, patients thyroid levels will be within normal range. Goal Not Met . 03/29/19: Goal reestablished-Over the next 30 days, patient will report taking her Synthroid exactly as prescribed w/o missed or delayed doses.  . 09/30/19: Over the next 90 days, patient's thyroid level will be within normal range.  Goal updated due to new labwork  CCM RN CM Interventions:  07/31/19 call completed with patient   . Assessed for adherence to patient following her medication regimen for taking Synthroid as directed by [name] Cancer  (pt states she is adhering and is using her pill box to help her remember to take meds, she is taking thyroid medication in the am 1-2 hours before breakfast) during the call patient realized she has missed taking her synthroid this morning, she is eating breakfast and will wait 2 hours afterwards before taking this dose  Reinforced and discussed most recent med regimen recommended by [name] Cancer; patient reports taking Synthroid Monday through Saturday none on Sunday as directed - patient uses a pill box and has established a daily routine   Discussed patient completed her SNV for medication management - reviewed pill package options with patient, she feels very comfortable with her current process and is using a pill box, pt declines wanting to change her established routine at this time . Discussed plans with patient for ongoing care management follow up and provided patient with direct contact information for care management team  CCM PharmD Interventions:  09/30/19 call completed with patient  . Patient states she is taking her Synthroid now everyday.  This is a new  change that PCP made last week based on elevated TSH ~15 . Patient states she uses pill box at home.  She may benefit from adherence packaging, but does not wish to pursue. . Patient complains of edema in lower ankles likely due to venous stasis per PCP.   Brianna [name] Encouraged Synthroid dosing in the AM before breakfast DAILY.  Patient notified that [facility] ([facility]) Patient verbalizes understanding. . Encouraged patient to have family help her with her medications.  She appears compliant with fill history after review of patient's insurance claims.   Patient Self Care Activities:   Verbalizes understanding of the education/information provided today  Self administers medications as prescribed . Drives self and attends all scheduled provider appointments . Calls pharmacy for medication refills . Performs ADL's independently . Calls provider office for new concerns or questions   Please see past updates related to this goal by clicking on the "Past Updates" button in the selected goal          The patient verbalized understanding of instructions provided today and declined a print copy of patient instruction materials.   The care management team will reach out to the patient again over the next 30 days.   SIGNATURE Regina Eck, PharmD, BCPS Clinical Pharmacist, [facility] [place]: [phone]

## 2019-10-04 ENCOUNTER — Telehealth: Payer: Self-pay

## 2019-10-04 ENCOUNTER — Ambulatory Visit: Payer: Self-pay

## 2019-10-04 ENCOUNTER — Other Ambulatory Visit: Payer: Self-pay

## 2019-10-04 DIAGNOSIS — H9190 Unspecified hearing loss, unspecified ear: Secondary | ICD-10-CM

## 2019-10-04 DIAGNOSIS — R413 Other amnesia: Secondary | ICD-10-CM

## 2019-10-04 DIAGNOSIS — E039 Hypothyroidism, unspecified: Secondary | ICD-10-CM

## 2019-10-04 DIAGNOSIS — I10 Essential (primary) hypertension: Secondary | ICD-10-CM

## 2019-10-07 NOTE — Chronic Care Management (AMB) (Signed)
Chronic Care Management   Follow Up Note   10/04/2019 Name: Brianna [name] MRN: [id number] DOB: August 20, 1933  Referred by: [name], MD Reason for referral : Chronic Care Management (CCM RNCM Telephone Follow up )   Brianna [name] is a 85 y.o. year old female who is a primary care patient of [name], MD. The CCM team was consulted for assistance with chronic disease management and care coordination needs.    Review of patient status, including review of consultants reports, relevant laboratory and other test results, and collaboration with appropriate care team members and the patient's provider was performed as part of comprehensive patient evaluation and provision of chronic care management services.    SDOH (Social Determinants of Health) screening performed today: None. See Care Plan for related entries.   Placed outbound call to [name] for a CCM RN CM follow up and her care plan was updated.     Outpatient Encounter Medications as of 10/04/2019  Medication Sig  . amLODipine (NORVASC) 5 MG tablet TAKE 1 TABLET BY MOUTH EVERY DAY  . aspirin 81 MG tablet Take 81 mg by mouth daily.  . Cholecalciferol (VITAMIN D3) 125 MCG (5000 UT) CAPS Take 5,000 Units by mouth daily.   . fish oil-omega-3 fatty acids 1000 MG capsule Take 1 g by mouth daily.   . furosemide (LASIX) 40 MG tablet Take 1 tablet (40 mg total) by mouth daily as needed for fluid or edema. She forgets to take it  . Melatonin 5 MG TABS Take 1 tablet by mouth at bedtime as needed.  . Multiple Vitamin (MULTIVITAMIN WITH MINERALS) TABS Take 1 tablet by mouth daily.   Brianna [name] senna (SENOKOT) 8.6 MG TABS tablet Take 1 tablet by mouth daily as needed for mild constipation.  Brianna [name] SYNTHROID 125 MCG tablet Take 1 tablet by mouth Monday - Sunday   No facility-administered encounter medications on file as of 10/04/2019.     Goals Addressed      Patient Stated   . "I get confused on how to take my thyroid medication"  (pt-stated)   Not on track    Current Barriers:  Brianna [name] Knowledge Deficit related to Hypothyroidism  . Chronic Disease Management support and education needs related to Hypothyroidism, HTN and Memory loss  Nurse Case Manager Clinical Goal(s):  Brianna [name] Over the next 90 days, patients thyroid levels will be within normal range. Goal Not Met . 03/29/19: Goal reestablished-Over the next 30 days, patient will report taking her Synthroid exactly as prescribed w/o missed or delayed doses. Goal Not Met - patient's follow up TSH support patient is not taking Synthroid as prescribed . 09/30/19: Over the next 90 days, patient's thyroid level will be within normal range.  Goal updated due to new labwork  CCM RN CM Interventions:  10/04/19 call completed with patient   . Assessed for adherence to patient following her medication regimen for taking Synthroid as directed by [name] Cancer  (pt states she is adhering and is using her pill box to help her remember to take meds, she is taking thyroid medication in the am 1-2 hours before breakfast) Reviewed most recent TSH lab results obtained on 09/25/19;  TSH 14.900 (normal range - 0.450-4.500 ulU mL ) Reviewed and discussed per [name] Cancer, [name] should be taking her Synthroid everyday of the week first thing in the am about 1 hour before eating breakfast and or taking other medications, emphasized patient will no longer skip Saturday or Sunday -  pt verbalizes understanding . Discussed plans with patient for ongoing care management follow up and provided patient with direct contact information for care management team  Patient Self Care Activities:   Verbalizes understanding of the education/information provided today  Self administers medications as prescribed . Drives self and attends all scheduled provider appointments . Calls pharmacy for medication refills . Performs ADL's independently . Calls provider office for new concerns or questions  Please see past  updates related to this goal by clicking on the "Past Updates" button in the selected goal      . "My legs are still swollen & red" (pt-stated)   Not on track    Current Barriers:  Brianna [name] Knowledge Deficits related to diagnosis and treatment of bilateral lower leg edema . Chronic Disease Management support and education needs related to Hypothyroidism, HTN and Memory loss . Suspicious for Venous Insufficiency   Nurse Case Manager Clinical Goal(s):  Brianna [name] Over the next 30 days, patient will have a new appointment scheduled with [facility] to evaluate bilateral lower extremity edema . Over the next 60 days, patient will work with the CCM team and PCP to address needs related to education and support for diagnosis/treatment of bilateral lower leg edema and erythema  CCM RN CM Interventions:  10/04/19 call completed with patient   . Evaluation of current treatment plan related to bilateral lower leg edema and erythema and patient's adherence to plan as established by provider. Nash Dimmer with [facility] via telephone outreach, spoke with Mardene Celeste regarding follow up on the ordered LE Venous US to rule out DVT; Mardene Celeste confirmed the order and referral have been received and the test is ready to be scheduled; discussed the patient prefers if she is unable to be reached by phone with first attempt to try another call to her right away as she screens her calls; discussed the imaging department will reach out to [name] to get this scheduled ASAP; discussed the referral states this is a routine test and is not ordered STAT . Discussed plans with patient for ongoing care management follow up and provided patient with direct contact information for care management team  Patient Self Care Activities:  . Self administers medications as prescribed . Attends all scheduled provider appointments . Performs ADL's independently . Performs IADL's independently . Calls provider office for new  concerns or questions  Please see past updates related to this goal by clicking on the "Past Updates" button in the selected goal     . "My skin has been dry & itchy" (pt-stated)   On track    Current Barriers:  Brianna [name] Knowledge Deficits related to treatment management for impaired skin integrity . Chronic Disease Management support and education needs related to Hypothyroidism, HTN and memory loss  Nurse Case Manager Clinical Goal(s):  Brianna [name] Over the next 30 days, patient will report her skin is intact and without cellulitis and or open wounds Goal Met . Over the next 90 days, patient will verbalize having an increased understanding of how to identify early s/s of impaired skin integrity and will verbalize increased understanding about how to properly care for her skin to decrease the risk for having open wounds or other complications that may occur from impaired skin integrity  CCM RN CM Interventions:  10/04/19 call completed with patient   . Evaluation of current treatment plan related to Cellulitis of right lower leg and patient's adherence to plan as established by provider. . Reinforced patient to keep her  skin clean and dry and to continue to follow PCP recommendations for Self Care - currently patient denies having impaired skin integrity, discussed she continues to have edema . Reviewed and discussed patient is taking Furosemide 40 mg as needed; discussed patient feels she needs to take this medication daily and plans to load her pill box with a daily dose . Encouraged patient to elevate her lower extremities above heart level as tolerated while resting and to continue to wear her stockings daily as directed by [name] Cancer to help promote circulation and decrease swelling . Educated patient with rationale regarding of importance of complying with wearing the TED/ACE hose to help reduce the risk of DVT and or other complications related to lower extremity edema . Reviewed and noted patient  completed her scheduled Dermatology appointment with Dr. Martin Majestic on 08/26/19 as scheduled - unable to review dx and or plan of care for recommendations of skin therapy . Discussed plans with patient for ongoing care management follow up and provided patient with direct contact information for care management team  Patient Self Care Activities:  . Self administers medications as prescribed . Attends all scheduled provider appointments . Calls pharmacy for medication refills . Attends church or other social activities . Performs ADL's independently . Performs IADL's independently . Calls provider office for new concerns or questions  Please see past updates related to this goal by clicking on the "Past Updates" button in the selected goal         Telephone follow up appointment with care management team member scheduled for: 11/20/19  Barb Merino, RN, BSN, CCM Care Management Coordinator [facility]  Direct Phone: [phone]

## 2019-10-07 NOTE — Patient Instructions (Signed)
Visit Information  Goals Addressed      Patient Stated   . "I get confused on how to take my thyroid medication" (pt-stated)   Not on track    Current Barriers:  [name] Knowledge Deficit related to Hypothyroidism  . Chronic Disease Management support and education needs related to Hypothyroidism, HTN and Memory loss  Nurse Case Manager Clinical Goal(s):  [name] Over the next 90 days, patients thyroid levels will be within normal range. Goal Not Met . 03/29/19: Goal reestablished-Over the next 30 days, patient will report taking her Synthroid exactly as prescribed w/o missed or delayed doses. Goal Not Met - patient's follow up TSH support patient is not taking Synthroid as prescribed . 09/30/19: Over the next 90 days, patient's thyroid level will be within normal range.  Goal updated due to new labwork  CCM RN CM Interventions:  10/04/19 call completed with patient   . Assessed for adherence to patient following her medication regimen for taking Synthroid as directed by [name] Cancer  (pt states she is adhering and is using her pill box to help her remember to take meds, she is taking thyroid medication in the am 1-2 hours before breakfast) Reviewed most recent TSH lab results obtained on 09/25/19;  TSH 14.900 (normal range - 0.450-4.500 ulU mL ) Reviewed and discussed per [name] Cancer, Ms. Reichardt should be taking her Synthroid everyday of the week first thing in the am about 1 hour before eating breakfast and or taking other medications, emphasized patient will no longer skip Saturday or Sunday - pt verbalizes understanding . Discussed plans with patient for ongoing care management follow up and provided patient with direct contact information for care management team  Patient Self Care Activities:   Verbalizes understanding of the education/information provided today  Self administers medications as prescribed . Drives self and attends all scheduled provider appointments . Calls pharmacy for  medication refills . Performs ADL's independently . Calls provider office for new concerns or questions  Please see past updates related to this goal by clicking on the "Past Updates" button in the selected goal       . "My legs are still swollen & red" (pt-stated)   Not on track    Current Barriers:  [name] Knowledge Deficits related to diagnosis and treatment of bilateral lower leg edema . Chronic Disease Management support and education needs related to Hypothyroidism, HTN and Memory loss . Suspicious for Venous Insufficiency   Nurse Case Manager Clinical Goal(s):  [name] Over the next 30 days, patient will have a new appointment scheduled with [facility] to evaluate bilateral lower extremity edema . Over the next 60 days, patient will work with the CCM team and PCP to address needs related to education and support for diagnosis/treatment of bilateral lower leg edema and erythema  CCM RN CM Interventions:  10/04/19 call completed with patient   . Evaluation of current treatment plan related to bilateral lower leg edema and erythema and patient's adherence to plan as established by provider. Nash Dimmer with [facility] via telephone outreach, spoke with Mardene Celeste regarding follow up on the ordered LE Venous US to rule out DVT; Mardene Celeste confirmed the order and referral have been received and the test is ready to be scheduled; discussed the patient prefers if she is unable to be reached by phone with first attempt to try another call to her right away as she screens her calls; discussed the imaging department will reach out to Ms. Dross to get  this scheduled ASAP; discussed the referral states this is a routine test and is not ordered STAT . Discussed plans with patient for ongoing care management follow up and provided patient with direct contact information for care management team  Patient Self Care Activities:  . Self administers medications as prescribed . Attends all  scheduled provider appointments . Performs ADL's independently . Performs IADL's independently . Calls provider office for new concerns or questions  Please see past updates related to this goal by clicking on the "Past Updates" button in the selected goal      . "My skin has been dry & itchy" (pt-stated)   On track    Current Barriers:  [name] Knowledge Deficits related to treatment management for impaired skin integrity . Chronic Disease Management support and education needs related to Hypothyroidism, HTN and memory loss  Nurse Case Manager Clinical Goal(s):  [name] Over the next 30 days, patient will report her skin is intact and without cellulitis and or open wounds Goal Met . Over the next 90 days, patient will verbalize having an increased understanding of how to identify early s/s of impaired skin integrity and will verbalize increased understanding about how to properly care for her skin to decrease the risk for having open wounds or other complications that may occur from impaired skin integrity  CCM RN CM Interventions:  10/04/19 call completed with patient   . Evaluation of current treatment plan related to Cellulitis of right lower leg and patient's adherence to plan as established by provider. . Reinforced patient to keep her skin clean and dry and to continue to follow PCP recommendations for Self Care - currently patient denies having impaired skin integrity, discussed she continues to have edema . Reviewed and discussed patient is taking Furosemide 40 mg as needed; discussed patient feels she needs to take this medication daily and plans to load her pill box with a daily dose . Encouraged patient to elevate her lower extremities above heart level as tolerated while resting and to continue to wear her stockings daily as directed by [name] Cancer to help promote circulation and decrease swelling . Educated patient with rationale regarding of importance of complying with wearing the TED/ACE  hose to help reduce the risk of DVT and or other complications related to lower extremity edema . Reviewed and noted patient completed her scheduled Dermatology appointment with Dr. Martin Majestic on 08/26/19 as scheduled - unable to review dx and or plan of care for recommendations of skin therapy . Discussed plans with patient for ongoing care management follow up and provided patient with direct contact information for care management team  Patient Self Care Activities:  . Self administers medications as prescribed . Attends all scheduled provider appointments . Calls pharmacy for medication refills . Attends church or other social activities . Performs ADL's independently . Performs IADL's independently . Calls provider office for new concerns or questions   Please see past updates related to this goal by clicking on the "Past Updates" button in the selected goal         The patient verbalized understanding of instructions provided today and declined a print copy of patient instruction materials.   Telephone follow up appointment with care management team member scheduled for:  11/20/19  Barb Merino, RN, BSN, CCM Care Management Coordinator [facility] [facility]  Direct Phone: [phone]

## 2019-10-16 ENCOUNTER — Telehealth: Payer: Self-pay

## 2019-10-17 ENCOUNTER — Other Ambulatory Visit: Payer: Self-pay | Admitting: Nurse Practitioner

## 2019-10-17 DIAGNOSIS — R600 Localized edema: Secondary | ICD-10-CM

## 2019-10-22 ENCOUNTER — Other Ambulatory Visit: Payer: Self-pay

## 2019-10-22 ENCOUNTER — Other Ambulatory Visit: Payer: Self-pay | Admitting: Internal Medicine

## 2019-10-22 ENCOUNTER — Encounter: Payer: Self-pay | Admitting: Internal Medicine

## 2019-10-22 ENCOUNTER — Telehealth: Payer: Self-pay

## 2019-10-22 ENCOUNTER — Ambulatory Visit (INDEPENDENT_AMBULATORY_CARE_PROVIDER_SITE_OTHER): Payer: Medicare HMO | Admitting: Internal Medicine

## 2019-10-22 VITALS — BP 126/84 | HR 72 | Temp 98.3°F | Ht 63.6 in | Wt 153.8 lb

## 2019-10-22 DIAGNOSIS — D472 Monoclonal gammopathy: Secondary | ICD-10-CM

## 2019-10-22 DIAGNOSIS — I10 Essential (primary) hypertension: Secondary | ICD-10-CM

## 2019-10-22 DIAGNOSIS — R0601 Orthopnea: Secondary | ICD-10-CM | POA: Diagnosis not present

## 2019-10-22 DIAGNOSIS — R061 Stridor: Secondary | ICD-10-CM | POA: Diagnosis not present

## 2019-10-22 DIAGNOSIS — R0602 Shortness of breath: Secondary | ICD-10-CM | POA: Diagnosis not present

## 2019-10-22 DIAGNOSIS — Z79899 Other long term (current) drug therapy: Secondary | ICD-10-CM | POA: Diagnosis not present

## 2019-10-22 DIAGNOSIS — E039 Hypothyroidism, unspecified: Secondary | ICD-10-CM

## 2019-10-22 NOTE — Telephone Encounter (Signed)
[facility] called stating they got an order for her to be seen for venous insuffiencey and  The pt told them that she doesn't want to be seen. Minette Brine FNP-BC has been made aware. YRL,RMA

## 2019-10-23 LAB — BRAIN NATRIURETIC PEPTIDE: BNP: 7 pg/mL (ref 0.0–100.0)

## 2019-10-24 ENCOUNTER — Telehealth: Payer: Self-pay

## 2019-10-24 LAB — PROTEIN ELECTROPHORESIS, SERUM
A/G Ratio: 1.2 (ref 0.7–1.7)
Albumin ELP: 3.8 g/dL (ref 2.9–4.4)
Alpha 1: 0.1 g/dL (ref 0.0–0.4)
Alpha 2: 0.7 g/dL (ref 0.4–1.0)
Beta: 0.9 g/dL (ref 0.7–1.3)
Gamma Globulin: 1.6 g/dL (ref 0.4–1.8)
Globulin, Total: 3.3 g/dL (ref 2.2–3.9)
M-Spike, %: 0.7 g/dL — ABNORMAL HIGH
Total Protein: 7.1 g/dL (ref 6.0–8.5)

## 2019-10-24 NOTE — Telephone Encounter (Signed)
Called to give pt lab results but pt unable to take due to being in the bathroom

## 2019-11-01 ENCOUNTER — Other Ambulatory Visit: Payer: Self-pay | Admitting: Internal Medicine

## 2019-11-01 DIAGNOSIS — D472 Monoclonal gammopathy: Secondary | ICD-10-CM

## 2019-11-04 DIAGNOSIS — H40012 Open angle with borderline findings, low risk, left eye: Secondary | ICD-10-CM | POA: Diagnosis not present

## 2019-11-05 ENCOUNTER — Telehealth: Payer: Self-pay | Admitting: Hematology and Oncology

## 2019-11-05 NOTE — Telephone Encounter (Signed)
Received a new hem referral from [name] Cancer for MGUS. Brianna [name] has been cld and scheduled to see Dr. Lorenso Courier on 1/27 at 2pm. Pt aware to arrive 15 minutes early.

## 2019-11-09 NOTE — Progress Notes (Signed)
This visit occurred during the SARS-CoV-2 public health emergency.  Safety protocols were in place, including screening questions prior to the visit, additional usage of staff PPE, and extensive cleaning of exam room while observing appropriate contact time as indicated for disinfecting solutions.  Subjective:     Patient ID: Brianna [name] , female    DOB: 1933/02/08 , 86 y.o.   MRN: [id number]   Chief Complaint  Patient presents with  . Hypothyroidism    HPI  She is here today for thyroid check. She reports compliance with meds. She did bring in her medications today.   Thyroid Problem Presents for follow-up visit. Patient reports no cold intolerance, constipation, heat intolerance, hoarse voice, tremors or visual change.     Past Medical History:  Diagnosis Date  . Headache 12/24/2013  . Headache(784.0)   . Hypertension    pcp  [facility]  . Hypothyroid   . MGUS (monoclonal gammopathy of unknown significance) 12/10/2013     Family History  Problem Relation Age of Onset  . Cancer Maternal Grandfather        stomach cancer  . Cancer Paternal Grandfather        stomach cancer  . Healthy Mother   . Dementia Mother        at 76-99 y.o  . Healthy Father      Current Outpatient Medications:  .  amLODipine (NORVASC) 5 MG tablet, TAKE 1 TABLET BY MOUTH EVERY DAY, Disp: 90 tablet, Rfl: 1 .  aspirin 81 MG tablet, Take 81 mg by mouth daily., Disp: , Rfl:  .  Cholecalciferol (VITAMIN D3) 125 MCG (5000 UT) CAPS, Take 5,000 Units by mouth daily. , Disp: , Rfl:  .  fish oil-omega-3 fatty acids 1000 MG capsule, Take 1 g by mouth daily. , Disp: , Rfl:  .  furosemide (LASIX) 40 MG tablet, Take 1 tablet (40 mg total) by mouth daily as needed for fluid or edema. She forgets to take it, Disp: 90 tablet, Rfl: 0 .  Melatonin 5 MG TABS, Take 1 tablet by mouth at bedtime as needed., Disp: , Rfl:  .  Multiple Vitamin (MULTIVITAMIN WITH MINERALS) TABS, Take 1 tablet by mouth daily. ,  Disp: , Rfl:  .  senna (SENOKOT) 8.6 MG TABS tablet, Take 1 tablet by mouth daily as needed for mild constipation., Disp: , Rfl:  .  SYNTHROID 125 MCG tablet, Take 1 tablet by mouth Monday - Sunday, Disp: 90 tablet, Rfl: 1   Allergies  Allergen Reactions  . Iodine Other (See Comments)    Reaction unknown  . Sulfa Antibiotics Other (See Comments)    Childhood reaction     Review of Systems  Constitutional: Negative.   HENT: Negative for hoarse voice.   Respiratory: Negative.   Cardiovascular: Negative.        She reports she has to sit up when going to bed at night. She reports SOB when she lies down flat. This is not a new sx.   Gastrointestinal: Negative.  Negative for constipation.  Endocrine: Negative for cold intolerance and heat intolerance.  Neurological: Negative.  Negative for tremors.  Psychiatric/Behavioral: Negative.      Today's Vitals   10/22/19 1149  BP: 126/84  Pulse: 72  Temp: 98.3 F (36.8 C)  TempSrc: Oral  Weight: 153 lb 12.8 oz (69.8 kg)  Height: 5' 3.6" (1.615 m)  PainSc: 0-No pain   Body mass index is 26.73 kg/m.   Objective:  Physical Exam  Vitals and nursing note reviewed.  Constitutional:      Appearance: Normal appearance.  HENT:     Head: Normocephalic and atraumatic.  Cardiovascular:     Rate and Rhythm: Normal rate and regular rhythm.     Heart sounds: Normal heart sounds.  Pulmonary:     Effort: Pulmonary effort is normal.     Breath sounds: Normal breath sounds.  Musculoskeletal:     Right lower leg: Edema present.     Left lower leg: Edema present.  Skin:    General: Skin is warm.  Neurological:     General: No focal deficit present.     Mental Status: She is alert.  Psychiatric:        Mood and Affect: Mood normal.        Behavior: Behavior normal.         Assessment And Plan:     1. Primary hypothyroidism  Chronic. I will have her return later this month for labwork. I will adjust meds as needed .  2. Essential  hypertension  Chronic, well controlled. She will continue with current meds. She is encouraged to avoid adding salt to her foods.   3. Orthopnea  I will check BNP today. I will make further recommendations once her labs are reviewed. She is encouraged to limit her salt intake.   4. Monoclonal gammopathy of unknown significance (MGUS)  Chronic, I will check SPEP. Pt is not sure why she stopped going to hematologist. I will place referral if M-spike is positive. She is in agreement with her treatment plan.   - Protein electrophoresis, serum   Maximino Greenland, MD    THE PATIENT IS ENCOURAGED TO PRACTICE SOCIAL DISTANCING DUE TO THE COVID-19 PANDEMIC.

## 2019-11-12 NOTE — Progress Notes (Signed)
[place] [phone]   Fax:([phone] [place] NOTE  Patient Care Team: [name], MD as PCP - General (Internal Medicine) Heath Lark, MD as Consulting Physician (Hematology and Oncology) Daneen Schick as Social Worker [place], Claudette Stapler, RN as Case Manager Lavera Guise, [facility] (Pharmacist)  Hematological/Oncological History #Monoclonal Gammopathy of Undetermined Significance 1) 12/10/2013: M protein 0.86, kappa 2.32, lambda 1.4, K/L ratio 1.66 2) 07/15/2014: M protein 0.74, kappa 2.87, Lambda 1.06, K/L ratio: 2.71 3) 2016-2021: Lost to follow up with Dr. Alvy Bimler 4) 10/22/2019: M protein 0.7. No SFLC ordered. 5) 11/13/2019: establish care with Dr. Lorenso Courier   CHIEF COMPLAINTS/PURPOSE OF CONSULTATION:  Monoclonal Gammopathy of Undetermined Significance  HISTORY OF PRESENTING ILLNESS:  Brianna [name] 86 y.o. female with medical history significant for HTN, hypothyroidism who presents for evaluation of her longstanding MGUS.   On review of the previous records Brianna [name] established with Dr. Alvy Bimler on 12/10/2013.  At that time she was referred due to work-up for headaches and concern of giant cell arteritis.  Although no giant cell arteritis was found as part of her extensive work-up she did have an SPEP performed which came back with an M spike.  Due to this finding the patient was referred to Dr. Alvy Bimler for further evaluation and management.  The patient had an SPEP at that time which showed an M protein of 0.86 with serum free light chains showing a kappa of 2.3, lambda of 1.4, and a kappa lambda ratio of 1.66.  Further work-up including bone survey showed no evidence of lytic lesions.  UPEP at that time detected no monoclonal protein.  The patient subsequently followed up again 6 months later with repeat stable labs.  From 2016 until 2021 unfortunately the patient was lost to follow-up.  Most recently she had an SPEP ordered on 10/22/2019 which  showed an M protein of 0.7.  She was referred to hematology once again to continue the management and observation of her MGUS.  On exam today Brianna [name] notes that she feels well.  She reports that she is active and takes the bus downtown every day to go for a walk along several blocks.  She is otherwise remarkably healthy with only history of hypertension hypothyroidism.  She reports that she has had no symptoms as of late the route of the ordinary.  She denies any fatigue, bleeding, bruising, nausea, vomiting, diarrhea, or fevers chills or sweats.  She notes that she has good energy and is at her baseline level with no recent changes.  A full 10 point ROS is listed below.  MEDICAL HISTORY:  Past Medical History:  Diagnosis Date  . Headache 12/24/2013  . Headache(784.0)   . Hypertension    pcp  [facility]  . Hypothyroid   . MGUS (monoclonal gammopathy of unknown significance) 12/10/2013    SURGICAL HISTORY: Past Surgical History:  Procedure Laterality Date  . ABDOMINAL HYSTERECTOMY    . bladder tac    . BREAST BIOPSY Right 04/2017  . CATARACT EXTRACTION W/PHACO  08/22/2012   Procedure: CATARACT EXTRACTION PHACO AND INTRAOCULAR LENS PLACEMENT (IOC);  Surgeon: Adonis Brook, MD;  Location: [facility];  Service: Ophthalmology;  Laterality: Left;  . CATARACT EXTRACTION W/PHACO Right 02/13/2013   Procedure: CATARACT EXTRACTION PHACO AND INTRAOCULAR LENS PLACEMENT (IOC);  Surgeon: Adonis Brook, MD;  Location: [facility];  Service: Ophthalmology;  Laterality: Right;    SOCIAL HISTORY: Social History   Socioeconomic History  . Marital status:  Widowed    Spouse name: Not on file  . Number of children: 3  . Years of education: 17.5-18   . Highest education level: Bachelor's degree (e.g., BA, AB, BS)  Occupational History  . Occupation: retired  Tobacco Use  . Smoking status: Never Smoker  . Smokeless tobacco: Never Used  Substance and Sexual Activity  . Alcohol use: Yes    Comment: socially   . Drug use: No  . Sexual activity: Not Currently  Other Topics Concern  . Not on file  Social History Narrative   Lives at home. Her sons live with her.    Right handed   Social Determinants of Health   Financial Resource Strain: Low Risk   . Difficulty of Paying Living Expenses: Not hard at all  Food Insecurity: No Food Insecurity  . Worried About Charity fundraiser in the Last Year: Never true  . Ran Out of Food in the Last Year: Never true  Transportation Needs: No Transportation Needs  . Lack of Transportation (Medical): No  . Lack of Transportation (Non-Medical): No  Physical Activity: Sufficiently Active  . Days of Exercise per Week: 7 days  . Minutes of Exercise per Session: 40 min  Stress: No Stress Concern Present  . Feeling of Stress : Only a little  Social Connections: Somewhat Isolated  . Frequency of Communication with Friends and Family: More than three times a week  . Frequency of Social Gatherings with Friends and Family: More than three times a week  . Attends Religious Services: Never  . Active Member of Clubs or Organizations: Yes  . Attends Archivist Meetings: More than 4 times per year  . Marital Status: Widowed  Intimate Partner Violence: Not At Risk  . Fear of Current or Ex-Partner: No  . Emotionally Abused: No  . Physically Abused: No  . Sexually Abused: No    FAMILY HISTORY: Family History  Problem Relation Age of Onset  . Cancer Maternal Grandfather        stomach cancer  . Cancer Paternal Grandfather        stomach cancer  . Healthy Mother   . Dementia Mother        at 64-99 y.o  . Healthy Father     ALLERGIES:  is allergic to iodine and sulfa antibiotics.  MEDICATIONS:  Current Outpatient Medications  Medication Sig Dispense Refill  . amLODipine (NORVASC) 5 MG tablet TAKE 1 TABLET BY MOUTH EVERY DAY 90 tablet 1  . aspirin 81 MG tablet Take 81 mg by mouth daily.    . Cholecalciferol (VITAMIN D3) 125 MCG (5000 UT) CAPS  Take 5,000 Units by mouth daily.     . fish oil-omega-3 fatty acids 1000 MG capsule Take 1 g by mouth daily.     . furosemide (LASIX) 40 MG tablet Take 1 tablet (40 mg total) by mouth daily as needed for fluid or edema. She forgets to take it 90 tablet 0  . Melatonin 5 MG TABS Take 1 tablet by mouth at bedtime as needed.    . Multiple Vitamin (MULTIVITAMIN WITH MINERALS) TABS Take 1 tablet by mouth daily.     [name] senna (SENOKOT) 8.6 MG TABS tablet Take 1 tablet by mouth daily as needed for mild constipation.    [name] SYNTHROID 125 MCG tablet Take 1 tablet by mouth Monday - Sunday 90 tablet 1   No current facility-administered medications for this visit.    REVIEW OF SYSTEMS:  Constitutional: ( - ) fevers, ( - )  chills , ( - ) night sweats Eyes: ( - ) blurriness of vision, ( - ) double vision, ( - ) watery eyes Ears, nose, mouth, throat, and face: ( - ) mucositis, ( - ) sore throat Respiratory: ( - ) cough, ( - ) dyspnea, ( - ) wheezes Cardiovascular: ( - ) palpitation, ( - ) chest discomfort, ( - ) lower extremity swelling Gastrointestinal:  ( - ) nausea, ( - ) heartburn, ( - ) change in bowel habits Skin: ( - ) abnormal skin rashes Lymphatics: ( - ) new lymphadenopathy, ( - ) easy bruising Neurological: ( - ) numbness, ( - ) tingling, ( - ) new weaknesses Behavioral/Psych: ( - ) mood change, ( - ) new changes  All other systems were reviewed with the patient and are negative.  PHYSICAL EXAMINATION: ECOG PERFORMANCE STATUS: 1 - Symptomatic but completely ambulatory  Vitals:   11/13/19 1434  BP: (!) 169/62  Pulse: 65  Resp: 18  Temp: 98.5 F (36.9 C)  SpO2: 100%   Filed Weights   11/13/19 1434  Weight: 158 lb 8 oz (71.9 kg)    GENERAL: well appearing elderly African American female in NAD  SKIN: skin color, texture, turgor are normal, no rashes or significant lesions EYES: conjunctiva are pink and non-injected, sclera clear LUNGS: clear to auscultation and percussion with  normal breathing effort HEART: regular rate & rhythm and no murmurs and with +2 pitting LE edema.  Musculoskeletal: no cyanosis of digits and no clubbing  PSYCH: alert & oriented x 3, fluent speech NEURO: no focal motor/sensory deficits  LABORATORY DATA:  I have reviewed the data as listed CBC Latest Ref Rng & Units 11/13/2019 03/25/2019 09/02/2018  WBC 4.0 - 10.5 K/uL 4.4 4.1 6.3  Hemoglobin 12.0 - 15.0 g/dL 12.5 13.2 12.5  Hematocrit 36.0 - 46.0 % 39.3 39.1 39.0  Platelets 150 - 400 K/uL 169 178 146(L)    CMP Latest Ref Rng & Units 11/13/2019 10/22/2019 09/25/2019  Glucose 70 - 99 mg/dL 93 - 86  BUN 8 - 23 mg/dL 18 - 15  Creatinine 0.44 - 1.00 mg/dL 0.92 - 1.05(H)  Sodium 135 - 145 mmol/L 143 - 142  Potassium 3.5 - 5.1 mmol/L 3.8 - 4.7  Chloride 98 - 111 mmol/L 105 - 103  CO2 22 - 32 mmol/L 30 - 25  Calcium 8.9 - 10.3 mg/dL 8.6(L) - 9.1  Total Protein 6.5 - 8.1 g/dL 7.8 7.1 7.4  Total Bilirubin 0.3 - 1.2 mg/dL 0.3 - <0.2  Alkaline Phos 38 - 126 U/L 66 - 74  AST 15 - 41 U/L 15 - 13  ALT 0 - 44 U/L 12 - 8     PATHOLOGY: None to review.   RADIOGRAPHIC STUDIES: None relevant to review.  No results found.  ASSESSMENT & PLAN Brianna [name] 86 y.o. female with medical history significant for HTN, hypothyroidism who presents for evaluation of her longstanding MGUS.  After review the lab and discussion with the patient her findings are most consistent with a monoclonal gammopathy of undetermined significance.  It would appear that her disease has been stable since she was last seen by Dr. Alvy Bimler approximately 5 years ago.  Today we will repeat our assessment of her monoclonal gammopathy to assure that her other labs are currently stable at this time.  Furthermore we will repeat a metastatic survey to assure that there are no new lytic lesions.  If all of the below labs appear normal we will have the patient return to follow-up in approximately 1 years time.  #Monoclonal Gammopathy of  Undetermined Significance --today will order CBC and CMP to assess for anemia, calcium level, and kidney dysfunction --will order SPEP with IFE, SFLC, quant Igs, LDH and beta-2-microglobulin --no prior M protein noted in the urine, will hold on ordering this unless other labs were concerning for renal dysfunction. --will order a DG bone survey to assess for lytic lesions. Repeat yearly.  --RTC in 1 years time or sooner if indicated by labs.   Orders Placed This Encounter  Procedures  . CBC with Differential (Cancer Center Only)    Standing Status:   Future    Number of Occurrences:   1    Standing Expiration Date:   11/12/2020  . CMP ([facility] only)    Standing Status:   Future    Number of Occurrences:   1    Standing Expiration Date:   11/12/2020  . Lactate dehydrogenase (LDH)    Standing Status:   Future    Number of Occurrences:   1    Standing Expiration Date:   11/12/2020  . Multiple Myeloma Panel (SPEP&IFE w/QIG)    Standing Status:   Future    Number of Occurrences:   1    Standing Expiration Date:   11/12/2020  . Kappa/lambda light chains    Standing Status:   Future    Number of Occurrences:   1    Standing Expiration Date:   11/12/2020  . Beta 2 microglobulin    Standing Status:   Future    Number of Occurrences:   1    Standing Expiration Date:   11/12/2020    All questions were answered. The patient knows to call the clinic with any problems, questions or concerns.  A total of more than 45 minutes were spent on this encounter and over half of that time was spent on counseling and coordination of care as outlined above.   Ledell Peoples, MD Department of Hematology/Oncology [facility] Phone: [phone] Pager: [phone] Email: [email or web address]  11/13/2019 5:00 PM

## 2019-11-13 ENCOUNTER — Inpatient Hospital Stay: Payer: Medicare HMO

## 2019-11-13 ENCOUNTER — Encounter: Payer: Medicare HMO | Admitting: Hematology and Oncology

## 2019-11-13 ENCOUNTER — Encounter: Payer: Self-pay | Admitting: Hematology and Oncology

## 2019-11-13 ENCOUNTER — Inpatient Hospital Stay: Payer: Medicare HMO | Attending: Hematology and Oncology | Admitting: Hematology and Oncology

## 2019-11-13 ENCOUNTER — Other Ambulatory Visit: Payer: Self-pay

## 2019-11-13 VITALS — BP 169/62 | HR 65 | Temp 98.5°F | Resp 18 | Ht 63.6 in | Wt 158.5 lb

## 2019-11-13 DIAGNOSIS — I10 Essential (primary) hypertension: Secondary | ICD-10-CM

## 2019-11-13 DIAGNOSIS — E039 Hypothyroidism, unspecified: Secondary | ICD-10-CM | POA: Insufficient documentation

## 2019-11-13 DIAGNOSIS — Z79899 Other long term (current) drug therapy: Secondary | ICD-10-CM | POA: Diagnosis not present

## 2019-11-13 DIAGNOSIS — D472 Monoclonal gammopathy: Secondary | ICD-10-CM

## 2019-11-13 DIAGNOSIS — Z7982 Long term (current) use of aspirin: Secondary | ICD-10-CM | POA: Insufficient documentation

## 2019-11-13 LAB — CBC WITH DIFFERENTIAL (CANCER CENTER ONLY)
Abs Immature Granulocytes: 0.01 10*3/uL (ref 0.00–0.07)
Basophils Absolute: 0 10*3/uL (ref 0.0–0.1)
Basophils Relative: 0 %
Eosinophils Absolute: 0 10*3/uL (ref 0.0–0.5)
Eosinophils Relative: 0 %
HCT: 39.3 % (ref 36.0–46.0)
Hemoglobin: 12.5 g/dL (ref 12.0–15.0)
Immature Granulocytes: 0 %
Lymphocytes Relative: 39 %
Lymphs Abs: 1.7 10*3/uL (ref 0.7–4.0)
MCH: 28.7 pg (ref 26.0–34.0)
MCHC: 31.8 g/dL (ref 30.0–36.0)
MCV: 90.3 fL (ref 80.0–100.0)
Monocytes Absolute: 0.3 10*3/uL (ref 0.1–1.0)
Monocytes Relative: 7 %
Neutro Abs: 2.4 10*3/uL (ref 1.7–7.7)
Neutrophils Relative %: 54 %
Platelet Count: 169 10*3/uL (ref 150–400)
RBC: 4.35 MIL/uL (ref 3.87–5.11)
RDW: 15.3 % (ref 11.5–15.5)
WBC Count: 4.4 10*3/uL (ref 4.0–10.5)
nRBC: 0 % (ref 0.0–0.2)

## 2019-11-13 LAB — CMP (CANCER CENTER ONLY)
ALT: 12 U/L (ref 0–44)
AST: 15 U/L (ref 15–41)
Albumin: 4 g/dL (ref 3.5–5.0)
Alkaline Phosphatase: 66 U/L (ref 38–126)
Anion gap: 8 (ref 5–15)
BUN: 18 mg/dL (ref 8–23)
CO2: 30 mmol/L (ref 22–32)
Calcium: 8.6 mg/dL — ABNORMAL LOW (ref 8.9–10.3)
Chloride: 105 mmol/L (ref 98–111)
Creatinine: 0.92 mg/dL (ref 0.44–1.00)
GFR, Est AFR Am: >60 mL/min (ref >60)
GFR, Estimated: 57 mL/min — ABNORMAL LOW (ref >60)
Glucose, Bld: 93 mg/dL (ref 70–99)
Potassium: 3.8 mmol/L (ref 3.5–5.1)
Sodium: 143 mmol/L (ref 135–145)
Total Bilirubin: 0.3 mg/dL (ref 0.3–1.2)
Total Protein: 7.8 g/dL (ref 6.5–8.1)

## 2019-11-13 LAB — LACTATE DEHYDROGENASE: LDH: 177 U/L (ref 98–192)

## 2019-11-14 LAB — KAPPA/LAMBDA LIGHT CHAINS
Kappa free light chain: 28.8 mg/L — ABNORMAL HIGH (ref 3.3–19.4)
Kappa, lambda light chain ratio: 1.52 (ref 0.26–1.65)
Lambda free light chains: 18.9 mg/L (ref 5.7–26.3)

## 2019-11-15 ENCOUNTER — Ambulatory Visit (INDEPENDENT_AMBULATORY_CARE_PROVIDER_SITE_OTHER): Payer: Medicare HMO | Admitting: Pharmacist

## 2019-11-15 DIAGNOSIS — E039 Hypothyroidism, unspecified: Secondary | ICD-10-CM | POA: Diagnosis not present

## 2019-11-15 DIAGNOSIS — I10 Essential (primary) hypertension: Secondary | ICD-10-CM | POA: Diagnosis not present

## 2019-11-15 LAB — BETA 2 MICROGLOBULIN, SERUM: Beta-2 Microglobulin: 1.4 mg/L (ref 0.6–2.4)

## 2019-11-15 NOTE — Progress Notes (Signed)
  Chronic Care Management   Outreach Note  11/15/2019 Name: Brianna [name] MRN: [id number] DOB: 1933/08/20  Referred by: [name], MD Reason for referral : Chronic Care Management   An unsuccessful telephone outreach was attempted today. The patient was referred to the case management team by for assistance with care management and care coordination.  Phone range [phone] with no voicemail available.  Chart reviewed/case discussion.  Call to pharmacy placed to follow up on refills/compliance.  Follow Up Plan: The care management team will reach out to the patient again over the next 10-14 days.   SIGNATURE .Regina Eck, PharmD, BCPS Clinical Pharmacist, [facility] [place]: [phone]

## 2019-11-16 LAB — MULTIPLE MYELOMA PANEL, SERUM
Albumin SerPl Elph-Mcnc: 3.9 g/dL (ref 2.9–4.4)
Albumin/Glob SerPl: 1.2 (ref 0.7–1.7)
Alpha 1: 0.2 g/dL (ref 0.0–0.4)
Alpha2 Glob SerPl Elph-Mcnc: 0.7 g/dL (ref 0.4–1.0)
B-Globulin SerPl Elph-Mcnc: 0.9 g/dL (ref 0.7–1.3)
Gamma Glob SerPl Elph-Mcnc: 1.6 g/dL (ref 0.4–1.8)
Globulin, Total: 3.4 g/dL (ref 2.2–3.9)
IgA: 336 mg/dL (ref 64–422)
IgG (Immunoglobin G), Serum: 1076 mg/dL (ref 586–1602)
IgM (Immunoglobulin M), Srm: 836 mg/dL — ABNORMAL HIGH (ref 26–217)
M Protein SerPl Elph-Mcnc: 0.5 g/dL — ABNORMAL HIGH
Total Protein ELP: 7.3 g/dL (ref 6.0–8.5)

## 2019-11-19 ENCOUNTER — Other Ambulatory Visit: Payer: Medicare HMO

## 2019-11-19 ENCOUNTER — Other Ambulatory Visit: Payer: Self-pay

## 2019-11-19 DIAGNOSIS — E039 Hypothyroidism, unspecified: Secondary | ICD-10-CM | POA: Diagnosis not present

## 2019-11-20 ENCOUNTER — Telehealth: Payer: Self-pay

## 2019-11-20 LAB — TSH: TSH: 1.56 u[IU]/mL (ref 0.450–4.500)

## 2019-11-22 ENCOUNTER — Telehealth: Payer: Self-pay

## 2019-11-22 NOTE — Telephone Encounter (Signed)
-----   Message from Otila Kluver, RN sent at 11/22/2019  4:13 PM EST -----  ----- Message ----- From: Orson Slick, MD Sent: 11/20/2019  10:35 AM EST To: Otila Kluver, RN  Please let Mrs. Macias know that her MGUS is stable based on her labs. We can plan to have her return in 12 months to reassess. If she has any issues in the interim encourage her to call.  Colan Neptune  ----- Message ----- From: Buel Ream, Lab In [employer] Sent: 11/13/2019   3:20 PM EST To: Orson Slick, MD

## 2019-11-22 NOTE — Telephone Encounter (Signed)
Attempted to call patient, no answer and unable to leave a VM.

## 2019-12-04 ENCOUNTER — Telehealth: Payer: Self-pay | Admitting: Pharmacist

## 2019-12-04 ENCOUNTER — Telehealth: Payer: Self-pay

## 2019-12-04 NOTE — Telephone Encounter (Signed)
TCT patient regarding her lab results, informed her that her MGUS is stable based on her labs and that Dr. Lorenso Courier will plan to see her again in 12 months to reassess. Patient was encouraged to call with any concerns and she verbalized understanding.

## 2019-12-04 NOTE — Telephone Encounter (Signed)
-----   Message from Otila Kluver, RN sent at 11/22/2019  4:13 PM EST -----  ----- Message ----- From: Orson Slick, MD Sent: 11/20/2019  10:35 AM EST To: Otila Kluver, RN  Please let Mrs. Galletti know that her MGUS is stable based on her labs. We can plan to have her return in 12 months to reassess. If she has any issues in the interim encourage her to call.  Colan Neptune  ----- Message ----- From: Buel Ream, Lab In [place] Sent: 11/13/2019   3:20 PM EST To: Orson Slick, MD

## 2019-12-08 ENCOUNTER — Other Ambulatory Visit: Payer: Self-pay | Admitting: Internal Medicine

## 2019-12-20 ENCOUNTER — Ambulatory Visit: Payer: Self-pay | Attending: Internal Medicine

## 2019-12-20 DIAGNOSIS — Z23 Encounter for immunization: Secondary | ICD-10-CM | POA: Insufficient documentation

## 2019-12-20 NOTE — Progress Notes (Signed)
   Covid-19 Vaccination Clinic  Name:  Brianna [name]    MRN: [id number] DOB: [birth year]  12/20/2019  [name] was observed post Covid-19 immunization for 15 minutes without incident. She was provided with Vaccine Information Sheet and instruction to access the V-Safe system.   [name] was instructed to call 911 with any severe reactions post vaccine: [name] Difficulty breathing  . Swelling of face and throat  . A fast heartbeat  . A bad rash all over body  . Dizziness and weakness

## 2019-12-24 ENCOUNTER — Other Ambulatory Visit: Payer: Self-pay | Admitting: Internal Medicine

## 2019-12-26 ENCOUNTER — Telehealth: Payer: Self-pay

## 2020-01-08 ENCOUNTER — Other Ambulatory Visit: Payer: Self-pay | Admitting: Internal Medicine

## 2020-01-15 ENCOUNTER — Ambulatory Visit: Payer: Medicare HMO | Attending: Internal Medicine

## 2020-01-15 DIAGNOSIS — Z23 Encounter for immunization: Secondary | ICD-10-CM

## 2020-01-15 NOTE — Progress Notes (Signed)
   Covid-19 Vaccination Clinic  Name:  Brianna [name]    MRN: [id number] DOB: 1933/03/08  01/15/2020  [name] was observed post Covid-19 immunization for 15 minutes without incident. She was provided with Vaccine Information Sheet and instruction to access the V-Safe system.   [name] was instructed to call 911 with any severe reactions post vaccine: [name] Difficulty breathing  . Swelling of face and throat  . A fast heartbeat  . A bad rash all over body  . Dizziness and weakness   Immunizations Administered    Name Date Dose VIS Date Route   Pfizer COVID-19 Vaccine 01/15/2020  2:28 PM 0.3 mL 09/27/2019 Intramuscular   Manufacturer: [place]   Lot: [id number]   [place]: [id number]

## 2020-02-13 ENCOUNTER — Other Ambulatory Visit: Payer: Self-pay

## 2020-02-13 ENCOUNTER — Ambulatory Visit (INDEPENDENT_AMBULATORY_CARE_PROVIDER_SITE_OTHER): Payer: Medicare HMO

## 2020-02-13 ENCOUNTER — Telehealth: Payer: Self-pay

## 2020-02-13 DIAGNOSIS — I10 Essential (primary) hypertension: Secondary | ICD-10-CM | POA: Diagnosis not present

## 2020-02-13 DIAGNOSIS — H16223 Keratoconjunctivitis sicca, not specified as Sjogren's, bilateral: Secondary | ICD-10-CM | POA: Diagnosis not present

## 2020-02-13 DIAGNOSIS — H9190 Unspecified hearing loss, unspecified ear: Secondary | ICD-10-CM

## 2020-02-13 DIAGNOSIS — E039 Hypothyroidism, unspecified: Secondary | ICD-10-CM | POA: Diagnosis not present

## 2020-02-13 DIAGNOSIS — R413 Other amnesia: Secondary | ICD-10-CM

## 2020-02-17 NOTE — Chronic Care Management (AMB) (Signed)
Chronic Care Management   Follow Up Note   02/14/2020 Name: Brianna [name] MRN: [id number] DOB: 01-Oct-1933  Referred by: [name], MD Reason for referral : Chronic Care Management (FU RN Call )   Brianna [name] is a 86 y.o. year old female who is a primary care patient of [name], MD. The CCM team was consulted for assistance with chronic disease management and care coordination needs.    Review of patient status, including review of consultants reports, relevant laboratory and other test results, and collaboration with appropriate care team members and the patient's provider was performed as part of comprehensive patient evaluation and provision of chronic care management services.    SDOH (Social Determinants of Health) assessments performed: Yes - No Acute Challenges identified at this time  See Care Plan activities for detailed interventions related to [facility])   Placed outbound call to patient for a CCM RN CM follow up.     Outpatient Encounter Medications as of 02/13/2020  Medication Sig  . amLODipine (NORVASC) 5 MG tablet TAKE 1 TABLET BY MOUTH EVERY DAY  . aspirin 81 MG tablet Take 81 mg by mouth daily.  . Cholecalciferol (VITAMIN D3) 125 MCG (5000 UT) CAPS Take 5,000 Units by mouth daily.   . fish oil-omega-3 fatty acids 1000 MG capsule Take 1 g by mouth daily.   . furosemide (LASIX) 40 MG tablet TAKE 1 TABLET (40 MG TOTAL) BY MOUTH DAILY AS NEEDED FOR FLUID OR EDEMA. SHE FORGETS TO TAKE IT  . Melatonin 5 MG TABS Take 1 tablet by mouth at bedtime as needed.  . Multiple Vitamin (MULTIVITAMIN WITH MINERALS) TABS Take 1 tablet by mouth daily.   [name] senna (SENOKOT) 8.6 MG TABS tablet Take 1 tablet by mouth daily as needed for mild constipation.  [name] SYNTHROID 125 MCG tablet Take 1 tablet by mouth Monday - Sunday   No facility-administered encounter medications on file as of 02/13/2020.     Objective:  Lab Results  Component Value Date   HGBA1C (H) 04/16/2010   6.2 (NOTE)                                                                       According to the ADA Clinical Practice Recommendations for 2011, when HbA1c is used as a screening test:   >=6.5%   Diagnostic of Diabetes Mellitus           (if abnormal result  is confirmed)  5.7-6.4%   Increased risk of developing Diabetes Mellitus  References:Diagnosis and Classification of Diabetes Mellitus,Diabetes TGGY,6948,54(OEVOJ 1):S62-S69 and Standards of Medical Care in         Diabetes - 2011,Diabetes Care,2011,34  (Suppl 1):S11-S61.   Lab Results  Component Value Date   MICROALBUR 30 09/18/2019   LDLCALC 113 (H) 12/12/2018   CREATININE 0.92 11/13/2019   BP Readings from Last 3 Encounters:  11/13/19 (!) 169/62  10/22/19 126/84  09/17/19 134/70    Goals Addressed      Patient Stated   . COMPLETED: "I get confused on how to take my thyroid medication" (pt-stated)       Current Barriers:  [name] Knowledge Deficit related to Hypothyroidism  . Chronic Disease Management support and education needs  related to Hypothyroidism, HTN and Memory loss  Nurse Case Manager Clinical Goal(s):  [name] Over the next 90 days, patients thyroid levels will be within normal range. Goal Not Met . 03/29/19: Goal reestablished-Over the next 30 days, patient will report taking her Synthroid exactly as prescribed w/o missed or delayed doses. Goal Not Met - patient's follow up TSH support patient is not taking Synthroid as prescribed . 09/30/19: Over the next 90 days, patient's thyroid level will be within normal range.  Goal Met  CCM RN CM Interventions:  02/14/20 call completed with patient  . Assessed for adherence to patient following her medication regimen for taking Synthroid as directed by [name] Cancer  (pt states she is adhering and is using her pill box to help her remember to take meds, she is taking thyroid medication in the am 1-2 hours before breakfast) Reviewed most recent TSH lab results obtained on 11/19/19;  TSH  1.560 (normal range - 0.450-4.500 ulU mL )  . Assessed for questions or concerns related to thyroid dysfunction and or medication dosage/frequency, pt denies . Positive reinforcement given to patient for following MD recommendations and taking Thyroid medication exactly as prescribed  . Discussed plans with patient for ongoing care management follow up and provided patient with direct contact information for care management team  Patient Self Care Activities:   Verbalizes understanding of the education/information provided today  Self administers medications as prescribed . Drives self and attends all scheduled provider appointments . Calls pharmacy for medication refills . Performs ADL's independently . Calls provider office for new concerns or questions  Please see past updates related to this goal by clicking on the "Past Updates" button in the selected goal     . "I still have a some problems with remembering things" (pt-stated)       CARE PLAN ENTRY (see longitudinal plan of care for additional care plan information)  Current Barriers:  [name] Knowledge Deficits related to disease process and Self Health management of Dementia . Chronic Disease Management support and education needs related to HTN, hearing loss, memory loss, hypothyroidism  Nurse Case Manager Clinical Goal(s):  [name] Over the next 90 days, patient will work with CCM RN CM and PCP to address needs related to disease education and support to improve Self Health management of Dementia  Interventions:  . Inter-disciplinary care team collaboration (see longitudinal plan of care) . Evaluation of current treatment plan related to memory loss  and patient's adherence to plan as established by provider . Determined patient continues to have memory loss but continues to provide self care and lives with adult son . Determined patient completed Neuro consultation with Dr. Jaynee Eagles on 04/17/19 with the following OV note  reviewed; o Assessment/Plan:  Lovely 86 year old here with her son who both provide much information. 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 However she denies anything significant, son denies any significant problems and they decline any further testing. MRI brain was unremarkable. MMSE showed 21/30 which does show mild to moderate  cognitive impairment. I recommend limiting driving. Son lives with her and needs to continue helping. She should continue getting resources with CCM as long as she can and social work. We will follow clinically. They decline any medication such as Aricept. Limit driving to close familiar places within 2-3 miles and only during day. We will schedule a follow up. o A total of 60 minutes was  spent face-to-face with this patient. Over half this time was spent on counseling patient on the  o 1. MCI (mild cognitive impairment)  o   diagnosis and different diagnostic and therapeutic options, counseling and coordination of care, risks ans benefits of management, compliance, or risk factor reduction and education.    . Advised patient to continue using her calendar system to record all pertinent appointments and phone numbers/contact information  . Discussed plans with patient for ongoing care management follow up and provided patient with direct contact information for care management team . Reviewed scheduled/upcoming provider appointments including: PCP f/u with [name] Cancer scheduled for 02/19/20 '@11' :45 AM and Neuro f/u at [facility] on 04/22/20 '@1PM'  with Ward Givens for a 1 yr check up . Discussed plans with patient for ongoing care management follow up and provided patient with direct contact information for care management team  Patient Self Care Activities:  . Self administers medications as prescribed . Attends all scheduled provider appointments . Calls pharmacy for medication refills . Performs ADL's  independently . Performs IADL's independently . Calls provider office for new concerns or questions  Initial goal documentation    . COMPLETED: "My legs are still swollen & red" (pt-stated)       Current Barriers:  [name] Knowledge Deficits related to diagnosis and treatment of bilateral lower leg edema . Chronic Disease Management support and education needs related to Hypothyroidism, HTN and Memory loss . Suspicious for Venous Insufficiency   Nurse Case Manager Clinical Goal(s):  [name] Over the next 30 days, patient will have a new appointment scheduled with [facility] to evaluate bilateral lower extremity edema . Over the next 60 days, patient will work with the CCM team and PCP to address needs related to education and support for diagnosis/treatment of bilateral lower leg edema and erythema  CCM RN CM Interventions:  02/14/20 call completed with patient  . Evaluation of current treatment plan related to bilateral lower leg edema and erythema and patient's adherence to plan as established by provider. . Determined patient did not follow up with [facility] for LE Venous US to rule out DVT . Discussed patient feels her edema is very mild and she is taking her diuretic as recommended with good effectiveness, she is elevating lower extremities while resting  . Discussed plans with patient for ongoing care management follow up and provided patient with direct contact information for care management team  Patient Self Care Activities:  . Self administers medications as prescribed . Attends all scheduled provider appointments . Performs ADL's independently . Performs IADL's independently . Calls provider office for new concerns or questions  Please see past updates related to this goal by clicking on the "Past Updates" button in the selected goal     . COMPLETED: "My skin has been dry & itchy" (pt-stated)       Current Barriers:  [name] Knowledge Deficits related to treatment  management for impaired skin integrity . Chronic Disease Management support and education needs related to Hypothyroidism, HTN and memory loss  Nurse Case Manager Clinical Goal(s):  [name] Over the next 30 days, patient will report her skin is intact and without cellulitis and or open wounds Goal Met . Over the next 90 days, patient will verbalize having an increased understanding of how to identify early s/s of impaired skin integrity and will verbalize increased understanding about how to properly care for her skin to decrease the risk for having open wounds or other complications that may occur  from impaired skin integrity Goal Met   CCM RN CM Interventions:  02/14/20 call completed with patient  . Evaluation of current treatment plan related to Cellulitis of right lower leg and patient's adherence to plan as established by provider . Determined patient completed f/u with Dr. 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 Discussed patient is adhering to taking her diuretic as prescribed by PCP with good effectiveness . Determined patient is elevating lower extremities when resting and currently has no c/o related to past Cellulitis  . Discussed plans with patient for ongoing care management follow up and provided patient with direct contact information for care management team  Patient Self Care Activities:  . Self administers medications as prescribed . Attends all scheduled provider appointments . Calls pharmacy for medication refills . Attends church or other social activities . Performs ADL's independently . Performs IADL's independently . Calls provider office for new concerns or questions  Please see past updates related to this goal by clicking on the "Past Updates" button in the selected goal        Plan:   Telephone follow up appointment with care management team member scheduled for: 03/27/20  Barb Merino, RN, BSN,  CCM Care Management Coordinator [place] [facility]  Direct Phone: [phone]

## 2020-02-17 NOTE — Patient Instructions (Signed)
Visit Information  Goals Addressed      Patient Stated   . COMPLETED: "I get confused on how to take my thyroid medication" (pt-stated)       Current Barriers:  [name] Knowledge Deficit related to Hypothyroidism  . Chronic Disease Management support and education needs related to Hypothyroidism, HTN and Memory loss  Nurse Case Manager Clinical Goal(s):  [name] Over the next 90 days, patients thyroid levels will be within normal range. Goal Not Met . 03/29/19: Goal reestablished-Over the next 30 days, patient will report taking her Synthroid exactly as prescribed w/o missed or delayed doses. Goal Not Met - patient's follow up TSH support patient is not taking Synthroid as prescribed . 09/30/19: Over the next 90 days, patient's thyroid level will be within normal range.  Goal Met  CCM RN CM Interventions:  02/14/20 call completed with patient  . Assessed for adherence to patient following her medication regimen for taking Synthroid as directed by [name] Cancer  (pt states she is adhering and is using her pill box to help her remember to take meds, she is taking thyroid medication in the am 1-2 hours before breakfast) Reviewed most recent TSH lab results obtained on 11/19/19;  TSH 1.560 (normal range - 0.450-4.500 ulU mL )  . Assessed for questions or concerns related to thyroid dysfunction and or medication dosage/frequency, pt denies . Positive reinforcement given to patient for following MD recommendations and taking Thyroid medication exactly as prescribed  . Discussed plans with patient for ongoing care management follow up and provided patient with direct contact information for care management team  Patient Self Care Activities:   Verbalizes understanding of the education/information provided today  Self administers medications as prescribed . Drives self and attends all scheduled provider appointments . Calls pharmacy for medication refills . Performs ADL's independently . Calls provider office  for new concerns or questions  Please see past updates related to this goal by clicking on the "Past Updates" button in the selected goal     . "I still have a some problems with remembering things" (pt-stated)       CARE PLAN ENTRY (see longitudinal plan of care for additional care plan information)  Current Barriers:  [name] Knowledge Deficits related to disease process and Self Health management of Dementia . Chronic Disease Management support and education needs related to HTN, hearing loss, memory loss, hypothyroidism  Nurse Case Manager Clinical Goal(s):  [name] Over the next 90 days, patient will work with CCM RN CM and PCP to address needs related to disease education and support to improve Self Health management of Dementia  Interventions:  . Inter-disciplinary care team collaboration (see longitudinal plan of care) . Evaluation of current treatment plan related to memory loss  and patient's adherence to plan as established by provider . Determined patient continues to have memory loss but continues to provide self care and lives with adult son . Determined patient completed Neuro consultation with Dr. Jaynee Eagles on 04/17/19 with the following OV note reviewed; o Assessment/Plan:  Lovely 86 year old here with her son who both provide much information. 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 However she denies anything significant, son denies any significant problems and they decline any further testing. MRI brain was unremarkable. MMSE showed 21/30 which does show mild to moderate  cognitive impairment. I recommend limiting driving. Son lives with her and needs to continue helping. She should  continue getting resources with CCM as long as she can and social work. We will follow clinically. They decline any medication such as Aricept. Limit driving to close familiar places within 2-3 miles and only during day. We will schedule a  follow up. o A total of 60 minutes was spent face-to-face with this patient. Over half this time was spent on counseling patient on the  o 1. MCI (mild cognitive impairment)  o   diagnosis and different diagnostic and therapeutic options, counseling and coordination of care, risks ans benefits of management, compliance, or risk factor reduction and education.    . Advised patient to continue using her calendar system to record all pertinent appointments and phone numbers/contact information  . Discussed plans with patient for ongoing care management follow up and provided patient with direct contact information for care management team . Reviewed scheduled/upcoming provider appointments including: PCP f/u with [name] Cancer scheduled for 02/19/20 '@11' :45 AM and Neuro f/u at [facility] on 04/22/20 '@1PM'  with Ward Givens for a 1 yr check up . Discussed plans with patient for ongoing care management follow up and provided patient with direct contact information for care management team  Patient Self Care Activities:  . Self administers medications as prescribed . Attends all scheduled provider appointments . Calls pharmacy for medication refills . Performs ADL's independently . Performs IADL's independently . Calls provider office for new concerns or questions  Initial goal documentation     . COMPLETED: "My legs are still swollen & red" (pt-stated)       Current Barriers:  [name] Knowledge Deficits related to diagnosis and treatment of bilateral lower leg edema . Chronic Disease Management support and education needs related to Hypothyroidism, HTN and Memory loss . Suspicious for Venous Insufficiency   Nurse Case Manager Clinical Goal(s):  [name] Over the next 30 days, patient will have a new appointment scheduled with [facility] to evaluate bilateral lower extremity edema . Over the next 60 days, patient will work with the CCM team and PCP to address needs related to education and support for  diagnosis/treatment of bilateral lower leg edema and erythema  CCM RN CM Interventions:  02/14/20 call completed with patient  . Evaluation of current treatment plan related to bilateral lower leg edema and erythema and patient's adherence to plan as established by provider. . Determined patient did not follow up with [facility] for LE Venous US to rule out DVT . Discussed patient feels her edema is very mild and she is taking her diuretic as recommended with good effectiveness, she is elevating lower extremities while resting  . Discussed plans with patient for ongoing care management follow up and provided patient with direct contact information for care management team  Patient Self Care Activities:  . Self administers medications as prescribed . Attends all scheduled provider appointments . Performs ADL's independently . Performs IADL's independently . Calls provider office for new concerns or questions  Please see past updates related to this goal by clicking on the "Past Updates" button in the selected goal     . COMPLETED: "My skin has been dry & itchy" (pt-stated)       Current Barriers:  [name] Knowledge Deficits related to treatment management for impaired skin integrity . Chronic Disease Management support and education needs related to Hypothyroidism, HTN and memory loss  Nurse Case Manager Clinical Goal(s):  [name] Over the next 30 days, patient will report her skin is intact and without cellulitis and or open wounds Goal  Met . Over the next 90 days, patient will verbalize having an increased understanding of how to identify early s/s of impaired skin integrity and will verbalize increased understanding about how to properly care for her skin to decrease the risk for having open wounds or other complications that may occur from impaired skin integrity Goal Met   CCM RN CM Interventions:  02/14/20 call completed with patient  . Evaluation of current treatment plan related to  Cellulitis of right lower leg and patient's adherence to plan as established by provider . Determined patient completed f/u with Dr. 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 Discussed patient is adhering to taking her diuretic as prescribed by PCP with good effectiveness . Determined patient is elevating lower extremities when resting and currently has no c/o related to past Cellulitis  . Discussed plans with patient for ongoing care management follow up and provided patient with direct contact information for care management team  Patient Self Care Activities:  . Self administers medications as prescribed . Attends all scheduled provider appointments . Calls pharmacy for medication refills . Attends church or other social activities . Performs ADL's independently . Performs IADL's independently . Calls provider office for new concerns or questions  Please see past updates related to this goal by clicking on the "Past Updates" button in the selected goal       Patient verbalizes understanding of instructions provided today.   Telephone follow up appointment with care management team member scheduled for: 03/27/20  Barb Merino, RN, BSN, CCM Care Management Coordinator [place] [facility]  Direct Phone: [phone]

## 2020-02-19 ENCOUNTER — Other Ambulatory Visit: Payer: Self-pay

## 2020-02-19 ENCOUNTER — Ambulatory Visit (INDEPENDENT_AMBULATORY_CARE_PROVIDER_SITE_OTHER): Payer: Medicare HMO | Admitting: Internal Medicine

## 2020-02-19 ENCOUNTER — Encounter: Payer: Self-pay | Admitting: Internal Medicine

## 2020-02-19 ENCOUNTER — Ambulatory Visit (INDEPENDENT_AMBULATORY_CARE_PROVIDER_SITE_OTHER): Payer: Medicare HMO

## 2020-02-19 VITALS — BP 118/70 | HR 89 | Temp 98.4°F | Ht 64.0 in | Wt 160.8 lb

## 2020-02-19 DIAGNOSIS — E663 Overweight: Secondary | ICD-10-CM | POA: Diagnosis not present

## 2020-02-19 DIAGNOSIS — N182 Chronic kidney disease, stage 2 (mild): Secondary | ICD-10-CM | POA: Diagnosis not present

## 2020-02-19 DIAGNOSIS — I129 Hypertensive chronic kidney disease with stage 1 through stage 4 chronic kidney disease, or unspecified chronic kidney disease: Secondary | ICD-10-CM

## 2020-02-19 DIAGNOSIS — Z0000 Encounter for general adult medical examination without abnormal findings: Secondary | ICD-10-CM

## 2020-02-19 DIAGNOSIS — E039 Hypothyroidism, unspecified: Secondary | ICD-10-CM

## 2020-02-19 DIAGNOSIS — G3184 Mild cognitive impairment, so stated: Secondary | ICD-10-CM

## 2020-02-19 DIAGNOSIS — Z6827 Body mass index (BMI) 27.0-27.9, adult: Secondary | ICD-10-CM

## 2020-02-19 NOTE — Progress Notes (Signed)
This visit occurred during the SARS-CoV-2 public health emergency.  Safety protocols were in place, including screening questions prior to the visit, additional usage of staff PPE, and extensive cleaning of exam room while observing appropriate contact time as indicated for disinfecting solutions.  Subjective:   Brianna [name] is a 86 y.o. female who presents for Medicare Annual (Subsequent) preventive examination.  Review of Systems:  n/a Cardiac Risk Factors include: advanced age (>63men, >74 women);hypertension     Objective:     Vitals: BP 118/70   Pulse 89   Temp 98.4 F (36.9 C) (Oral)   Ht 5\' 4"  (1.626 m)   Wt 160 lb 12.8 oz (72.9 kg)   BMI 27.60 kg/m   Body mass index is 27.6 kg/m.  Advanced Directives 02/19/2020 11/13/2019 09/17/2019 12/27/2018 09/02/2018 03/10/2015 07/24/2014  Does Patient Have a Medical Advance Directive? No No No No No No No  Would patient like information on creating a medical advance directive? Yes (MAU/Ambulatory/Procedural Areas - Information given) No - Patient declined - Yes (MAU/Ambulatory/Procedural Areas - Information given) Yes (ED - Information included in AVS) No - patient declined information Yes - Educational materials given  Pre-existing out of facility DNR order (yellow form or pink MOST form) - - - - - - -    Tobacco Social History   Tobacco Use  Smoking Status Never Smoker  Smokeless Tobacco Never Used     Counseling given: Not Answered   Clinical Intake:  Pre-visit preparation completed: No  Pain : No/denies pain     Nutritional Status: BMI 25 -29 Overweight Nutritional Risks: None  How often do you need to have someone help you when you read instructions, pamphlets, or other written materials from your doctor or pharmacy?: 1 - Never  Interpreter Needed?: No  Information entered by :: NAllen LPN  Past Medical History:  Diagnosis Date  . Headache 12/24/2013  . Headache(784.0)   . Hypertension    pcp  preston  clark  . Hypothyroid   . MGUS (monoclonal gammopathy of unknown significance) 12/10/2013   Past Surgical History:  Procedure Laterality Date  . ABDOMINAL HYSTERECTOMY    . bladder tac    . BREAST BIOPSY Right 04/2017  . CATARACT EXTRACTION W/PHACO  08/22/2012   Procedure: CATARACT EXTRACTION PHACO AND INTRAOCULAR LENS PLACEMENT (IOC);  Surgeon: Adonis Brook, MD;  Location: [facility];  Service: Ophthalmology;  Laterality: Left;  . CATARACT EXTRACTION W/PHACO Right 02/13/2013   Procedure: CATARACT EXTRACTION PHACO AND INTRAOCULAR LENS PLACEMENT (IOC);  Surgeon: Adonis Brook, MD;  Location: [facility];  Service: Ophthalmology;  Laterality: Right;   Family History  Problem Relation Age of Onset  . Cancer Maternal Grandfather        stomach cancer  . Cancer Paternal Grandfather        stomach cancer  . Healthy Mother   . Dementia Mother        at 47-99 y.o  . Healthy Father    Social History   Socioeconomic History  . Marital status: Widowed    Spouse name: Not on file  . Number of children: 3  . Years of education: 17.5-18   . Highest education level: Bachelor's degree (e.g., BA, AB, BS)  Occupational History  . Occupation: retired  Tobacco Use  . Smoking status: Never Smoker  . Smokeless tobacco: Never Used  Substance and Sexual Activity  . Alcohol use: Yes    Alcohol/week: 7.0 standard drinks    Types: 7 Shots of  liquor per week  . Drug use: No  . Sexual activity: Not Currently  Other Topics Concern  . Not on file  Social History Narrative   Lives at home. Her sons live with her.    Right handed   Social Determinants of Health   Financial Resource Strain: Low Risk   . Difficulty of Paying Living Expenses: Not hard at all  Food Insecurity: No Food Insecurity  . Worried About Charity fundraiser in the Last Year: Never true  . Ran Out of Food in the Last Year: Never true  Transportation Needs: No Transportation Needs  . Lack of Transportation (Medical): No  . Lack of  Transportation (Non-Medical): No  Physical Activity: Sufficiently Active  . Days of Exercise per Week: 7 days  . Minutes of Exercise per Session: 30 min  Stress: No Stress Concern Present  . Feeling of Stress : Not at all  Social Connections:   . Frequency of Communication with Friends and Family:   . Frequency of Social Gatherings with Friends and Family:   . Attends Religious Services:   . Active Member of Clubs or Organizations:   . Attends Archivist Meetings:   [name] Marital Status:     Outpatient Encounter Medications as of 02/19/2020  Medication Sig  . amLODipine (NORVASC) 5 MG tablet TAKE 1 TABLET BY MOUTH EVERY DAY  . aspirin 81 MG tablet Take 81 mg by mouth daily.  . Cholecalciferol (VITAMIN D3) 125 MCG (5000 UT) CAPS Take 5,000 Units by mouth daily.   . fish oil-omega-3 fatty acids 1000 MG capsule Take 1 g by mouth daily.   . furosemide (LASIX) 40 MG tablet TAKE 1 TABLET (40 MG TOTAL) BY MOUTH DAILY AS NEEDED FOR FLUID OR EDEMA. SHE FORGETS TO TAKE IT  . Melatonin 5 MG TABS Take 1 tablet by mouth at bedtime as needed.  . Multiple Vitamin (MULTIVITAMIN WITH MINERALS) TABS Take 1 tablet by mouth daily.   [name] senna (SENOKOT) 8.6 MG TABS tablet Take 1 tablet by mouth daily as needed for mild constipation.  [name] SYNTHROID 125 MCG tablet Take 1 tablet by mouth Monday - Sunday   No facility-administered encounter medications on file as of 02/19/2020.    Activities of Daily Living In your present state of health, do you have any difficulty performing the following activities: 02/19/2020 09/17/2019  Hearing? Y Y  Comment decreased hearing has a hearing aide that she does not use  Vision? N N  Difficulty concentrating or making decisions? N N  Comment - -  Walking or climbing stairs? N N  Dressing or bathing? N N  Doing errands, shopping? Y N  Comment someone gives a ride Facilities manager and eating ? N N  Using the Toilet? N N  In the past six months, have you accidently  leaked urine? N N  Do you have problems with loss of bowel control? N N  Managing your Medications? N N  Managing your Finances? N N  Housekeeping or managing your Housekeeping? N N  Some recent data might be hidden    Patient Care Team: [name], MD as PCP - General (Internal Medicine) Heath Lark, MD as Consulting Physician (Hematology and Oncology) Daneen Schick as Social Worker [place], Claudette Stapler, RN as Case Manager    Assessment:   This is a routine wellness examination for [place].  Exercise Activities and Dietary recommendations Current Exercise Habits: Home exercise routine, Type of exercise: walking, Time (  Minutes): 30, Frequency (Times/Week): 7, Weekly Exercise (Minutes/Week): 210  Goals    . "I still have a some problems with remembering things" (pt-stated)     CARE PLAN ENTRY (see longitudinal plan of care for additional care plan information)  Current Barriers:  [name] Knowledge Deficits related to disease process and Self Health management of Dementia . Chronic Disease Management support and education needs related to HTN, hearing loss, memory loss, hypothyroidism  Nurse Case Manager Clinical Goal(s):  [name] Over the next 90 days, patient will work with CCM RN CM and PCP to address needs related to disease education and support to improve Self Health management of Dementia  Interventions:  . Inter-disciplinary care team collaboration (see longitudinal plan of care) . Evaluation of current treatment plan related to memory loss  and patient's adherence to plan as established by provider . Determined patient continues to have memory loss but continues to provide self care and lives with adult son . Determined patient completed Neuro consultation with Dr. Jaynee Eagles on 04/17/19 with the following OV note reviewed; o Assessment/Plan:  Lovely 86 year old here with her son who both provide much information. 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 However she denies anything significant, son denies any significant problems and they decline any further testing. MRI brain was unremarkable. MMSE showed 21/30 which does show mild to moderate  cognitive impairment. I recommend limiting driving. Son lives with her and needs to continue helping. She should continue getting resources with CCM as long as she can and social work. We will follow clinically. They decline any medication such as Aricept. Limit driving to close familiar places within 2-3 miles and only during day. We will schedule a follow up. o A total of 60 minutes was spent face-to-face with this patient. Over half this time was spent on counseling patient on the  o 1. MCI (mild cognitive impairment)  o   diagnosis and different diagnostic and therapeutic options, counseling and coordination of care, risks ans benefits of management, compliance, or risk factor reduction and education.    . Advised patient to continue using her calendar system to record all pertinent appointments and phone numbers/contact information  . Discussed plans with patient for ongoing care management follow up and provided patient with direct contact information for care management team . Reviewed scheduled/upcoming provider appointments including: PCP f/u with [name] Cancer scheduled for 02/19/20 @11 :45 AM and Neuro f/u at [facility] on 04/22/20 @1PM  with Ward Givens for a 1 yr check up . Discussed plans with patient for ongoing care management follow up and provided patient with direct contact information for care management team  Patient Self Care Activities:  . Self administers medications as prescribed . Attends all scheduled provider appointments . Calls pharmacy for medication refills . Performs ADL's independently . Performs IADL's independently . Calls provider office for new concerns or questions  Initial goal documentation     . Patient Stated     09/17/2019, does not want  to gain any weight    . Patient Stated     02/19/2020, wants to keep walking       Fall Risk Fall Risk  02/19/2020 09/17/2019 08/19/2019 07/10/2019 03/25/2019  Falls in the past year? 0 0 0 0 0  Risk for fall due to : Medication side effect Medication side effect - - -  Follow up Falls evaluation completed;Education provided;Falls prevention discussed Falls evaluation completed;Education  provided;Falls prevention discussed - - -   Is the patient's home free of loose throw rugs in walkways, pet beds, electrical cords, etc?   yes      Grab bars in the bathroom? yes      Handrails on the stairs?   yes      Adequate lighting?   yes  Timed Get Up and Go performed: n/a  Depression Screen PHQ 2/9 Scores 02/19/2020 09/17/2019 08/19/2019 07/10/2019  PHQ - 2 Score 0 0 0 0  PHQ- 9 Score 0 3 - -     Cognitive Function MMSE - Mini Mental State Exam 04/17/2019  Orientation to time 4  Orientation to Place 5  Registration 3  Attention/ Calculation 1  Recall 0  Language- name 2 objects 2  Language- repeat 0  Language- follow 3 step command 3  Language- read & follow direction 1  Write a sentence 1  Copy design 1  Total score 21     6CIT Screen 02/19/2020 09/17/2019 02/25/2019  What Year? 0 points 4 points 0 points  What month? 0 points 0 points 0 points  What time? 3 points 0 points 0 points  Count back from 20 0 points 0 points 0 points  Months in reverse 4 points 0 points 4 points  Repeat phrase 10 points 10 points 10 points  Total Score 17 14 14     Immunization History  Administered Date(s) Administered  . Influenza-Unspecified 07/17/2014  . PFIZER SARS-COV-2 Vaccination 12/20/2019, 01/15/2020  . Pneumococcal Conjugate-13 09/17/2019  . Tdap 09/17/2019    Qualifies for Shingles Vaccine? yes  Screening Tests Health Maintenance  Topic Date Due  . INFLUENZA VACCINE  05/17/2020  . PNA vac Low Risk Adult (2 of 2 - PPSV23) 09/16/2020  . TETANUS/TDAP  09/16/2029  . DEXA SCAN  Completed  .  COVID-19 Vaccine  Completed    Cancer Screenings: Lung: Low Dose CT Chest recommended if Age 87-80 years, 30 pack-year currently smoking OR have quit w/in 15years. Patient does not qualify. Breast:  Up to date on Mammogram? Yes   Up to date of Bone Density/Dexa? Yes Colorectal: not required  Additional Screenings: : Hepatitis C Screening: n/a     Plan:    Patient wants to continue her walking routine.   I have personally reviewed and noted the following in the patient's chart:   . Medical and social history . Use of alcohol, tobacco or illicit drugs  . Current medications and supplements . Functional ability and status . Nutritional status . Physical activity . Advanced directives . List of other physicians . Hospitalizations, surgeries, and ER visits in previous 12 months . Vitals . Screenings to include cognitive, depression, and falls . Referrals and appointments  In addition, I have reviewed and discussed with patient certain preventive protocols, quality metrics, and best practice recommendations. A written personalized care plan for preventive services as well as general preventive health recommendations were provided to patient.     Kellie Simmering, LPN  [id number]

## 2020-02-19 NOTE — Patient Instructions (Signed)
Brianna [name] , Thank you for taking time to come for your Medicare Wellness Visit. I appreciate your ongoing commitment to your health goals. Please review the following plan we discussed and let me know if I can assist you in the future.   Screening recommendations/referrals: Colonoscopy: not required Mammogram: not required Bone Density: 11/2017 Recommended yearly ophthalmology/optometry visit for glaucoma screening and checkup Recommended yearly dental visit for hygiene and checkup  Vaccinations: Influenza vaccine: 05/2019 Pneumococcal vaccine: 09/2019 Tdap vaccine: 09/2019 Shingles vaccine: discussed    Advanced directives: Advance directive discussed with you today. I have provided a copy for you to complete at home and have notarized. Once this is complete please bring a copy in to our office so we can scan it into your chart.  Conditions/risks identified: overweight  Next appointment: 06/01/2020 at 11:30   Preventive Care 84 Years and Older, Female Preventive care refers to lifestyle choices and visits with your health care provider that can promote health and wellness. What does preventive care include?  A yearly physical exam. This is also called an annual well check.  Dental exams once or twice a year.  Routine eye exams. Ask your health care provider how often you should have your eyes checked.  Personal lifestyle choices, including:  Daily care of your teeth and gums.  Regular physical activity.  Eating a healthy diet.  Avoiding tobacco and drug use.  Limiting alcohol use.  Practicing safe sex.  Taking low-dose aspirin every day.  Taking vitamin and mineral supplements as recommended by your health care provider. What happens during an annual well check? The services and screenings done by your health care provider during your annual well check will depend on your age, overall health, lifestyle risk factors, and family history of disease. Counseling  Your  health care provider may ask you questions about your:  Alcohol use.  Tobacco use.  Drug use.  Emotional well-being.  Home and relationship well-being.  Sexual activity.  Eating habits.  History of falls.  Memory and ability to understand (cognition).  Work and work Statistician.  Reproductive health. Screening  You may have the following tests or measurements:  Height, weight, and BMI.  Blood pressure.  Lipid and cholesterol levels. These may be checked every 5 years, or more frequently if you are over 82 years old.  Skin check.  Lung cancer screening. You may have this screening every year starting at age 40 if you have a 30-pack-year history of smoking and currently smoke or have quit within the past 15 years.  Fecal occult blood test (FOBT) of the stool. You may have this test every year starting at age 29.  Flexible sigmoidoscopy or colonoscopy. You may have a sigmoidoscopy every 5 years or a colonoscopy every 10 years starting at age 18.  Hepatitis C blood test.  Hepatitis B blood test.  Sexually transmitted disease (STD) testing.  Diabetes screening. This is done by checking your blood sugar (glucose) after you have not eaten for a while (fasting). You may have this done every 1-3 years.  Bone density scan. This is done to screen for osteoporosis. You may have this done starting at age 84.  Mammogram. This may be done every 1-2 years. Talk to your health care provider about how often you should have regular mammograms. Talk with your health care provider about your test results, treatment options, and if necessary, the need for more tests. Vaccines  Your health care provider may recommend certain vaccines, such as:  Influenza vaccine. This is recommended every year.  Tetanus, diphtheria, and acellular pertussis (Tdap, Td) vaccine. You may need a Td booster every 10 years.  Zoster vaccine. You may need this after age 55.  Pneumococcal 13-valent  conjugate (PCV13) vaccine. One dose is recommended after age 73.  Pneumococcal polysaccharide (PPSV23) vaccine. One dose is recommended after age 11. Talk to your health care provider about which screenings and vaccines you need and how often you need them. This information is not intended to replace advice given to you by your health care provider. Make sure you discuss any questions you have with your health care provider. Document Released: 10/30/2015 Document Revised: 06/22/2016 Document Reviewed: 08/04/2015 Elsevier Interactive Patient Education  2017 [place] Prevention in the Home Falls can cause injuries. They can happen to people of all ages. There are many things you can do to make your home safe and to help prevent falls. What can I do on the outside of my home?  Regularly fix the edges of walkways and driveways and fix any cracks.  Remove anything that might make you trip as you walk through a door, such as a raised step or threshold.  Trim any bushes or trees on the path to your home.  Use bright outdoor lighting.  Clear any walking paths of anything that might make someone trip, such as rocks or tools.  Regularly check to see if handrails are loose or broken. Make sure that both sides of any steps have handrails.  Any raised decks and porches should have guardrails on the edges.  Have any leaves, snow, or ice cleared regularly.  Use sand or salt on walking paths during winter.  Clean up any spills in your garage right away. This includes oil or grease spills. What can I do in the bathroom?  Use night lights.  Install grab bars by the toilet and in the tub and shower. Do not use towel bars as grab bars.  Use non-skid mats or decals in the tub or shower.  If you need to sit down in the shower, use a plastic, non-slip stool.  Keep the floor dry. Clean up any water that spills on the floor as soon as it happens.  Remove soap buildup in the tub or  shower regularly.  Attach bath mats securely with double-sided non-slip rug tape.  Do not have throw rugs and other things on the floor that can make you trip. What can I do in the bedroom?  Use night lights.  Make sure that you have a light by your bed that is easy to reach.  Do not use any sheets or blankets that are too big for your bed. They should not hang down onto the floor.  Have a firm chair that has side arms. You can use this for support while you get dressed.  Do not have throw rugs and other things on the floor that can make you trip. What can I do in the kitchen?  Clean up any spills right away.  Avoid walking on wet floors.  Keep items that you use a lot in easy-to-reach places.  If you need to reach something above you, use a strong step stool that has a grab bar.  Keep electrical cords out of the way.  Do not use floor polish or wax that makes floors slippery. If you must use wax, use non-skid floor wax.  Do not have throw rugs and other things on the floor that  can make you trip. What can I do with my stairs?  Do not leave any items on the stairs.  Make sure that there are handrails on both sides of the stairs and use them. Fix handrails that are broken or loose. Make sure that handrails are as long as the stairways.  Check any carpeting to make sure that it is firmly attached to the stairs. Fix any carpet that is loose or worn.  Avoid having throw rugs at the top or bottom of the stairs. If you do have throw rugs, attach them to the floor with carpet tape.  Make sure that you have a light switch at the top of the stairs and the bottom of the stairs. If you do not have them, ask someone to add them for you. What else can I do to help prevent falls?  Wear shoes that:  Do not have high heels.  Have rubber bottoms.  Are comfortable and fit you well.  Are closed at the toe. Do not wear sandals.  If you use a stepladder:  Make sure that it is fully  opened. Do not climb a closed stepladder.  Make sure that both sides of the stepladder are locked into place.  Ask someone to hold it for you, if possible.  Clearly mark and make sure that you can see:  Any grab bars or handrails.  First and last steps.  Where the edge of each step is.  Use tools that help you move around (mobility aids) if they are needed. These include:  Canes.  Walkers.  Scooters.  Crutches.  Turn on the lights when you go into a dark area. Replace any light bulbs as soon as they burn out.  Set up your furniture so you have a clear path. Avoid moving your furniture around.  If any of your floors are uneven, fix them.  If there are any pets around you, be aware of where they are.  Review your medicines with your doctor. Some medicines can make you feel dizzy. This can increase your chance of falling. Ask your doctor what other things that you can do to help prevent falls. This information is not intended to replace advice given to you by your health care provider. Make sure you discuss any questions you have with your health care provider. Document Released: 07/30/2009 Document Revised: 03/10/2016 Document Reviewed: 11/07/2014 Elsevier Interactive Patient Education  2017 [employer].

## 2020-02-20 ENCOUNTER — Encounter: Payer: Self-pay | Admitting: Internal Medicine

## 2020-02-20 LAB — BMP8+EGFR
BUN/Creatinine Ratio: 19 (ref 12–28)
BUN: 22 mg/dL (ref 8–27)
CO2: 24 mmol/L (ref 20–29)
Calcium: 9.2 mg/dL (ref 8.7–10.3)
Chloride: 99 mmol/L (ref 96–106)
Creatinine, Ser: 1.17 mg/dL — ABNORMAL HIGH (ref 0.57–1.00)
GFR calc Af Amer: 49 mL/min/{1.73_m2} — ABNORMAL LOW (ref >59)
GFR calc non Af Amer: 43 mL/min/{1.73_m2} — ABNORMAL LOW (ref >59)
Glucose: 104 mg/dL — ABNORMAL HIGH (ref 65–99)
Potassium: 3.9 mmol/L (ref 3.5–5.2)
Sodium: 142 mmol/L (ref 134–144)

## 2020-02-20 LAB — TSH: TSH: 0.444 u[IU]/mL — ABNORMAL LOW (ref 0.450–4.500)

## 2020-02-24 DIAGNOSIS — Z6827 Body mass index (BMI) 27.0-27.9, adult: Secondary | ICD-10-CM | POA: Insufficient documentation

## 2020-02-24 DIAGNOSIS — N182 Chronic kidney disease, stage 2 (mild): Secondary | ICD-10-CM | POA: Insufficient documentation

## 2020-02-24 DIAGNOSIS — I129 Hypertensive chronic kidney disease with stage 1 through stage 4 chronic kidney disease, or unspecified chronic kidney disease: Secondary | ICD-10-CM | POA: Insufficient documentation

## 2020-02-24 DIAGNOSIS — E663 Overweight: Secondary | ICD-10-CM | POA: Insufficient documentation

## 2020-02-24 DIAGNOSIS — E039 Hypothyroidism, unspecified: Secondary | ICD-10-CM | POA: Insufficient documentation

## 2020-02-24 NOTE — Progress Notes (Signed)
This visit occurred during the SARS-CoV-2 public health emergency.  Safety protocols were in place, including screening questions prior to the visit, additional usage of staff PPE, and extensive cleaning of exam room while observing appropriate contact time as indicated for disinfecting solutions.  Subjective:     Patient ID: Brianna [name] , female    DOB: [birth year] , 86 y.o.   MRN: [id number]   Chief Complaint  Patient presents with  . Hypertension    HPI  She is here today for BP check. She reports compliance with meds.   Hypertension This is a chronic problem. The current episode started more than 1 year ago. The problem has been gradually improving since onset. The problem is controlled. Pertinent negatives include no blurred vision, chest pain, palpitations or shortness of breath. Risk factors for coronary artery disease include post-menopausal state and sedentary lifestyle. The current treatment provides moderate improvement. Compliance problems include exercise.      Past Medical History:  Diagnosis Date  . Headache 12/24/2013  . Headache(784.0)   . Hypertension    pcp  preston clark  . Hypothyroid   . MGUS (monoclonal gammopathy of unknown significance) 12/10/2013     Family History  Problem Relation Age of Onset  . Cancer Maternal Grandfather        stomach cancer  . Cancer Paternal Grandfather        stomach cancer  . Healthy Mother   . Dementia Mother        at 9-99 y.o  . Healthy Father      Current Outpatient Medications:  .  amLODipine (NORVASC) 5 MG tablet, TAKE 1 TABLET BY MOUTH EVERY DAY, Disp: 90 tablet, Rfl: 1 .  aspirin 81 MG tablet, Take 81 mg by mouth daily., Disp: , Rfl:  .  Cholecalciferol (VITAMIN D3) 125 MCG (5000 UT) CAPS, Take 5,000 Units by mouth daily. , Disp: , Rfl:  .  EYSUVIS 0.25 % SUSP, Apply to eye., Disp: , Rfl:  .  fish oil-omega-3 fatty acids 1000 MG capsule, Take 1 g by mouth daily. , Disp: , Rfl:  .  furosemide (LASIX) 40  MG tablet, TAKE 1 TABLET (40 MG TOTAL) BY MOUTH DAILY AS NEEDED FOR FLUID OR EDEMA. SHE FORGETS TO TAKE IT, Disp: 90 tablet, Rfl: 0 .  Melatonin 5 MG TABS, Take 1 tablet by mouth at bedtime as needed., Disp: , Rfl:  .  Multiple Vitamin (MULTIVITAMIN WITH MINERALS) TABS, Take 1 tablet by mouth daily. , Disp: , Rfl:  .  senna (SENOKOT) 8.6 MG TABS tablet, Take 1 tablet by mouth daily as needed for mild constipation., Disp: , Rfl:  .  SYNTHROID 125 MCG tablet, Take 1 tablet by mouth Monday - Sunday, Disp: 90 tablet, Rfl: 1 .  XIIDRA 5 % SOLN, Apply to eye. 2 drops per day, Disp: , Rfl:    Allergies  Allergen Reactions  . Iodine Other (See Comments)    Reaction unknown  . Sulfa Antibiotics Other (See Comments)    Childhood reaction     Review of Systems  Constitutional: Negative.   Eyes: Negative for blurred vision.  Respiratory: Negative.  Negative for shortness of breath.   Cardiovascular: Negative.  Negative for chest pain and palpitations.  Gastrointestinal: Negative.   Neurological: Negative.   Psychiatric/Behavioral: Negative.      Today's Vitals   02/19/20 1201  BP: 118/70  Pulse: 89  Temp: 98.4 F (36.9 C)  TempSrc: Oral  Weight: 160 lb  12.8 oz (72.9 kg)  Height: '5\' 4"'  (1.626 m)  PainSc: 0-No pain   Body mass index is 27.6 kg/m.   Objective:  Physical Exam Vitals and nursing note reviewed.  Constitutional:      Appearance: Normal appearance.  HENT:     Head: Normocephalic and atraumatic.  Cardiovascular:     Rate and Rhythm: Normal rate and regular rhythm.     Heart sounds: Normal heart sounds.  Pulmonary:     Effort: Pulmonary effort is normal.     Breath sounds: Normal breath sounds.  Skin:    General: Skin is warm.  Neurological:     General: No focal deficit present.     Mental Status: She is alert.  Psychiatric:        Mood and Affect: Mood normal.        Behavior: Behavior normal.         Assessment And Plan:     1. Hypertensive  nephropathy  Chronic, well controlled. She will continue with current meds. She is encouraged to avoid adding salt to her foods and take meds as prescribed. I will check renal function today.   - BMP8+EGFR  2. Chronic renal disease, stage II  Chronic, I will check renal function today. She is encouraged to stay well hydrated - drink at least 4-6 glasses of water daily.   - BMP8+EGFR  3. Primary hypothyroidism  I will check thyroid panel and adjust meds as needed.  - TSH  4. Overweight with body mass index (BMI) of 27 to 27.9 in adult  Her weight is stable for her demographic.   5. MCI (mild cognitive impairment)  She has had neuro evaluation. She still declines medication to treat this including Aricept.    Maximino Greenland, MD    THE PATIENT IS ENCOURAGED TO PRACTICE SOCIAL DISTANCING DUE TO THE COVID-19 PANDEMIC.

## 2020-02-27 DIAGNOSIS — H16223 Keratoconjunctivitis sicca, not specified as Sjogren's, bilateral: Secondary | ICD-10-CM | POA: Diagnosis not present

## 2020-03-27 ENCOUNTER — Telehealth: Payer: Self-pay

## 2020-04-22 ENCOUNTER — Ambulatory Visit: Payer: Medicare HMO | Admitting: Adult Health

## 2020-04-23 ENCOUNTER — Ambulatory Visit: Payer: Medicare HMO | Admitting: Adult Health

## 2020-04-23 ENCOUNTER — Other Ambulatory Visit: Payer: Self-pay

## 2020-04-23 ENCOUNTER — Encounter: Payer: Self-pay | Admitting: Adult Health

## 2020-04-23 VITALS — BP 155/81 | HR 71 | Ht 64.0 in | Wt 160.4 lb

## 2020-04-23 DIAGNOSIS — G3184 Mild cognitive impairment, so stated: Secondary | ICD-10-CM

## 2020-04-23 NOTE — Progress Notes (Addendum)
PATIENT: Brianna [name] DOB: 06/29/1933  REASON FOR VISIT: follow up HISTORY FROM: patient  HISTORY OF PRESENT ILLNESS: Today 04/23/20:  [name] is an 86 year old female with a history of memory disturbance.  She returns today for follow-up.  Overall she feels that her memory has remained stable.  She continues to live at home with her sons.  She is able to complete all ADLs independently.  She no longer operates a motor vehicle.  She manages all of her medications and appointments.  Reports that she no longer does any cooking because her sons cook.  She returns today for an evaluation.  HISTORY [name] is a 86 y.o. female here as requested by [name], MD for memory loss. PMHx HTN, headache, hypothyroid, MGUS, orthostatic hypotension. Patient here with son. She says she has memory loss. She lives with her son. He cares for his mom, it is a split level. She has been getting services from [facility] to help with management of her medication and other aspects of life. She can perform her own ADLs but difficulty with IADLs and medication management and getting lost recently. Starting to get lost driving, recommend driving. She says she noticed some short-term memory changes. She may say the same things over and over again, son denies any significant problems. She did get lost one day, son says this is nothing new but she says she had to stop at a store to think about how to get home. She doesn't drive as much as she used to. Her son drives her. Her mother had dementia in her 41s. They are not interested. Son thinks it has to do with hearing. Slowly progressive. No accidents at home.Son helps her with all the IADLs. Family provides most information.    Reviewed notes, labs and imaging from outside physicians, which showed: I reviewed [name] notes, she has been recently working with [facility] for medication compliance, assistance with chronic disease management, care coordination needs.  She is having difficulty managing her own medications. She administers her own meds, srives self to appointments, calls pharmacy for medication refills, performs ADLs independently. Diet was discussed. Barriers include Social isolation, memory deficits, lacks knowledge of signs ans symptoms of dementia and starting to get lost driving home. They encouraged son to attend appointment.   MRI brain 08/2018: personally reviewed and agree with the following: 1. No acute intracranial abnormality. 2. Mild for age signal changes compatible with chronic small vessel disease in the brain, stable to minimally progressed since 2016.  REVIEW OF SYSTEMS: Out of a complete 14 system review of symptoms, the patient complains only of the following symptoms, and all other reviewed systems are negative.  See HPI  ALLERGIES: Allergies  Allergen Reactions  . Iodine Other (See Comments)    Reaction unknown  . Sulfa Antibiotics Other (See Comments)    Childhood reaction    HOME MEDICATIONS: Outpatient Medications Prior to Visit  Medication Sig Dispense Refill  . amLODipine (NORVASC) 5 MG tablet TAKE 1 TABLET BY MOUTH EVERY DAY 90 tablet 1  . aspirin 81 MG tablet Take 81 mg by mouth daily.    . Cholecalciferol (VITAMIN D3) 125 MCG (5000 UT) CAPS Take 5,000 Units by mouth daily.     [name] EYSUVIS 0.25 % SUSP Apply to eye.    . fish oil-omega-3 fatty acids 1000 MG capsule Take 1 g by mouth daily.     . furosemide (LASIX) 40 MG tablet TAKE 1 TABLET (40 MG  TOTAL) BY MOUTH DAILY AS NEEDED FOR FLUID OR EDEMA. SHE FORGETS TO TAKE IT 90 tablet 0  . Melatonin 5 MG TABS Take 1 tablet by mouth at bedtime as needed.    . Multiple Vitamin (MULTIVITAMIN WITH MINERALS) TABS Take 1 tablet by mouth daily.     [name] senna (SENOKOT) 8.6 MG TABS tablet Take 1 tablet by mouth daily as needed for mild constipation.    [name] SYNTHROID 125 MCG tablet Take 1 tablet by mouth Monday - Sunday 90 tablet 1  . XIIDRA 5 % SOLN Apply to eye. 2 drops per  day     No facility-administered medications prior to visit.    PAST MEDICAL HISTORY: Past Medical History:  Diagnosis Date  . Headache 12/24/2013  . Headache(784.0)   . Hypertension    pcp  preston clark  . Hypothyroid   . MGUS (monoclonal gammopathy of unknown significance) 12/10/2013    PAST SURGICAL HISTORY: Past Surgical History:  Procedure Laterality Date  . ABDOMINAL HYSTERECTOMY    . bladder tac    . BREAST BIOPSY Right 04/2017  . CATARACT EXTRACTION W/PHACO  08/22/2012   Procedure: CATARACT EXTRACTION PHACO AND INTRAOCULAR LENS PLACEMENT (IOC);  Surgeon: Adonis Brook, MD;  Location: [facility];  Service: Ophthalmology;  Laterality: Left;  . CATARACT EXTRACTION W/PHACO Right 02/13/2013   Procedure: CATARACT EXTRACTION PHACO AND INTRAOCULAR LENS PLACEMENT (IOC);  Surgeon: Adonis Brook, MD;  Location: [facility];  Service: Ophthalmology;  Laterality: Right;    FAMILY HISTORY: Family History  Problem Relation Age of Onset  . Cancer Maternal Grandfather        stomach cancer  . Cancer Paternal Grandfather        stomach cancer  . Healthy Mother   . Dementia Mother        at 30-99 y.o  . Healthy Father     SOCIAL HISTORY: Social History   Socioeconomic History  . Marital status: Widowed    Spouse name: Not on file  . Number of children: 3  . Years of education: 17.5-18   . Highest education level: Bachelor's degree (e.g., BA, AB, BS)  Occupational History  . Occupation: retired  Tobacco Use  . Smoking status: Never Smoker  . Smokeless tobacco: Never Used  Vaping Use  . Vaping Use: Never used  Substance and Sexual Activity  . Alcohol use: Yes    Alcohol/week: 7.0 standard drinks    Types: 7 Shots of liquor per week  . Drug use: No  . Sexual activity: Not Currently  Other Topics Concern  . Not on file  Social History Narrative   Lives at home. Her sons live with her.    Right handed   Social Determinants of Health   Financial Resource Strain: Low Risk   .  Difficulty of Paying Living Expenses: Not hard at all  Food Insecurity: No Food Insecurity  . Worried About Charity fundraiser in the Last Year: Never true  . Ran Out of Food in the Last Year: Never true  Transportation Needs: No Transportation Needs  . Lack of Transportation (Medical): No  . Lack of Transportation (Non-Medical): No  Physical Activity: Sufficiently Active  . Days of Exercise per Week: 7 days  . Minutes of Exercise per Session: 30 min  Stress: No Stress Concern Present  . Feeling of Stress : Not at all  Social Connections:   . Frequency of Communication with Friends and Family:   . Frequency of Social Gatherings  with Friends and Family:   . Attends Religious Services:   . Active Member of Clubs or Organizations:   . Attends Archivist Meetings:   [name] Marital Status:   Intimate Partner Violence: Not At Risk  . Fear of Current or Ex-Partner: No  . Emotionally Abused: No  . Physically Abused: No  . Sexually Abused: No      PHYSICAL EXAM  Vitals:   04/23/20 1306  BP: (!) 155/81  Pulse: 71  Weight: 160 lb 6.4 oz (72.8 kg)  Height: 5\' 4"  (1.626 m)   Body mass index is 27.53 kg/m.  Generalized: Well developed, in no acute distress   Neurological examination  Mentation: Alert oriented to time, place, history taking. Follows all commands speech and language fluent Cranial nerve II-XII: Pupils were equal round reactive to light. Extraocular movements were full, visual field were full on confrontational test.  Head turning and shoulder shrug  were normal and symmetric. Motor: The motor testing reveals 5 over 5 strength of all 4 extremities. Good symmetric motor tone is noted throughout.  Sensory: Sensory testing is intact to soft touch on all 4 extremities. No evidence of extinction is noted.  Coordination: Cerebellar testing reveals good finger-nose-finger and heel-to-shin bilaterally.  Gait and station: Gait is normal.  Reflexes: Deep tendon reflexes  are symmetric and normal bilaterally.   DIAGNOSTIC DATA (LABS, IMAGING, TESTING) - I reviewed patient records, labs, notes, testing and imaging myself where available.  Lab Results  Component Value Date   WBC 4.4 11/13/2019   HGB 12.5 11/13/2019   HCT 39.3 11/13/2019   MCV 90.3 11/13/2019   PLT 169 11/13/2019      Component Value Date/Time   NA 142 02/19/2020 1639   NA 142 07/15/2014 0949   K 3.9 02/19/2020 1639   K 3.7 07/15/2014 0949   CL 99 02/19/2020 1639   CO2 24 02/19/2020 1639   CO2 29 07/15/2014 0949   GLUCOSE 104 (H) 02/19/2020 1639   GLUCOSE 93 11/13/2019 1507   GLUCOSE 98 07/15/2014 0949   BUN 22 02/19/2020 1639   BUN 19.2 07/15/2014 0949   CREATININE 1.17 (H) 02/19/2020 1639   CREATININE 0.92 11/13/2019 1507   CREATININE 1.0 07/15/2014 0949   CALCIUM 9.2 02/19/2020 1639   CALCIUM 9.5 07/15/2014 0949   PROT 7.8 11/13/2019 1507   PROT 7.1 10/22/2019 1225   PROT 7.7 07/15/2014 0949   ALBUMIN 4.0 11/13/2019 1507   ALBUMIN 4.1 09/25/2019 1631   ALBUMIN 3.6 07/15/2014 0949   AST 15 11/13/2019 1507   AST 16 07/15/2014 0949   ALT 12 11/13/2019 1507   ALT 14 07/15/2014 0949   ALKPHOS 66 11/13/2019 1507   ALKPHOS 67 07/15/2014 0949   BILITOT 0.3 11/13/2019 1507   BILITOT 0.28 07/15/2014 0949   GFRNONAA 43 (L) 02/19/2020 1639   GFRNONAA 57 (L) 11/13/2019 1507   GFRAA 49 (L) 02/19/2020 1639   GFRAA >60 11/13/2019 1507   Lab Results  Component Value Date   CHOL 220 (H) 12/12/2018   HDL 71 12/12/2018   LDLCALC 113 (H) 12/12/2018   TRIG 180 (H) 12/12/2018   CHOLHDL 3.1 12/12/2018   Lab Results  Component Value Date   HGBA1C (H) 04/16/2010    6.2 (NOTE)  According to the ADA Clinical Practice Recommendations for 2011, when HbA1c is used as a screening test:   >=6.5%   Diagnostic of Diabetes Mellitus           (if abnormal result  is confirmed)  5.7-6.4%   Increased risk of developing  Diabetes Mellitus  References:Diagnosis and Classification of Diabetes Mellitus,Diabetes WCBJ,6283,15(VVOHY 1):S62-S69 and Standards of Medical Care in         Diabetes - 2011,Diabetes WVPX,1062,69  (Suppl 1):S11-S61.   Lab Results  Component Value Date   SWNIOEVO35 009 12/12/2018   Lab Results  Component Value Date   TSH 0.444 (L) 02/19/2020      ASSESSMENT AND PLAN 86 y.o. year old female  has a past medical history of Headache (12/24/2013), Headache(784.0), Hypertension, Hypothyroid, and MGUS (monoclonal gammopathy of unknown significance) (12/10/2013). here with:  Memory disturbance   MMSE 27 out of 30 previously 21 out of 30  Memory stable  Encouraged to keep regular follow-ups with PCP  Follow-up on an as-needed basis   I spent 20 minutes of face-to-face and non-face-to-face time with patient.  This included previsit chart review, lab review, study review, order entry, electronic health record documentation, patient education.  Ward Givens, MSN, NP-C 04/23/2020, 1:07 PM [facility] [address], [place], [place] [address] [phone]  Made any corrections needed, and agree with history, physical, neuro exam,assessment and plan as stated.     Sarina Ill, MD [facility]

## 2020-04-23 NOTE — Patient Instructions (Signed)
Your Plan:  Continue to monitor symptoms  Memory score is better today  Thank you for coming to see [place] at [facility]. I hope we have been able to provide you high quality care today.  You may receive a patient satisfaction survey over the next few weeks. We would appreciate your feedback and comments so that we may continue to improve ourselves and the health of our patients.

## 2020-04-26 ENCOUNTER — Other Ambulatory Visit: Payer: Self-pay | Admitting: Internal Medicine

## 2020-04-29 ENCOUNTER — Telehealth: Payer: Self-pay

## 2020-05-04 DIAGNOSIS — H40052 Ocular hypertension, left eye: Secondary | ICD-10-CM | POA: Diagnosis not present

## 2020-05-08 ENCOUNTER — Telehealth: Payer: Self-pay | Admitting: Hematology and Oncology

## 2020-05-08 NOTE — Telephone Encounter (Signed)
Scheduled per 1/27 los now that MD template is open. Called and left msg. Mailed printout

## 2020-05-16 ENCOUNTER — Other Ambulatory Visit: Payer: Self-pay | Admitting: Internal Medicine

## 2020-06-01 ENCOUNTER — Ambulatory Visit: Payer: Medicare HMO | Admitting: Internal Medicine

## 2020-06-11 ENCOUNTER — Telehealth: Payer: Self-pay

## 2020-06-12 IMAGING — CT CT HEAD W/O CM
3 of 4 series · 15 of 47 positions shown, 18 images · non-contrast
Comparison: 03/10/2015.

Addendum:
CLINICAL DATA: Dizziness. Headache. Hallucinations.

EXAM:
CT HEAD WITHOUT CONTRAST
TECHNIQUE: Contiguous axial images were obtained from the base of the skull
through the vertex without intravenous contrast.

[Series 4: head 2.0 h70h · axial · 0.46mm/px · z∈[-173,-39]mm · 9 of 83 slices shown, 12 images]
[im 8/83  brain]
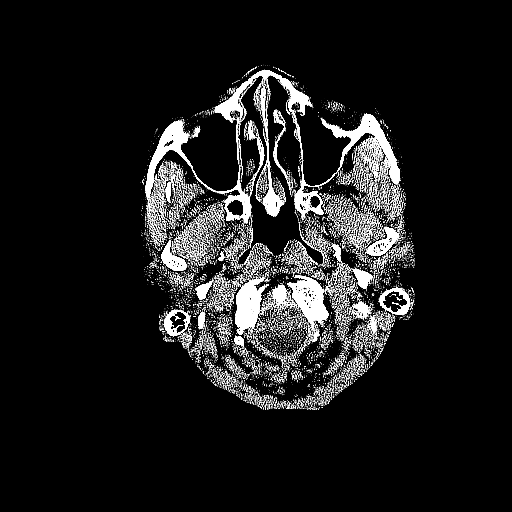
[im 8/83  bone]
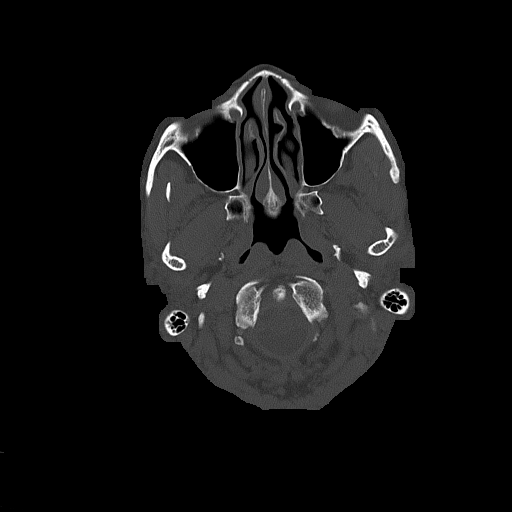
[im 16/83  brain]
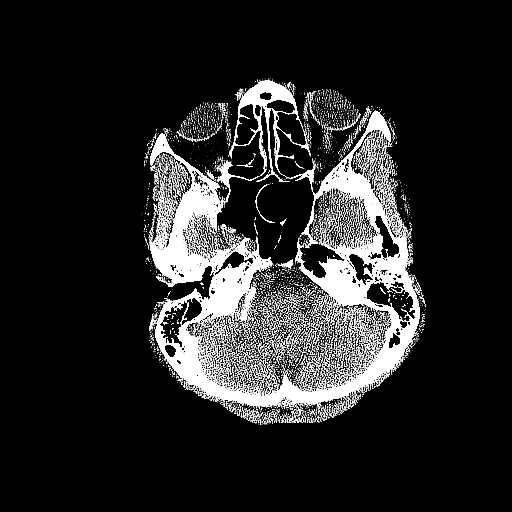
[im 24/83  brain]
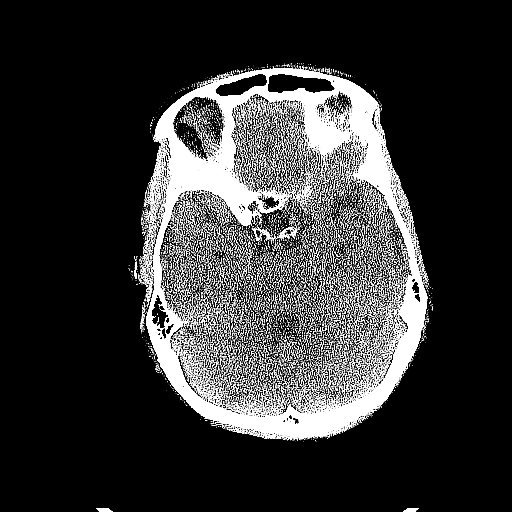
[im 32/83  brain]
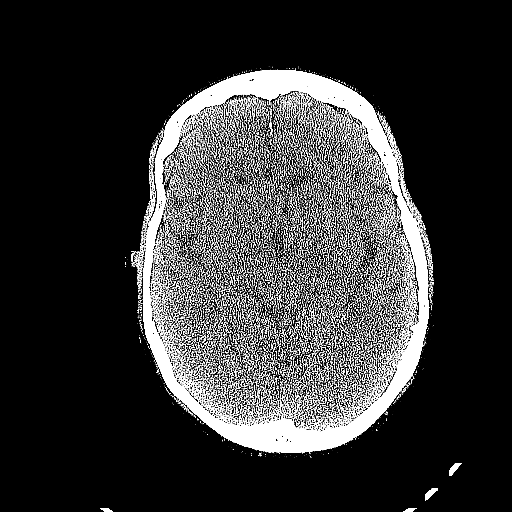
[im 43/83  brain]
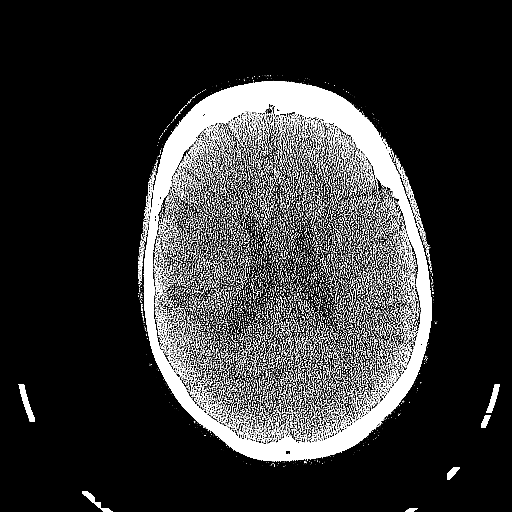
[im 43/83  bone]
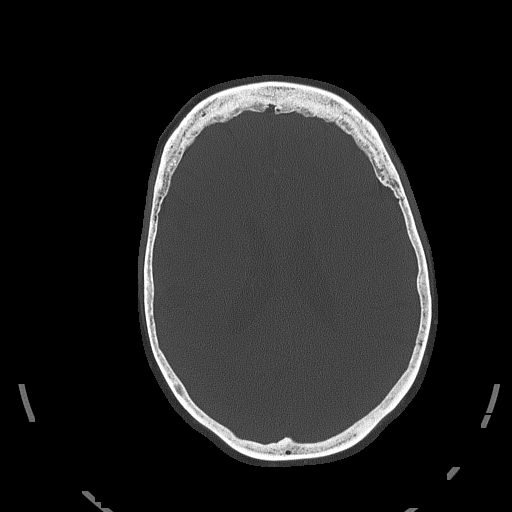
[im 51/83  brain]
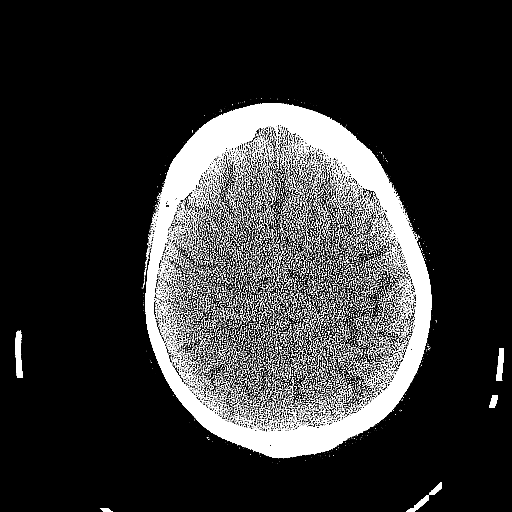
[im 59/83  brain]
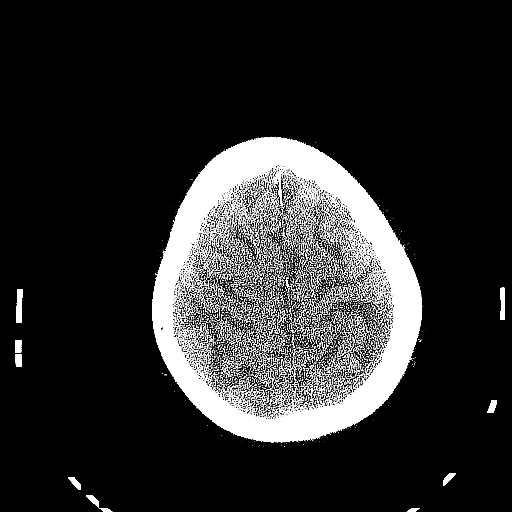
[im 67/83  brain]
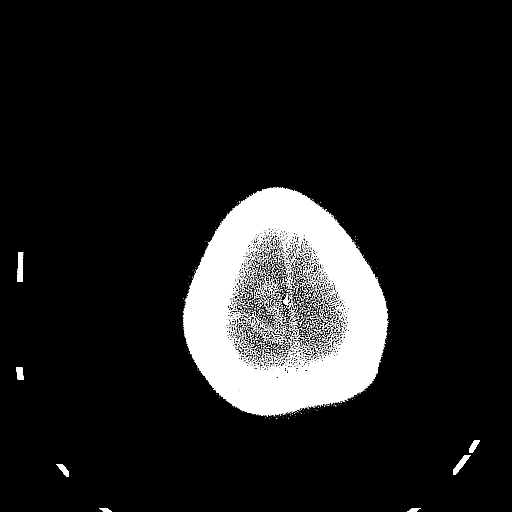
[im 75/83  brain]
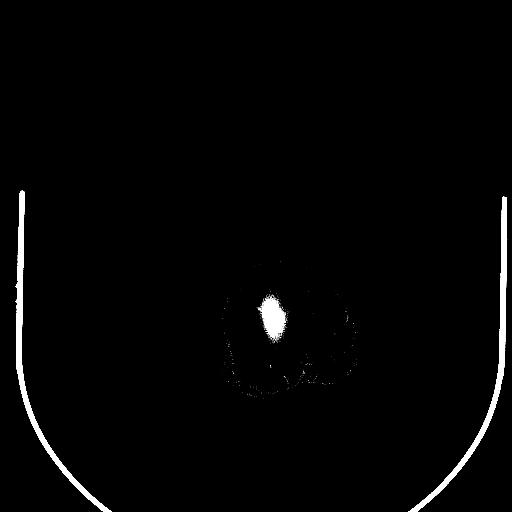
[im 75/83  bone]
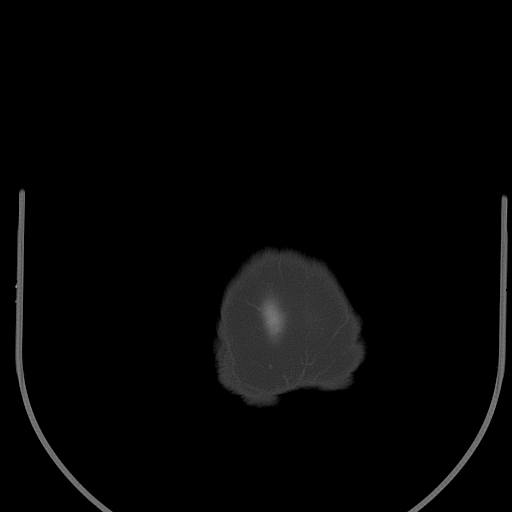

[Series 5: head 3.0 mpr cor · coronal · 0.32mm/px · 3 of 74 slices shown]
[im 25/74  brain]
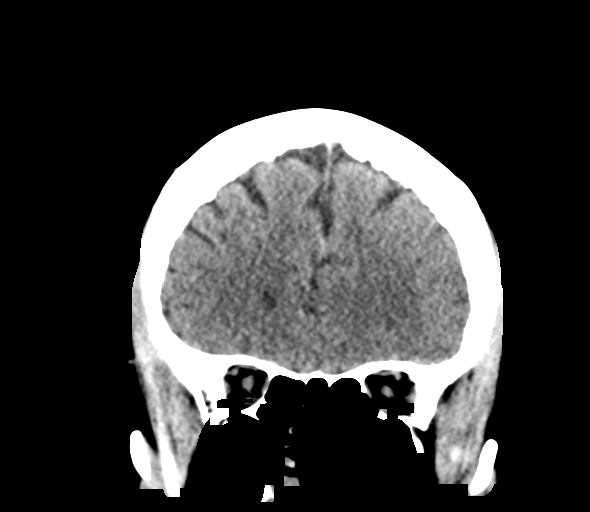
[im 33/74  brain]
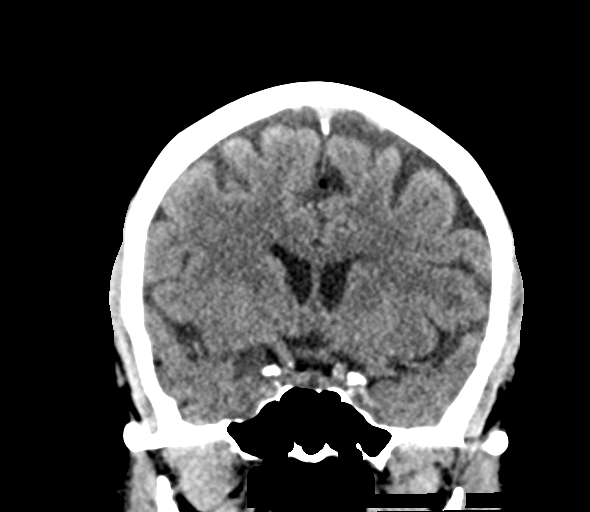
[im 41/74  brain]
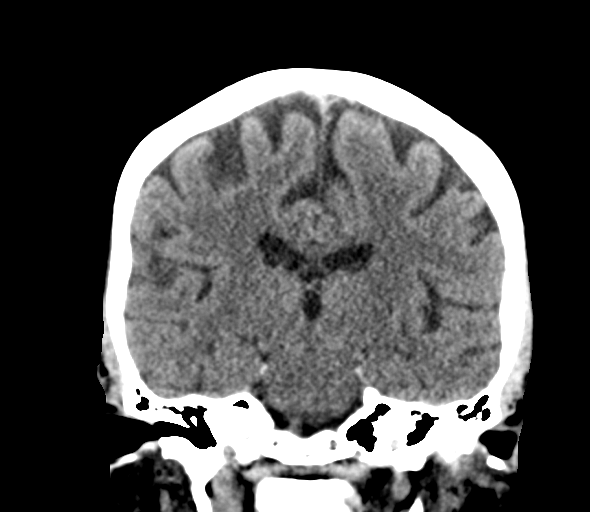

[Series 6: head 3.0 mpr sag · sagittal · 0.36mm/px · 3 of 66 slices shown]
[im 22/66  brain]
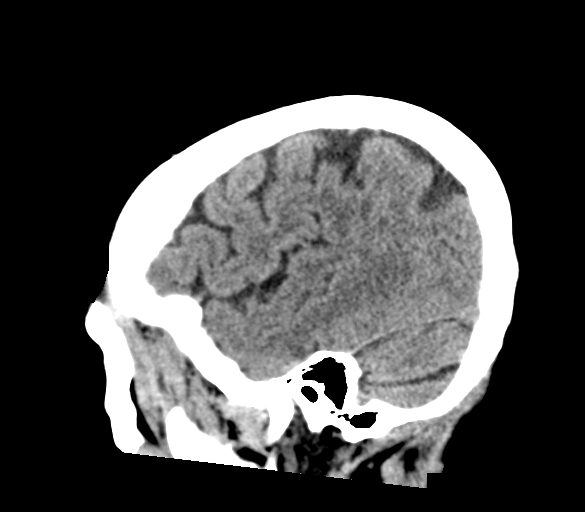
[im 33/66  brain]
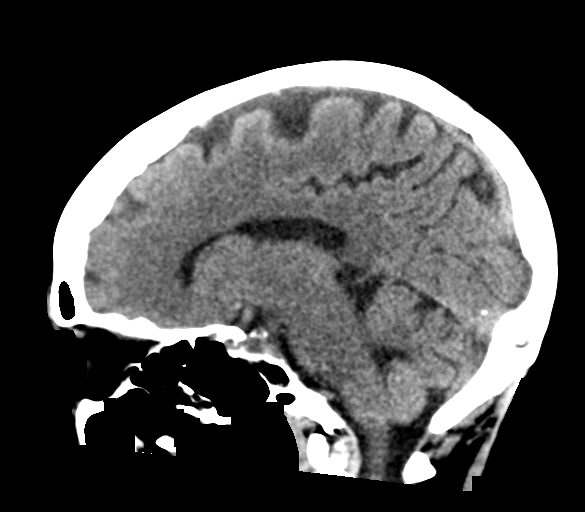
[im 44/66  brain]
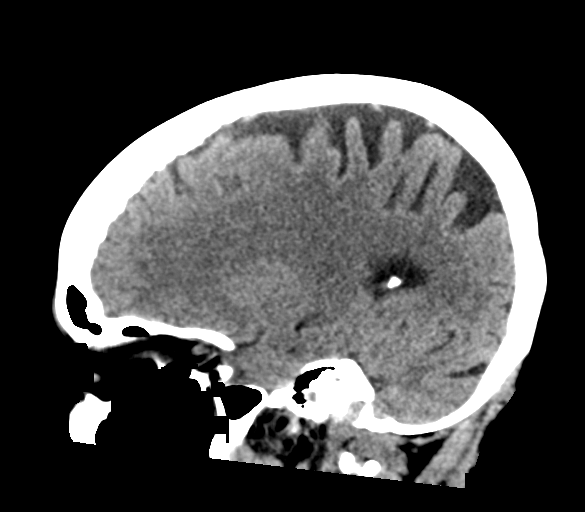

[15 of 47 positions shown; findings below may reference images not displayed]

FINDINGS: Brain: Minimally enlarged ventricles and subarachnoid spaces.
Minimal patchy white matter low density in both cerebral
hemispheres. No intracranial hemorrhage, mass lesion or CT evidence
of acute infarction.

Vascular: No hyperdense vessel or unexpected calcification.

Skull: Mild bilateral hyperostosis frontalis.

Sinuses/Orbits: Status post bilateral cataract extraction.
Unremarkable paranasal sinuses.

Other: None.
IMPRESSION: 1. No acute abnormality.
2. Minimal atrophy and minimal chronic small vessel white matter
ischemic changes in both cerebral hemispheres.

ADDENDUM:
Also noted are multiple small, superficial linear densities anterior
to the right ear, along the posterior margin of the subcutaneous fat
at the junction with the underlying musculature. These may represent
small surgical clips and are unchanged..

*** End of Addendum ***

## 2020-06-12 IMAGING — MR MR HEAD W/O CM
10 of 11 series · 42 of 48 positions shown · non-contrast
Comparison: Head CT without contrast earlier today. Brain MRI
03/11/2015 and earlier.

CLINICAL DATA: 85-year-old female with ataxia. Dizziness and
headache.

EXAM:
MRI HEAD WITHOUT CONTRAST
TECHNIQUE: Multiplanar, multiecho pulse sequences of the brain and surrounding
structures were obtained without intravenous contrast.

[Series 5: DWI · axial · 4.0mm · 0.88mm/px · z∈[-93,+43]mm · 8 of 80 slices shown (1 of 4)]
[im 1/80]
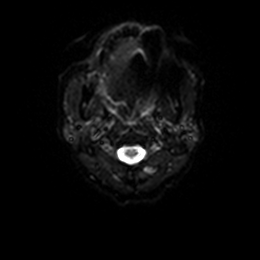
[im 12/80]
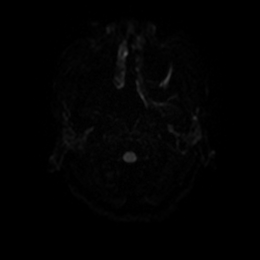
[im 23/80]
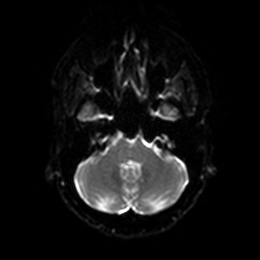
[im 34/80]
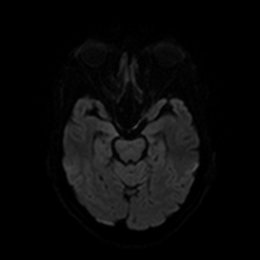
[im 46/80]
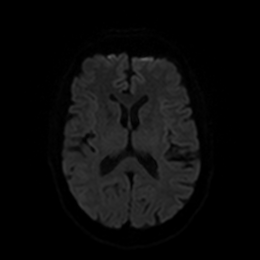
[im 57/80]
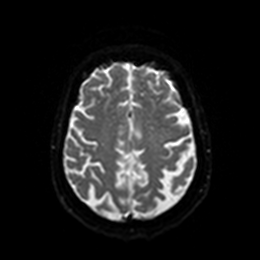
[im 68/80]
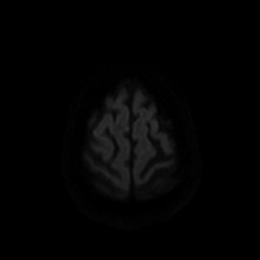
[im 80/80]
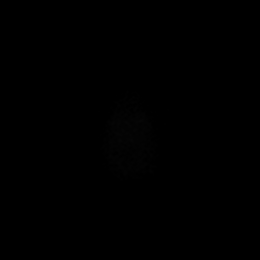

[Series 6: DWI · axial · 4.0mm · 0.88mm/px · z∈[-93,+43]mm · 4 of 40 slices shown (2 of 4)]
[im 1/40]
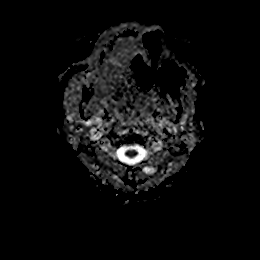
[im 14/40]
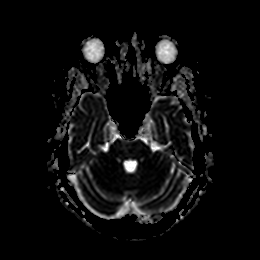
[im 27/40]
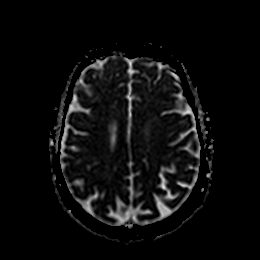
[im 40/40]
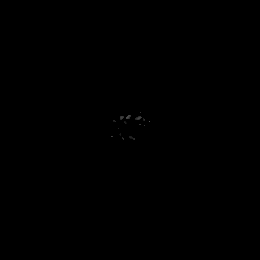

[Series 7: DWI · coronal · 4.0mm · 0.88mm/px · 7 of 72 slices shown (3 of 4)]
[im 1/72]
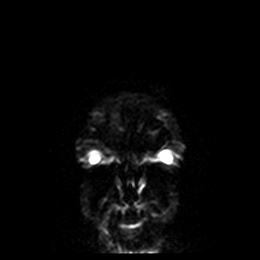
[im 12/72]
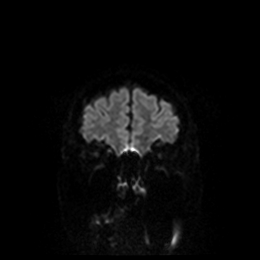
[im 24/72]
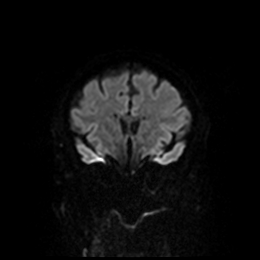
[im 36/72]
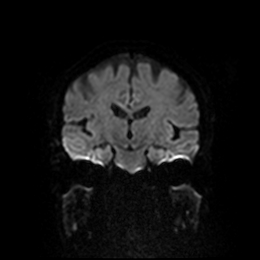
[im 48/72]
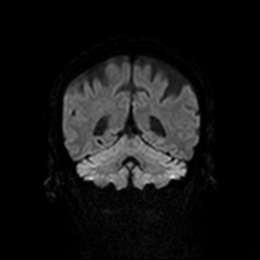
[im 60/72]
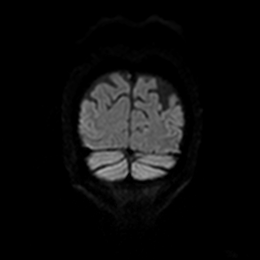
[im 72/72]
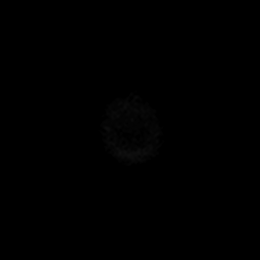

[Series 8: DWI · coronal · 4.0mm · 0.88mm/px · 3 of 36 slices shown (4 of 4)]
[im 1/36]
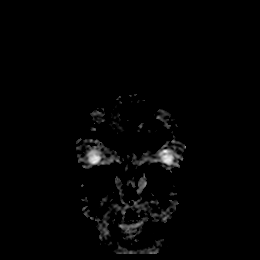
[im 18/36]
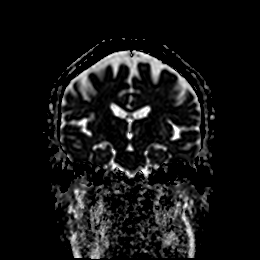
[im 36/36]
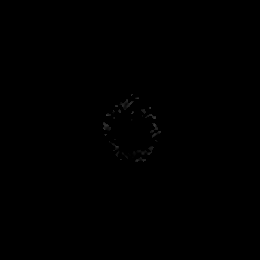

[Series 9: T1 · sagittal · 5.0mm · 0.75mm/px · 2 of 20 slices shown]
[im 1/20]
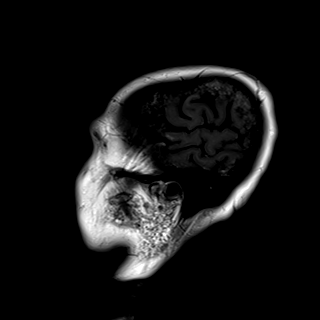
[im 20/20]
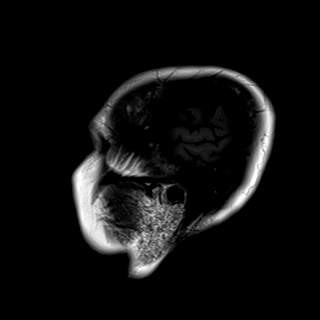

[Series 10: T2 · axial · 5.0mm · 0.72mm/px · z∈[-93,+32]mm · 2 of 25 slices shown (1 of 2)]
[im 1/25]
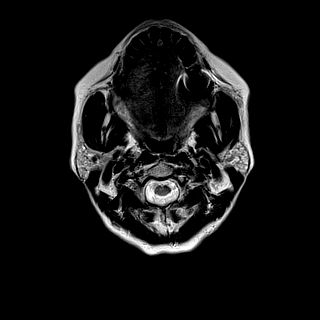
[im 25/25]
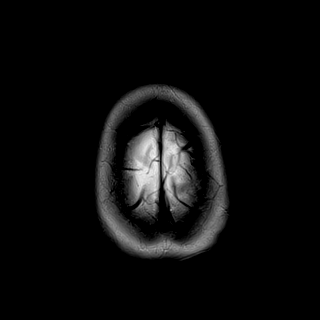

[Series 11: FLAIR · axial · 5.0mm · 0.45mm/px · z∈[-94,+31]mm · 2 of 25 slices shown]
[im 1/25]
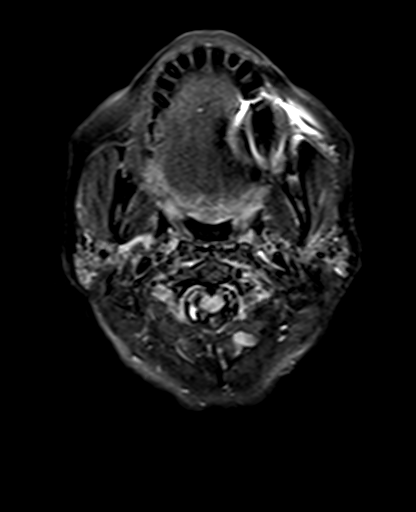
[im 25/25]
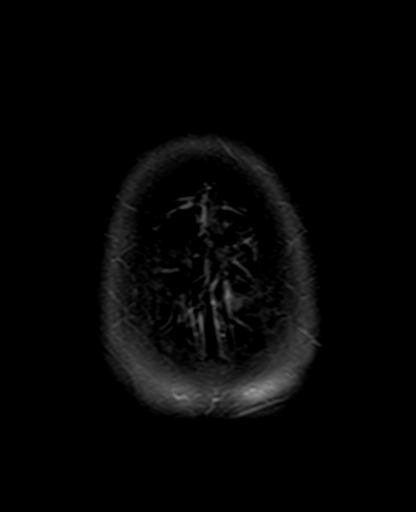

[Series 12: swi_images · axial · 3.0mm · 0.90mm/px · z∈[-102,+53]mm · 6 of 60 slices shown]
[im 1/60]
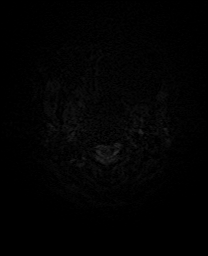
[im 12/60]
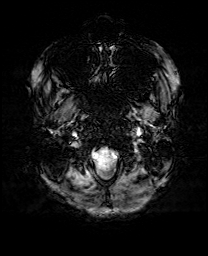
[im 24/60]
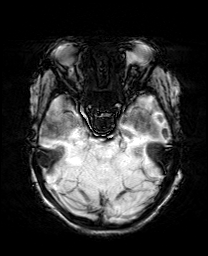
[im 36/60]
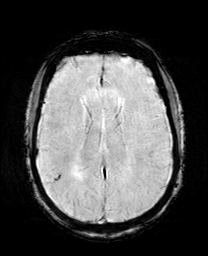
[im 48/60]
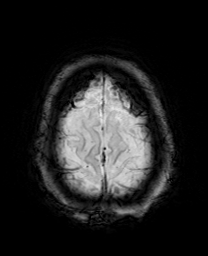
[im 60/60]
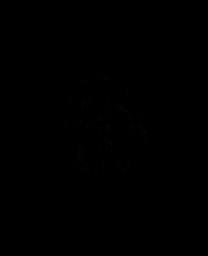

[Series 13: mip_images(sw) · axial · 24.0mm · 0.90mm/px · z∈[-92,+43]mm · 5 of 53 slices shown]
[im 1/53]
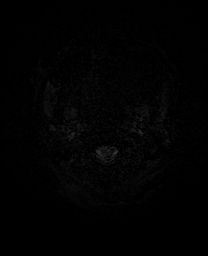
[im 14/53]
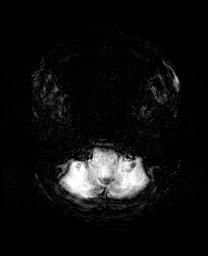
[im 27/53]
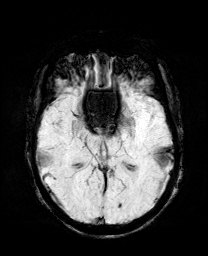
[im 40/53]
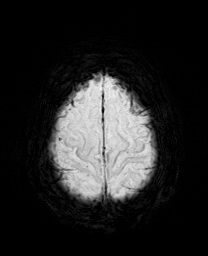
[im 53/53]
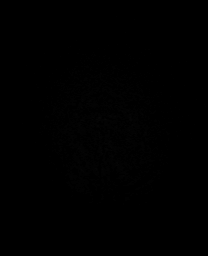

[Series 15: T2 · coronal · 5.0mm · 0.34mm/px · 3 of 29 slices shown (2 of 2)]
[im 1/29]
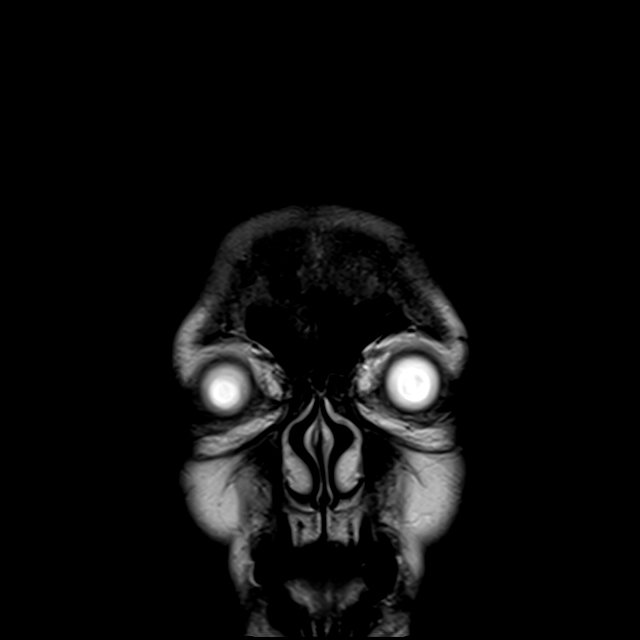
[im 15/29]
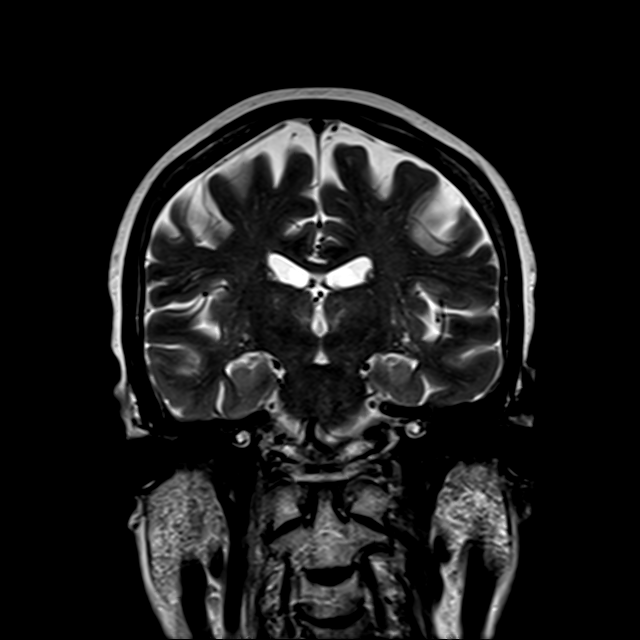
[im 29/29]
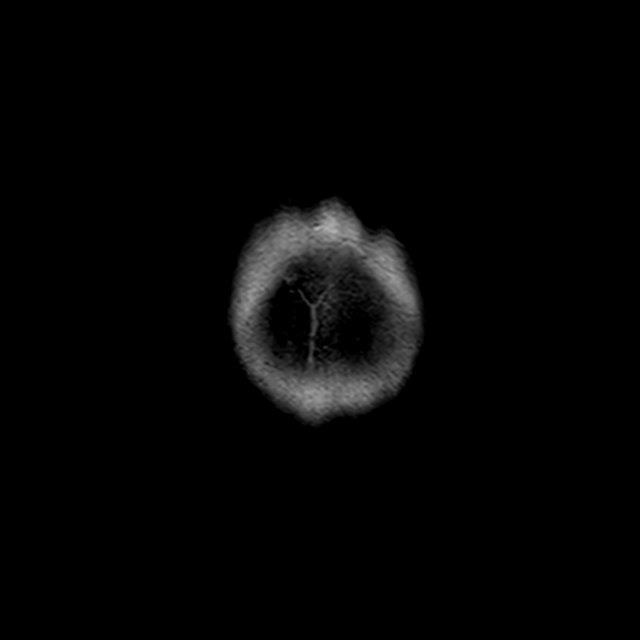

[42 of 48 positions shown; findings below may reference images not displayed]

FINDINGS: Brain: No restricted diffusion to suggest acute infarction. No
midline shift, mass effect, evidence of mass lesion,
ventriculomegaly, extra-axial collection or acute intracranial
hemorrhage. Cervicomedullary junction and pituitary are within
normal limits.

Continued mild for age nonspecific cerebral white matter T2 and
FLAIR hyperintensity. Patchy T2 hyperintensity in the pons has
mildly progressed since 2830, but also fairly mild for age along
with stable no cortical encephalomalacia. Stable chronic
microhemorrhage in the left occipital lobe. Cerebellum remains
normal for age. T2 heterogeneity in the deep gray matter nuclei

Vascular: Major intracranial vascular flow voids are stable since
2830.

Skull and upper cervical spine: Cervical spine degeneration without
visible spinal stenosis. Normal bone marrow signal.

Sinuses/Orbits: Stable and negative.

Other: Visible internal auditory structures appear grossly stable
and normal. Mastoids remain clear. Scalp and face soft tissues are
negative.
IMPRESSION: 1.  No acute intracranial abnormality.
2. Mild for age signal changes compatible with chronic small vessel
disease in the brain, stable to minimally progressed since [DATE].

## 2020-07-08 DIAGNOSIS — Z4689 Encounter for fitting and adjustment of other specified devices: Secondary | ICD-10-CM | POA: Diagnosis not present

## 2020-07-08 DIAGNOSIS — Z1231 Encounter for screening mammogram for malignant neoplasm of breast: Secondary | ICD-10-CM | POA: Diagnosis not present

## 2020-07-08 DIAGNOSIS — N8111 Cystocele, midline: Secondary | ICD-10-CM | POA: Diagnosis not present

## 2020-07-08 DIAGNOSIS — R350 Frequency of micturition: Secondary | ICD-10-CM | POA: Diagnosis not present

## 2020-07-08 DIAGNOSIS — Z124 Encounter for screening for malignant neoplasm of cervix: Secondary | ICD-10-CM | POA: Diagnosis not present

## 2020-07-20 ENCOUNTER — Ambulatory Visit: Payer: Self-pay

## 2020-07-20 ENCOUNTER — Other Ambulatory Visit: Payer: Self-pay

## 2020-07-20 ENCOUNTER — Telehealth: Payer: Medicare HMO

## 2020-07-20 DIAGNOSIS — H9190 Unspecified hearing loss, unspecified ear: Secondary | ICD-10-CM

## 2020-07-20 DIAGNOSIS — N182 Chronic kidney disease, stage 2 (mild): Secondary | ICD-10-CM

## 2020-07-20 DIAGNOSIS — I129 Hypertensive chronic kidney disease with stage 1 through stage 4 chronic kidney disease, or unspecified chronic kidney disease: Secondary | ICD-10-CM

## 2020-07-20 DIAGNOSIS — I10 Essential (primary) hypertension: Secondary | ICD-10-CM

## 2020-07-20 DIAGNOSIS — E039 Hypothyroidism, unspecified: Secondary | ICD-10-CM

## 2020-07-23 NOTE — Patient Instructions (Signed)
Visit Information  Goals Addressed      Patient Stated   .  "I still have a some problems with remembering things" (pt-stated)   On track     [place] (see longitudinal plan of care for additional care plan information)  Current Barriers:  [name] Knowledge Deficits related to disease process and Self Health management of Dementia . Chronic Disease Management support and education needs related to HTN, hearing loss, memory loss, hypothyroidism  Nurse Case Manager Clinical Goal(s):  . New 07/20/20 Over the next 90 days, patient will work with CCM RN CM and PCP to address needs related to disease education and support to improve Self Health management of Dementia  CCM RN CM Interventions:  07/20/20 call completed with patient  . Inter-disciplinary care team collaboration (see longitudinal plan of care) . Evaluation of current treatment plan related to memory loss and patient's adherence to plan as established by provider . Determined patient continues to have memory loss but continues to provide self care with the supervision and assistance of her sons . Determined patient completed Neuro consultation with [name] on 04/24/19 with the following OV note reviewed; ASSESSMENT AND PLAN 86 y.o. year old female  has a past medical history of Headache (12/24/2013), Headache(784.0), Hypertension, Hypothyroid, and MGUS (monoclonal gammopathy of unknown significance) (12/10/2013). here with: Memory disturbance  MMSE 27 out of 30 previously 21 out of 30  Memory stable  Encouraged to keep regular follow-ups with PCP  Follow-up on an as-needed basis . Re-enforced to patient the importance of keeping up with her calendar system to record all pertinent appointments and phone numbers/contact information  . Reviewed and discussed next f/u OV with [name] Cancer is scheduled for 09/24/20 @11 :00 AM  . Discussed plans with patient for ongoing care management follow up and provided patient with direct contact  information for care management team  Patient Self Care Activities:  . Self administers medications as prescribed . Attends all scheduled provider appointments . Calls pharmacy for medication refills . Performs ADL's independently . Performs IADL's independently . Calls provider office for new concerns or questions  Please see past updates related to this goal by clicking on the "Past Updates" button in the selected goal      .  "to keep my kidneys working better" (pt-stated)        [place] (see longitudinal plan of care for additional care plan information)  Current Barriers:  [name] Knowledge Deficits related to disease process and Self Health management of chronic kidney disease . Chronic Disease Management support and education needs related to HTN, hearing loss, memory loss, hypothyroidism, chronic kidney disease  Nurse Case Manager Clinical Goal(s):  [name] Over the next 180 days, patient will work with the CCM team and PCP to address needs related to disease education and support to improve Self health management of CKD  CCM RN CM Interventions:  07/20/20 call completed with patient  . Inter-disciplinary care team collaboration (see longitudinal plan of care) . Evaluation of current treatment plan related to CKD and patient's adherence to plan as established by provider. . Provided education to patient re: recent GFR; Educated on stages of CKD; Educated patient on the health benefits of drinking plenty of water to help improve renal function   . Discussed plans with patient for ongoing care management follow up and provided patient with direct contact information for care management team . Provided patient with printed educational materials related to stages of CKD  Patient Self Care Activities:  . Self administers medications as prescribed . Attends all scheduled provider appointments . Calls provider office for new concerns or questions . Supportive sons to assist with  care needs  Initial goal documentation       Patient verbalizes understanding of instructions provided today.   Telephone follow up appointment with care management team member scheduled for: 09/30/20  Barb Merino, RN, BSN, CCM Care Management Coordinator [place] [facility]  Direct Phone: [phone]

## 2020-07-23 NOTE — Chronic Care Management (AMB) (Signed)
Chronic Care Management   Follow Up Note   07/20/2020 Name: Brianna [name] MRN: [id number] DOB: 06/02/1933  Referred by: [name], MD Reason for referral : Chronic Care Management (CCM RN CM FU Call )   Brianna [name] is a 86 y.o. year old female who is a primary care patient of [name], MD. The CCM team was consulted for assistance with chronic disease management and care coordination needs.    Review of patient status, including review of consultants reports, relevant laboratory and other test results, and collaboration with appropriate care team members and the patient's provider was performed as part of comprehensive patient evaluation and provision of chronic care management services.    SDOH (Social Determinants of Health) assessments performed: Yes - no acute challenges identified at this time See Care Plan activities for detailed interventions related to [place])   Placed outbound CCM RN CM follow up call to patient for a care plan update.     Outpatient Encounter Medications as of 07/20/2020  Medication Sig  . amLODipine (NORVASC) 5 MG tablet TAKE 1 TABLET BY MOUTH EVERY DAY  . aspirin 81 MG tablet Take 81 mg by mouth daily.  . Cholecalciferol (VITAMIN D3) 125 MCG (5000 UT) CAPS Take 5,000 Units by mouth daily.   [name] EYSUVIS 0.25 % SUSP Apply to eye.  . fish oil-omega-3 fatty acids 1000 MG capsule Take 1 g by mouth daily.   . furosemide (LASIX) 40 MG tablet TAKE 1 TABLET (40 MG TOTAL) BY MOUTH DAILY AS NEEDED FOR FLUID OR EDEMA. SHE FORGETS TO TAKE IT  . Melatonin 5 MG TABS Take 1 tablet by mouth at bedtime as needed.  . Multiple Vitamin (MULTIVITAMIN WITH MINERALS) TABS Take 1 tablet by mouth daily.   [name] senna (SENOKOT) 8.6 MG TABS tablet Take 1 tablet by mouth daily as needed for mild constipation.  [name] SYNTHROID 125 MCG tablet TAKE 1 TABLET BY MOUTH MONDAY - SATURDAY  . XIIDRA 5 % SOLN Apply to eye. 2 drops per day   No facility-administered encounter  medications on file as of 07/20/2020.     Objective:  Lab Results  Component Value Date   HGBA1C (H) 04/16/2010    6.2 (NOTE)                                                                       According to the ADA Clinical Practice Recommendations for 2011, when HbA1c is used as a screening test:   >=6.5%   Diagnostic of Diabetes Mellitus           (if abnormal result  is confirmed)  5.7-6.4%   Increased risk of developing Diabetes Mellitus  References:Diagnosis and Classification of Diabetes Mellitus,Diabetes HCWC,3762,83(TDVVO 1):S62-S69 and Standards of Medical Care in         Diabetes - 2011,Diabetes Care,2011,34  (Suppl 1):S11-S61.   Lab Results  Component Value Date   MICROALBUR 30 09/18/2019   LDLCALC 113 (H) 12/12/2018   CREATININE 1.17 (H) 02/19/2020   BP Readings from Last 3 Encounters:  04/23/20 (!) 155/81  02/19/20 118/70  02/19/20 118/70    Goals Addressed      Patient Stated   .  "I still have a some problems  with remembering things" (pt-stated)   On track     [place] (see longitudinal plan of care for additional care plan information)  Current Barriers:  [name] Knowledge Deficits related to disease process and Self Health management of Dementia . Chronic Disease Management support and education needs related to HTN, hearing loss, memory loss, hypothyroidism  Nurse Case Manager Clinical Goal(s):  . New 07/20/20 Over the next 90 days, patient will work with CCM RN CM and PCP to address needs related to disease education and support to improve Self Health management of Dementia  CCM RN CM Interventions:  07/20/20 call completed with patient  . Inter-disciplinary care team collaboration (see longitudinal plan of care) . Evaluation of current treatment plan related to memory loss and patient's adherence to plan as established by provider . Determined patient continues to have memory loss but continues to provide self care with the supervision and assistance of  her sons . Determined patient completed Neuro consultation with [name] on 04/24/19 with the following OV note reviewed; ASSESSMENT AND PLAN 86 y.o. year old female  has a past medical history of Headache (12/24/2013), Headache(784.0), Hypertension, Hypothyroid, and MGUS (monoclonal gammopathy of unknown significance) (12/10/2013). here with: Memory disturbance  MMSE 27 out of 30 previously 21 out of 30  Memory stable  Encouraged to keep regular follow-ups with PCP  Follow-up on an as-needed basis . Re-enforced to patient the importance of keeping up with her calendar system to record all pertinent appointments and phone numbers/contact information  . Reviewed and discussed next f/u OV with [name] Cancer is scheduled for 09/24/20 @11 :00 AM  . Discussed plans with patient for ongoing care management follow up and provided patient with direct contact information for care management team  Patient Self Care Activities:  . Self administers medications as prescribed . Attends all scheduled provider appointments . Calls pharmacy for medication refills . Performs ADL's independently . Performs IADL's independently . Calls provider office for new concerns or questions  Please see past updates related to this goal by clicking on the "Past Updates" button in the selected goal      .  "to keep my kidneys working better" (pt-stated)        [name] (see longitudinal plan of care for additional care plan information)  Current Barriers:  [name] Knowledge Deficits related to disease process and Self Health management of chronic kidney disease . Chronic Disease Management support and education needs related to HTN, hearing loss, memory loss, hypothyroidism, chronic kidney disease  Nurse Case Manager Clinical Goal(s):  [name] Over the next 180 days, patient will work with the CCM team and PCP to address needs related to disease education and support to improve Self health management of CKD  CCM RN CM  Interventions:  07/20/20 call completed with patient  . Inter-disciplinary care team collaboration (see longitudinal plan of care) . Evaluation of current treatment plan related to CKD and patient's adherence to plan as established by provider. . Provided education to patient re: recent GFR; Educated on stages of CKD; Educated patient on the health benefits of drinking plenty of water to help improve renal function   . Discussed plans with patient for ongoing care management follow up and provided patient with direct contact information for care management team . Provided patient with printed educational materials related to stages of CKD   Patient Self Care Activities:  . Self administers medications as prescribed . Attends all scheduled provider appointments . Calls provider office  for new concerns or questions . Supportive sons to assist with care needs  Initial goal documentation      Plan:   Telephone follow up appointment with care management team member scheduled for: 09/28/20  Barb Merino, RN, BSN, CCM Care Management Coordinator [place] [facility]  Direct Phone: [phone]

## 2020-08-19 ENCOUNTER — Telehealth: Payer: Self-pay

## 2020-08-19 ENCOUNTER — Telehealth: Payer: Medicare HMO

## 2020-08-19 NOTE — Telephone Encounter (Signed)
°  Chronic Care Management   Outreach Note  08/19/2020 Name: Brianna [name] MRN: [id number] DOB: 27-Jun-1933  Referred by: [name], MD Reason for referral : Care Coordination   An unsuccessful telephone outreach was attempted today. The patient was referred to the case management team for assistance with care management and care coordination.   Follow Up Plan: The care management team will reach out to the patient again over the next 28 days.   Daneen Schick, BSW, CDP Social Worker, Certified Dementia Practitioner [place] [facility] [place] [facility] [phone]

## 2020-09-15 ENCOUNTER — Telehealth: Payer: Medicare HMO

## 2020-09-15 ENCOUNTER — Telehealth: Payer: Self-pay

## 2020-09-15 NOTE — Telephone Encounter (Signed)
°  Chronic Care Management   Outreach Note  09/15/2020 Name: Brianna [name] MRN: [id number] DOB: 1933-09-18  Referred by: [name], MD Reason for referral : Care Coordination   A second unsuccessful telephone outreach was attempted today. The patient was referred to the case management team for assistance with care management and care coordination. SW attempted to contact the patient x 3 today with all calls being unsuccessful due to phone being busy.  Follow Up Plan: The care management team will reach out to the patient again over the next 21 days.   Daneen Schick, BSW, CDP Social Worker, Certified Dementia Practitioner [place] [facility] [place] [facility] [phone]

## 2020-09-22 ENCOUNTER — Ambulatory Visit: Payer: Medicare HMO

## 2020-09-22 ENCOUNTER — Ambulatory Visit: Payer: Medicare HMO | Admitting: Internal Medicine

## 2020-09-24 ENCOUNTER — Ambulatory Visit: Payer: Medicare HMO

## 2020-09-24 ENCOUNTER — Ambulatory Visit: Payer: Medicare HMO | Admitting: Internal Medicine

## 2020-09-28 ENCOUNTER — Telehealth: Payer: Self-pay

## 2020-09-28 ENCOUNTER — Telehealth: Payer: Medicare HMO

## 2020-09-28 NOTE — Telephone Encounter (Cosign Needed)
  Chronic Care Management   Outreach Note  09/28/2020 Name: Brianna [name] MRN: [id number] DOB: 1932/10/29  Referred by: [name], MD Reason for referral : Chronic Care Management (RN CM FU Call )   [name] is enrolled in a Managed Medicaid Health Plan: No  An unsuccessful telephone outreach was attempted today. The patient was referred to the case management team for assistance with care management and care coordination.   Follow Up Plan: Telephone follow up appointment with care management team member scheduled for: 11/04/20  Barb Merino, RN, BSN, CCM Care Management Coordinator [place] [facility]  Direct Phone: [phone]

## 2020-10-01 ENCOUNTER — Telehealth: Payer: Medicare HMO

## 2020-10-13 ENCOUNTER — Telehealth: Payer: Self-pay | Admitting: Internal Medicine

## 2020-10-13 NOTE — Telephone Encounter (Signed)
Tried calling pt to  schedule Medicare Annual Wellness Visit (AWV) in office.  No answer  Last AWV 5/5/21please schedule at anytime with [facility]   Can schedule early  Calender year  [employer] medicare   This should be a 45 minute visit.

## 2020-10-15 ENCOUNTER — Telehealth: Payer: Self-pay

## 2020-10-15 ENCOUNTER — Telehealth: Payer: Medicare HMO

## 2020-10-15 NOTE — Telephone Encounter (Signed)
  Chronic Care Management   Outreach Note  10/15/2020 Name: Brianna [name] MRN: [id number] DOB: 1932/10/27  Referred by: Dorothyann Peng, MD Reason for referral : Care Coordination   N[place] Guard Balik is enrolled in a Managed Medicaid Health Plan: No  SW placed a successful outbound call to the patient to assess for care coordination needs. Patient reports she is unavailable to complete today's call and requests a call at a later time.  Follow Up Plan: The care management team will reach out to the patient again over the next 21 days.   Bevelyn Ngo, BSW, CDP Social Worker, Certified Dementia Practitioner [facility] Management [phone]

## 2020-10-27 DIAGNOSIS — N8111 Cystocele, midline: Secondary | ICD-10-CM | POA: Diagnosis not present

## 2020-10-27 DIAGNOSIS — Z4689 Encounter for fitting and adjustment of other specified devices: Secondary | ICD-10-CM | POA: Diagnosis not present

## 2020-11-04 ENCOUNTER — Telehealth: Payer: Medicare HMO

## 2020-11-04 ENCOUNTER — Other Ambulatory Visit: Payer: Self-pay

## 2020-11-04 ENCOUNTER — Ambulatory Visit: Payer: Self-pay

## 2020-11-04 DIAGNOSIS — I10 Essential (primary) hypertension: Secondary | ICD-10-CM

## 2020-11-04 DIAGNOSIS — R413 Other amnesia: Secondary | ICD-10-CM

## 2020-11-04 DIAGNOSIS — E039 Hypothyroidism, unspecified: Secondary | ICD-10-CM

## 2020-11-04 DIAGNOSIS — N182 Chronic kidney disease, stage 2 (mild): Secondary | ICD-10-CM

## 2020-11-04 DIAGNOSIS — H9190 Unspecified hearing loss, unspecified ear: Secondary | ICD-10-CM

## 2020-11-04 NOTE — Patient Instructions (Signed)
Visit Information  Other   .  Maintain Optimal Cognitive Function      Timeframe:  Long-Range Goal Priority:  High Start Date: 11/04/20                            Expected End Date:  02/02/21       Follow Up Date   01/06/21  Over the next 90 days, patient will  -contact Neurologist to schedule a follow up appointment for re-evaluation and treatment of dementia  -allow family to assist with care needs  -continue to utilize a calendar system to help with appointment reminders -continue to allow son to drive you to your appointments and help run errands                    .  Maintain Social and Functional Ability      Timeframe:  Long-Range Goal Priority:  High Start Date:  11/04/20                           Expected End Date:  02/02/21  Follow up date: 01/06/21     Over the next 90 days, patient will -maintain or improve functional and social abilities   -follow MD recommendations for ongoing treatment of dementia -keep all MD follow up appointments                      .  Make and Keep All Appointments      Timeframe:  Long-Range Goal Priority:  High Start Date: 11/04/20                            Expected End Date:  02/02/21                     Follow Up Date 01/06/21   - ask family or friend for a ride - call to cancel if needed - keep a calendar with prescription refill dates - keep a calendar with appointment dates    Why is this important?    Part of staying healthy is seeing the doctor for follow-up care.   If you forget your appointments, there are some things you can do to stay on track.    Notes:     [name]  [place] My Quality of Life      Timeframe:  Long-Range Goal Priority:  High Start Date:  11/04/20                           Expected End Date:  02/02/21                    Follow Up Date  01/06/21   - check out options for in-home help, long-term care or hospice - discuss my treatment options with the doctor or nurse - make shared treatment decisions with  doctor    Why is this important?    Having a long-term illness can be scary.   It can also be stressful for you and your caregiver.   These steps may help.    Notes:        Patient Care Plan: General Plan of Care (Adult)    Problem Identified: Quality of Life (General Plan of Care)   Priority: High    Long-Range  Goal: Quality of Life Maintained   Start Date: 11/04/2020  Expected End Date: 02/02/2021  This Visit's Progress: On track  Priority: High  Note:   Current Barriers:   Ineffective Self Health Maintenance  Unable to self administer medications as prescribed  Lacks social connections  Does not maintain contact with provider office  Currently UNABLE TO independently self manage needs related to chronic health conditions.   Knowledge Deficits related to short term plan for care coordination needs and long term plans for chronic disease management needs Case Manager Clinical Goal(s):  [name] Collaboration with [name], MD regarding development and update of comprehensive plan of care as evidenced by provider attestation and co-signature . Inter-disciplinary care team collaboration (see longitudinal plan of care)  Over the next 90 days, patient will work with care management team to address care coordination and chronic disease management needs related to Disease Management  Educational Needs  Care Coordination  Medication Management and Education  Psychosocial Support  Dementia and Caregiver Support   Interventions:   Assess and monitor for signs/symptoms of psychosocial concerns, especially depression or ideations regarding harm to others or self; provide or refer for mental health services as needed.   Identify sensory issues that impact quality of life such as hearing loss, vision deficit; strategize ways to maintain or improve hearing, vision.   Advocate for the development of palliative care plan.    Discussed plans with patient for ongoing care  management follow up and provided patient with direct contact information for care management team Patient Goals/Self-Care Activities:  Over the next 90 days, patient will - ask family or friend for a ride - call to cancel if needed - keep a calendar with prescription refill dates - keep a calendar with appointment dates - check out options for in-home help, long-term care or hospice - discuss my treatment options with the doctor or nurse - make shared treatment decisions with doctor  Follow Up Plan: Telephone follow up appointment with care management team member scheduled for: 01/06/21   Patient Care Plan: Dementia (Adult)    Problem Identified: Cognitive Function   Priority: High    Long-Range Goal: Optimal Cognitive Function   Start Date: 11/04/2020  Expected End Date: 02/02/2021  This Visit's Progress: On track  Priority: High  Note:   Current Barriers:   Ineffective Self Health Maintenance  Unable to self administer medications as prescribed  Lacks social connections  Does not contact provider office for questions/concerns  Currently UNABLE TO independently self manage needs related to chronic health conditions.   Knowledge Deficits related to short term plan for care coordination needs and long term plans for chronic disease management needs Case Manager Clinical Goal(s):  [name] Collaboration with [name], MD regarding development and update of comprehensive plan of care as evidenced by provider attestation and co-signature . Inter-disciplinary care team collaboration (see longitudinal plan of care)  Over the next 90 days, patient will work with care management team to address care coordination and chronic disease management needs related to Disease Management  Educational Needs  Care Coordination  Medication Management and Education  Psychosocial Support  Dementia and Caregiver Support   Interventions:   Encourage social relationships and physical  activity based on ability.   Promote strategies that will preserve the individual's abilities; focus on strengths and quality of life optimization.   Discussed plans with patient for ongoing care management follow up and provided patient with direct contact information for care management team Patient Goals/Self-Care  Activities:  Over the next 90 days, patient will  -contact Neurologist to schedule a follow up appointment for re-evaluation and treatment of dementia  -allow family to assist with care needs  -continue to utilize a calendar system to help with appointment reminders -continue to allow son to drive you to your appointments and help run errands   Follow Up Plan: Telephone follow up appointment with care management team member scheduled for: 01/06/21   Problem Identified: Social and Functional Symptoms   Priority: High    Long-Range Goal: Social and Functional Skills Optimized   Start Date: 11/04/2020  Expected End Date: 02/02/2021  This Visit's Progress: On track  Priority: High  Note:   Current Barriers:   Ineffective Self Health Maintenance  Unable to self administer medications as prescribed  Lacks social connections  Does not contact provider office for questions/concerns  Currently UNABLE TO independently self manage needs related to chronic health conditions.   Knowledge Deficits related to short term plan for care coordination needs and long term plans for chronic disease management needs Case Manager Clinical Goal(s):  [name] Collaboration with [name], MD regarding development and update of comprehensive plan of care as evidenced by provider attestation and co-signature . Inter-disciplinary care team collaboration (see longitudinal plan of care)  Over the next 90 days, patient will work with care management team to address care coordination and chronic disease management needs related to Disease Management  Educational Needs  Care  Coordination  Medication Management and Education  Psychosocial Support  Dementia and Caregiver Support   Interventions:   Assessed level of social support; promoted maintaining links with family, friends and community to reduce social isolation.   Assessed level of function related to basic activities of daily living that include eating, dressing, bathing, as well as instrumental activities of daily living such as shopping, managing finances and use of devices.   Encouraged continuation of daily life components such as self-care, home maintenance, financial management, volunteer activities, education opportunities and hobbies.   Discussed plans with patient for ongoing care management follow up and provided patient with direct contact information for care management team Patient Goal/Self-Care Activities:  Over the next 90 days, patient will -maintain or improve functional and social abilities   -follow MD recommendations for ongoing treatment of dementia -keep all MD follow up appointments   Follow Up Plan: Telephone follow up appointment with care management team member scheduled for: 01/06/21     The patient verbalized understanding of instructions, educational materials, and care plan provided today and declined offer to receive copy of patient instructions, educational materials, and care plan.   Telephone follow up appointment with care management team member scheduled for: 01/06/21  Lynne Logan, RN

## 2020-11-04 NOTE — Chronic Care Management (AMB) (Signed)
Chronic Care Management   CCM RN Visit Note  11/04/2020 Name: Brianna [name] MRN: [id number] DOB: 05/19/1933  Subjective: Brianna [name] is a 87 y.o. year old female who is a primary care patient of [name], MD. The care management team was consulted for assistance with disease management and care coordination needs.    Engaged with patient by telephone for follow up visit in response to provider referral for case management and/or care coordination services.   Consent to Services:  The patient was given information about Chronic Care Management services, agreed to services, and gave verbal consent prior to initiation of services.  Please see initial visit note for detailed documentation.   Patient agreed to services and verbal consent obtained.   Assessment: Review of patient past medical history, allergies, medications, health status, including review of consultants reports, laboratory and other test data, was performed as part of comprehensive evaluation and provision of chronic care management services.   SDOH (Social Determinants of Health) assessments and interventions performed:  yes, no acute needs  CCM Care Plan  Allergies  Allergen Reactions  . Iodine Other (See Comments)    Reaction unknown  . Sulfa Antibiotics Other (See Comments)    Childhood reaction    Outpatient Encounter Medications as of 11/04/2020  Medication Sig  . amLODipine (NORVASC) 5 MG tablet TAKE 1 TABLET BY MOUTH EVERY DAY  . aspirin 81 MG tablet Take 81 mg by mouth daily.  . Cholecalciferol (VITAMIN D3) 125 MCG (5000 UT) CAPS Take 5,000 Units by mouth daily.   [name] EYSUVIS 0.25 % SUSP Apply to eye.  . fish oil-omega-3 fatty acids 1000 MG capsule Take 1 g by mouth daily.   . furosemide (LASIX) 40 MG tablet TAKE 1 TABLET (40 MG TOTAL) BY MOUTH DAILY AS NEEDED FOR FLUID OR EDEMA. SHE FORGETS TO TAKE IT  . Melatonin 5 MG TABS Take 1 tablet by mouth at bedtime as needed.  . Multiple Vitamin  (MULTIVITAMIN WITH MINERALS) TABS Take 1 tablet by mouth daily.   [name] senna (SENOKOT) 8.6 MG TABS tablet Take 1 tablet by mouth daily as needed for mild constipation.  [name] SYNTHROID 125 MCG tablet TAKE 1 TABLET BY MOUTH MONDAY - SATURDAY  . XIIDRA 5 % SOLN Apply to eye. 2 drops per day   No facility-administered encounter medications on file as of 11/04/2020.    Patient Active Problem List   Diagnosis Date Noted  . Overweight with body mass index (BMI) of 27 to 27.9 in adult 02/24/2020  . Primary hypothyroidism 02/24/2020  . Chronic renal disease, stage II 02/24/2020  . Hypertensive nephropathy 02/24/2020  . Cellulitis and abscess of right lower extremity 07/16/2019  . Edema of both lower extremities 07/16/2019  . MCI (mild cognitive impairment) 04/18/2019  . Chronic headache 07/24/2014  . Essential hypertension 12/24/2013  . MGUS (monoclonal gammopathy of unknown significance) 12/10/2013    Conditions to be addressed/monitored:HTN, hearing loss, memory loss, hypothyroidism, chronic renal disease   Care Plan : General Plan of Care (Adult)  Updates made by Lynne Logan, RN since 11/04/2020 12:00 AM    Problem: Quality of Life (General Plan of Care)   Priority: High    Long-Range Goal: Quality of Life Maintained   Start Date: 11/04/2020  Expected End Date: 02/02/2021  This Visit's Progress: On track  Priority: High  Note:   Current Barriers:   Ineffective Self Health Maintenance  Unable to self administer medications as prescribed  Lacks social connections  Does not maintain contact with provider office  Currently UNABLE TO independently self manage needs related to chronic health conditions.   Knowledge Deficits related to short term plan for care coordination needs and long term plans for chronic disease management needs Case Manager Clinical Goal(s):  [name] Collaboration with [name], MD regarding development and update of comprehensive plan of care as evidenced by  provider attestation and co-signature . Inter-disciplinary care team collaboration (see longitudinal plan of care)  Over the next 90 days, patient will work with care management team to address care coordination and chronic disease management needs related to Disease Management  Educational Needs  Care Coordination  Medication Management and Education  Psychosocial Support  Dementia and Caregiver Support   Interventions:   Assess and monitor for signs/symptoms of psychosocial concerns, especially depression or ideations regarding harm to others or self; provide or refer for mental health services as needed.   Identify sensory issues that impact quality of life such as hearing loss, vision deficit; strategize ways to maintain or improve hearing, vision.   Advocate for the development of palliative care plan.    Discussed plans with patient for ongoing care management follow up and provided patient with direct contact information for care management team Patient Goals/Self-Care Activities:  Over the next 90 days, patient will - ask family or friend for a ride - call to cancel if needed - keep a calendar with prescription refill dates - keep a calendar with appointment dates - check out options for in-home help, long-term care or hospice - discuss my treatment options with the doctor or nurse - make shared treatment decisions with doctor  Follow Up Plan: Telephone follow up appointment with care management team member scheduled for: 01/06/21   Care Plan : Dementia (Adult)  Updates made by Lynne Logan, RN since 11/04/2020 12:00 AM    Problem: Cognitive Function   Priority: High    Long-Range Goal: Optimal Cognitive Function   Start Date: 11/04/2020  Expected End Date: 02/02/2021  This Visit's Progress: On track  Priority: High  Note:   Current Barriers:   Ineffective Self Health Maintenance  Unable to self administer medications as prescribed  Lacks social  connections  Does not contact provider office for questions/concerns  Currently UNABLE TO independently self manage needs related to chronic health conditions.   Knowledge Deficits related to short term plan for care coordination needs and long term plans for chronic disease management needs Case Manager Clinical Goal(s):  [name] Collaboration with [name], MD regarding development and update of comprehensive plan of care as evidenced by provider attestation and co-signature . Inter-disciplinary care team collaboration (see longitudinal plan of care)  Over the next 90 days, patient will work with care management team to address care coordination and chronic disease management needs related to Disease Management  Educational Needs  Care Coordination  Medication Management and Education  Psychosocial Support  Dementia and Caregiver Support   Interventions:   Encourage social relationships and physical activity based on ability.   Promote strategies that will preserve the individual's abilities; focus on strengths and quality of life optimization.   Discussed plans with patient for ongoing care management follow up and provided patient with direct contact information for care management team Patient Goals/Self-Care Activities:  Over the next 90 days, patient will  -contact Neurologist to schedule a follow up appointment for re-evaluation and treatment of dementia  -allow family to assist with care needs  -continue to utilize a calendar  system to help with appointment reminders -continue to allow son to drive you to your appointments and help run errands   Follow Up Plan: Telephone follow up appointment with care management team member scheduled for: 01/06/21   Problem: Social and Functional Symptoms   Priority: High    Long-Range Goal: Social and Functional Skills Optimized   Start Date: 11/04/2020  Expected End Date: 02/02/2021  This Visit's Progress: On track  Priority:  High  Note:   Current Barriers:   Ineffective Self Health Maintenance  Unable to self administer medications as prescribed  Lacks social connections  Does not contact provider office for questions/concerns  Currently UNABLE TO independently self manage needs related to chronic health conditions.   Knowledge Deficits related to short term plan for care coordination needs and long term plans for chronic disease management needs Case Manager Clinical Goal(s):  [name] Collaboration with [name], MD regarding development and update of comprehensive plan of care as evidenced by provider attestation and co-signature . Inter-disciplinary care team collaboration (see longitudinal plan of care)  Over the next 90 days, patient will work with care management team to address care coordination and chronic disease management needs related to Disease Management  Educational Needs  Care Coordination  Medication Management and Education  Psychosocial Support  Dementia and Caregiver Support   Interventions:   Assessed level of social support; promoted maintaining links with family, friends and community to reduce social isolation.   Assessed level of function related to basic activities of daily living that include eating, dressing, bathing, as well as instrumental activities of daily living such as shopping, managing finances and use of devices.   Encouraged continuation of daily life components such as self-care, home maintenance, financial management, volunteer activities, education opportunities and hobbies.   Discussed plans with patient for ongoing care management follow up and provided patient with direct contact information for care management team Patient Goal/Self-Care Activities:  Over the next 90 days, patient will -maintain or improve functional and social abilities   -follow MD recommendations for ongoing treatment of dementia -keep all MD follow up appointments   Follow Up  Plan: Telephone follow up appointment with care management team member scheduled for: 01/06/21     Plan:Telephone follow up appointment with care management team member scheduled for:  01/06/21  Barb Merino, RN, BSN, CCM Care Management Coordinator [facility]  Direct Phone: [phone]

## 2020-11-05 ENCOUNTER — Telehealth: Payer: Medicare HMO

## 2020-11-10 ENCOUNTER — Other Ambulatory Visit: Payer: Self-pay | Admitting: *Deleted

## 2020-11-10 DIAGNOSIS — D472 Monoclonal gammopathy: Secondary | ICD-10-CM

## 2020-11-12 ENCOUNTER — Inpatient Hospital Stay: Payer: Medicare HMO | Attending: Hematology and Oncology | Admitting: Hematology and Oncology

## 2020-11-12 ENCOUNTER — Other Ambulatory Visit: Payer: Self-pay

## 2020-11-12 ENCOUNTER — Inpatient Hospital Stay: Payer: Medicare HMO

## 2020-11-12 ENCOUNTER — Encounter: Payer: Self-pay | Admitting: Hematology and Oncology

## 2020-11-12 VITALS — BP 167/66 | HR 69 | Temp 98.7°F | Resp 16 | Ht 64.0 in | Wt 159.2 lb

## 2020-11-12 DIAGNOSIS — I10 Essential (primary) hypertension: Secondary | ICD-10-CM | POA: Insufficient documentation

## 2020-11-12 DIAGNOSIS — Z79899 Other long term (current) drug therapy: Secondary | ICD-10-CM | POA: Diagnosis not present

## 2020-11-12 DIAGNOSIS — E039 Hypothyroidism, unspecified: Secondary | ICD-10-CM | POA: Diagnosis not present

## 2020-11-12 DIAGNOSIS — D472 Monoclonal gammopathy: Secondary | ICD-10-CM | POA: Diagnosis not present

## 2020-11-12 LAB — CBC WITH DIFFERENTIAL (CANCER CENTER ONLY)
Abs Immature Granulocytes: 0.01 10*3/uL (ref 0.00–0.07)
Basophils Absolute: 0 10*3/uL (ref 0.0–0.1)
Basophils Relative: 0 %
Eosinophils Absolute: 0 10*3/uL (ref 0.0–0.5)
Eosinophils Relative: 0 %
HCT: 40.6 % (ref 36.0–46.0)
Hemoglobin: 13.3 g/dL (ref 12.0–15.0)
Immature Granulocytes: 0 %
Lymphocytes Relative: 30 %
Lymphs Abs: 1.6 10*3/uL (ref 0.7–4.0)
MCH: 31.5 pg (ref 26.0–34.0)
MCHC: 32.8 g/dL (ref 30.0–36.0)
MCV: 96.2 fL (ref 80.0–100.0)
Monocytes Absolute: 0.4 10*3/uL (ref 0.1–1.0)
Monocytes Relative: 8 %
Neutro Abs: 3.2 10*3/uL (ref 1.7–7.7)
Neutrophils Relative %: 62 %
Platelet Count: 167 10*3/uL (ref 150–400)
RBC: 4.22 MIL/uL (ref 3.87–5.11)
RDW: 14.6 % (ref 11.5–15.5)
WBC Count: 5.2 10*3/uL (ref 4.0–10.5)
nRBC: 0 % (ref 0.0–0.2)

## 2020-11-12 LAB — CMP (CANCER CENTER ONLY)
ALT: 9 U/L (ref 0–44)
AST: 17 U/L (ref 15–41)
Albumin: 3.9 g/dL (ref 3.5–5.0)
Alkaline Phosphatase: 59 U/L (ref 38–126)
Anion gap: 7 (ref 5–15)
BUN: 22 mg/dL (ref 8–23)
CO2: 28 mmol/L (ref 22–32)
Calcium: 9 mg/dL (ref 8.9–10.3)
Chloride: 104 mmol/L (ref 98–111)
Creatinine: 1.5 mg/dL — ABNORMAL HIGH (ref 0.44–1.00)
GFR, Estimated: 34 mL/min — ABNORMAL LOW (ref >60)
Glucose, Bld: 93 mg/dL (ref 70–99)
Potassium: 3.6 mmol/L (ref 3.5–5.1)
Sodium: 139 mmol/L (ref 135–145)
Total Bilirubin: 0.2 mg/dL — ABNORMAL LOW (ref 0.3–1.2)
Total Protein: 7.9 g/dL (ref 6.5–8.1)

## 2020-11-12 LAB — LACTATE DEHYDROGENASE: LDH: 291 U/L — ABNORMAL HIGH (ref 98–192)

## 2020-11-12 NOTE — Progress Notes (Signed)
[facility] [phone]   [phone]  PROGRESS NOTE  Patient Care Team: [name], MD as PCP - General (Internal Medicine) Heath Lark, MD as Consulting Physician (Hematology and Oncology) Daneen Schick as Social Worker [place], Claudette Stapler, RN as Case Manager  Hematological/Oncological History #Monoclonal Gammopathy of Undetermined Significance 1) 12/10/2013: M protein 0.86, kappa 2.32, lambda 1.4, K/L ratio 1.66 2) 07/15/2014: M protein 0.74, kappa 2.87, Lambda 1.06, K/L ratio: 2.71 3) 2016-2021: Lost to follow up with Dr. Alvy Bimler 4) 10/22/2019: M protein 0.7. No SFLC ordered. 5) 11/13/2019: establish care with Dr. Lorenso Courier   Interval History:  Brianna [name] 87 y.o. female with medical history significant for IgM MGUS who presents for a follow up visit. The patient's last visit was on 11/13/2019 at which time she established care. In the interim since the last visit she has had no hospitalizations or changes in health.  On exam today Brianna [name] reports that she has been well in the interim since her last visit. She has maintained a stable weight reports that she exercises every day by walking back and forth to her car. She notes that she had a relatively uneventful year last year since we last saw her. She has had an increase in her creatinine up to 1.5, however she was started on Lasix therapy on 05/18/2020. She notes that she also does not drink much water and tends to be more of a coffee drinker. She is currently up-to-date on her vaccines and has not had a Covid infection. She otherwise denies any fevers, chills, sweats, nausea, vomiting or diarrhea. A full 10 point ROS is listed below.  MEDICAL HISTORY:  Past Medical History:  Diagnosis Date  . Headache 12/24/2013  . Headache(784.0)   . Hypertension    pcp  [facility]  . Hypothyroid   . MGUS (monoclonal gammopathy of unknown significance) 12/10/2013    SURGICAL HISTORY: Past Surgical  History:  Procedure Laterality Date  . ABDOMINAL HYSTERECTOMY    . bladder tac    . BREAST BIOPSY Right 04/2017  . CATARACT EXTRACTION W/PHACO  08/22/2012   Procedure: CATARACT EXTRACTION PHACO AND INTRAOCULAR LENS PLACEMENT (IOC);  Surgeon: Adonis Brook, MD;  Location: [place];  Service: Ophthalmology;  Laterality: Left;  . CATARACT EXTRACTION W/PHACO Right 02/13/2013   Procedure: CATARACT EXTRACTION PHACO AND INTRAOCULAR LENS PLACEMENT (IOC);  Surgeon: Adonis Brook, MD;  Location: [facility];  Service: Ophthalmology;  Laterality: Right;    SOCIAL HISTORY: Social History   Socioeconomic History  . Marital status: Widowed    Spouse name: Not on file  . Number of children: 3  . Years of education: 17.5-18   . Highest education level: Bachelor's degree (e.g., BA, AB, BS)  Occupational History  . Occupation: retired  Tobacco Use  . Smoking status: Never Smoker  . Smokeless tobacco: Never Used  Vaping Use  . Vaping Use: Never used  Substance and Sexual Activity  . Alcohol use: Yes    Alcohol/week: 7.0 standard drinks    Types: 7 Shots of liquor per week  . Drug use: No  . Sexual activity: Not Currently  Other Topics Concern  . Not on file  Social History Narrative   Lives at home. Her sons live with her.    Right handed   Social Determinants of Health   Financial Resource Strain: Low Risk   . Difficulty of Paying Living Expenses: Not hard at all  Food Insecurity: No Food Insecurity  .  Worried About Charity fundraiser in the Last Year: Never true  . Ran Out of Food in the Last Year: Never true  Transportation Needs: No Transportation Needs  . Lack of Transportation (Medical): No  . Lack of Transportation (Non-Medical): No  Physical Activity: Sufficiently Active  . Days of Exercise per Week: 7 days  . Minutes of Exercise per Session: 30 min  Stress: No Stress Concern Present  . Feeling of Stress : Not at all  Social Connections: Not on file  Intimate Partner Violence: Not on  file    FAMILY HISTORY: Family History  Problem Relation Age of Onset  . Cancer Maternal Grandfather        stomach cancer  . Cancer Paternal Grandfather        stomach cancer  . Healthy Mother   . Dementia Mother        at 72-99 y.o  . Healthy Father     ALLERGIES:  is allergic to iodine and sulfa antibiotics.  MEDICATIONS:  Current Outpatient Medications  Medication Sig Dispense Refill  . amLODipine (NORVASC) 5 MG tablet TAKE 1 TABLET BY MOUTH EVERY DAY 90 tablet 1  . aspirin 81 MG tablet Take 81 mg by mouth daily.    . Cholecalciferol (VITAMIN D3) 125 MCG (5000 UT) CAPS Take 5,000 Units by mouth daily.     [name] EYSUVIS 0.25 % SUSP Apply to eye.    . fish oil-omega-3 fatty acids 1000 MG capsule Take 1 g by mouth daily.     . furosemide (LASIX) 40 MG tablet TAKE 1 TABLET (40 MG TOTAL) BY MOUTH DAILY AS NEEDED FOR FLUID OR EDEMA. SHE FORGETS TO TAKE IT 90 tablet 0  . Melatonin 5 MG TABS Take 1 tablet by mouth at bedtime as needed.    . Multiple Vitamin (MULTIVITAMIN WITH MINERALS) TABS Take 1 tablet by mouth daily.     [name] senna (SENOKOT) 8.6 MG TABS tablet Take 1 tablet by mouth daily as needed for mild constipation.    [name] SYNTHROID 125 MCG tablet TAKE 1 TABLET BY MOUTH MONDAY - SATURDAY 90 tablet 90  . XIIDRA 5 % SOLN Apply to eye. 2 drops per day     No current facility-administered medications for this visit.    REVIEW OF SYSTEMS:   Constitutional: ( - ) fevers, ( - )  chills , ( - ) night sweats Eyes: ( - ) blurriness of vision, ( - ) double vision, ( - ) watery eyes Ears, nose, mouth, throat, and face: ( - ) mucositis, ( - ) sore throat Respiratory: ( - ) cough, ( - ) dyspnea, ( - ) wheezes Cardiovascular: ( - ) palpitation, ( - ) chest discomfort, ( - ) lower extremity swelling Gastrointestinal:  ( - ) nausea, ( - ) heartburn, ( - ) change in bowel habits Skin: ( - ) abnormal skin rashes Lymphatics: ( - ) new lymphadenopathy, ( - ) easy bruising Neurological: ( - )  numbness, ( - ) tingling, ( - ) new weaknesses Behavioral/Psych: ( - ) mood change, ( - ) new changes  All other systems were reviewed with the patient and are negative.  PHYSICAL EXAMINATION: ECOG PERFORMANCE STATUS: 2 - Symptomatic, <50% confined to bed   Vitals:   11/12/20 1536  BP: (!) 167/66  Pulse: 69  Resp: 16  Temp: 98.7 F (37.1 C)  SpO2: 100%   Filed Weights   11/12/20 1536  Weight: 159 lb 3.2 oz (  72.2 kg)    GENERAL: alert, no distress and comfortable SKIN: skin color, texture, turgor are normal, no rashes or significant lesions EYES: conjunctiva are pink and non-injected, sclera clear LUNGS: clear to auscultation and percussion with normal breathing effort HEART: regular rate & rhythm and no murmurs and no lower extremity edema Musculoskeletal: no cyanosis of digits and no clubbing  PSYCH: alert & oriented x 3, fluent speech NEURO: no focal motor/sensory deficits  LABORATORY DATA:  I have reviewed the data as listed CBC Latest Ref Rng & Units 11/12/2020 11/13/2019 03/25/2019  WBC 4.0 - 10.5 K/uL 5.2 4.4 4.1  Hemoglobin 12.0 - 15.0 g/dL 13.3 12.5 13.2  Hematocrit 36.0 - 46.0 % 40.6 39.3 39.1  Platelets 150 - 400 K/uL 167 169 178    CMP Latest Ref Rng & Units 11/12/2020 02/19/2020 11/13/2019  Glucose 70 - 99 mg/dL 93 104(H) 93  BUN 8 - 23 mg/dL '22 22 18  ' Creatinine 0.44 - 1.00 mg/dL 1.50(H) 1.17(H) 0.92  Sodium 135 - 145 mmol/L 139 142 143  Potassium 3.5 - 5.1 mmol/L 3.6 3.9 3.8  Chloride 98 - 111 mmol/L 104 99 105  CO2 22 - 32 mmol/L '28 24 30  ' Calcium 8.9 - 10.3 mg/dL 9.0 9.2 8.6(L)  Total Protein 6.5 - 8.1 g/dL 7.9 - 7.8  Total Bilirubin 0.3 - 1.2 mg/dL 0.2(L) - 0.3  Alkaline Phos 38 - 126 U/L 59 - 66  AST 15 - 41 U/L 17 - 15  ALT 0 - 44 U/L 9 - 12    Lab Results  Component Value Date   MPROTEIN 0.5 (H) 11/13/2019   Lab Results  Component Value Date   KPAFRELGTCHN 28.8 (H) 11/13/2019   KPAFRELGTCHN 2.87 (H) 07/15/2014   KPAFRELGTCHN 2.32 (H)  12/10/2013   LAMBDASER 18.9 11/13/2019   LAMBDASER 1.06 07/15/2014   LAMBDASER 1.40 12/10/2013   KAPLAMBRATIO 1.52 11/13/2019   KAPLAMBRATIO 2.71 (H) 07/15/2014   KAPLAMBRATIO 16.13 (H) 12/13/2013    RADIOGRAPHIC STUDIES: No results found.  ASSESSMENT & PLAN Brianna [name] 87 y.o. female with medical history significant for HTN, hypothyroidism who presents for evaluation of her longstanding MGUS.  After review the lab and discussion with the patient her findings are most consistent with a monoclonal gammopathy of undetermined significance.  It would appear that her disease has been stable since she was initially diagnosed by Dr. Alvy Bimler.  Today we will repeat our assessment of her monoclonal gammopathy to assure that her other labs are currently stable at this time.  Furthermore we will repeat a metastatic survey to assure that there are no new lytic lesions.  If all of the below labs appear normal we will have the patient return to follow-up in approximately 1 years time.  In terms of bone marrow biopsy patient has had stable hemoglobin, creatinine, and other blood work since she 1st established with Dr. Alvy Bimler in 2015. As such I do not think it is necessary to pursue a bone marrow biopsy though the patient would qualify given the presence of an IgM monoclonal myopathy. I do have a low suspicion of Charlcie Cradle or other hematological malignancy causing the patient's MGUS. As such I do believe it is appropriate this time to continue with yearly observation with the labs listed below.  #Monoclonal Gammopathy of Undetermined Significance --today will order CBC and CMP to assess for anemia, calcium level, and kidney dysfunction --will order SPEP with IFE, SFLC,and UPEP --will order a DG bone survey to assess  for lytic lesions. Repeat yearly.  --RTC in 1 years time or sooner if indicated by labs.    Orders Placed This Encounter  Procedures  . DG Bone Survey Met    Standing Status:    Future    Standing Expiration Date:   11/12/2021    Order Specific Question:   Reason for Exam (SYMPTOM  OR DIAGNOSIS REQUIRED)    Answer:   MGUS, assess for lytic lesions    Order Specific Question:   Preferred imaging location?    Answer:   [facility]  . 24-Hr Ur UPEP/UIFE/Light Chains/TP    Standing Status:   Future    Standing Expiration Date:   11/12/2021    All questions were answered. The patient knows to call the clinic with any problems, questions or concerns.  A total of more than 30 minutes were spent on this encounter and over half of that time was spent on counseling and coordination of care as outlined above.   Ledell Peoples, MD Department of Hematology/Oncology [facility] [facility] Phone: [phone] Pager: [phone] Email: [email or web address]  11/12/2020 5:30 PM

## 2020-11-13 LAB — KAPPA/LAMBDA LIGHT CHAINS
Kappa free light chain: 37.4 mg/L — ABNORMAL HIGH (ref 3.3–19.4)
Kappa, lambda light chain ratio: 2.14 — ABNORMAL HIGH (ref 0.26–1.65)
Lambda free light chains: 17.5 mg/L (ref 5.7–26.3)

## 2020-11-16 LAB — MULTIPLE MYELOMA PANEL, SERUM
Albumin SerPl Elph-Mcnc: 4 g/dL (ref 2.9–4.4)
Albumin/Glob SerPl: 1.2 (ref 0.7–1.7)
Alpha 1: 0.2 g/dL (ref 0.0–0.4)
Alpha2 Glob SerPl Elph-Mcnc: 0.7 g/dL (ref 0.4–1.0)
B-Globulin SerPl Elph-Mcnc: 1.1 g/dL (ref 0.7–1.3)
Gamma Glob SerPl Elph-Mcnc: 1.6 g/dL (ref 0.4–1.8)
Globulin, Total: 3.6 g/dL (ref 2.2–3.9)
IgA: 357 mg/dL (ref 64–422)
IgG (Immunoglobin G), Serum: 1061 mg/dL (ref 586–1602)
IgM (Immunoglobulin M), Srm: 767 mg/dL — ABNORMAL HIGH (ref 26–217)
M Protein SerPl Elph-Mcnc: 0.9 g/dL — ABNORMAL HIGH
Total Protein ELP: 7.6 g/dL (ref 6.0–8.5)

## 2020-11-23 ENCOUNTER — Ambulatory Visit (HOSPITAL_COMMUNITY): Admission: RE | Admit: 2020-11-23 | Payer: Medicare HMO | Source: Ambulatory Visit

## 2020-11-25 ENCOUNTER — Ambulatory Visit: Payer: Medicare HMO

## 2020-11-25 DIAGNOSIS — G3184 Mild cognitive impairment, so stated: Secondary | ICD-10-CM

## 2020-11-25 DIAGNOSIS — I10 Essential (primary) hypertension: Secondary | ICD-10-CM

## 2020-11-25 NOTE — Chronic Care Management (AMB) (Signed)
Chronic Care Management    Social Work Note  11/25/2020 Name: Brianna [name] MRN: [id number] DOB: [birth year]  Brianna [name] is a 87 y.o. year old female who is a primary care patient of [name], MD. The CCM team was consulted to assist the patient with chronic disease management and/or care coordination needs related to: [employer] .   Engaged with patient by telephone for follow up visit in response to provider referral for social work chronic care management and care coordination services.   Consent to Services:  The patient was given information about Chronic Care Management services, agreed to services, and gave verbal consent prior to initiation of services.  Please see initial visit note for detailed documentation.   Patient agreed to services and consent obtained.   SW placed a successful outbound call to the patient to assess for care coordination needs. Patient does not remember SW and had a hard time following conversation. SW collaboration with Consulting civil engineer regarding concern with overwhelming the patient with too many team members involved. SW will plan to stay on care team as needed with RN Care Manager being patients primary contact.  Assessment: Review of patient past medical history, allergies, medications, and health status, including review of relevant consultants reports was performed today as part of a comprehensive evaluation and provision of chronic care management and care coordination services.     SDOH (Social Determinants of Health) assessments and interventions performed:    Advanced Directives Status: Not addressed in this encounter.  CCM Care Plan  Allergies  Allergen Reactions  . Iodine Other (See Comments)    Reaction unknown  . Sulfa Antibiotics Other (See Comments)    Childhood reaction    Outpatient Encounter Medications as of 11/25/2020  Medication Sig  . amLODipine (NORVASC) 5 MG tablet TAKE 1 TABLET BY MOUTH EVERY DAY  .  aspirin 81 MG tablet Take 81 mg by mouth daily.  . Cholecalciferol (VITAMIN D3) 125 MCG (5000 UT) CAPS Take 5,000 Units by mouth daily.   [name] EYSUVIS 0.25 % SUSP Apply to eye.  . fish oil-omega-3 fatty acids 1000 MG capsule Take 1 g by mouth daily.   . furosemide (LASIX) 40 MG tablet TAKE 1 TABLET (40 MG TOTAL) BY MOUTH DAILY AS NEEDED FOR FLUID OR EDEMA. SHE FORGETS TO TAKE IT  . Melatonin 5 MG TABS Take 1 tablet by mouth at bedtime as needed.  . Multiple Vitamin (MULTIVITAMIN WITH MINERALS) TABS Take 1 tablet by mouth daily.   [name] senna (SENOKOT) 8.6 MG TABS tablet Take 1 tablet by mouth daily as needed for mild constipation.  [name] SYNTHROID 125 MCG tablet TAKE 1 TABLET BY MOUTH MONDAY - SATURDAY  . XIIDRA 5 % SOLN Apply to eye. 2 drops per day   No facility-administered encounter medications on file as of 11/25/2020.    Patient Active Problem List   Diagnosis Date Noted  . Overweight with body mass index (BMI) of 27 to 27.9 in adult 02/24/2020  . Primary hypothyroidism 02/24/2020  . Chronic renal disease, stage II 02/24/2020  . Hypertensive nephropathy 02/24/2020  . Cellulitis and abscess of right lower extremity 07/16/2019  . Edema of both lower extremities 07/16/2019  . MCI (mild cognitive impairment) 04/18/2019  . Chronic headache 07/24/2014  . Essential hypertension 12/24/2013  . MGUS (monoclonal gammopathy of unknown significance) 12/10/2013    Conditions to be addressed/monitored: HTN and Dementia; Memory Deficits    Follow Up Plan: No SW follow up planned  at this time. SW collaboration with RN Care Manager who will continue to engage with the patient and notify SW if care coordination needs are identified.      Daneen Schick, BSW, CDP Social Worker, Certified Dementia Practitioner [facility] [phone]

## 2020-12-15 DIAGNOSIS — N816 Rectocele: Secondary | ICD-10-CM | POA: Diagnosis not present

## 2020-12-15 DIAGNOSIS — N8110 Cystocele, unspecified: Secondary | ICD-10-CM | POA: Diagnosis not present

## 2021-01-06 ENCOUNTER — Telehealth: Payer: Medicare HMO

## 2021-01-06 ENCOUNTER — Ambulatory Visit (INDEPENDENT_AMBULATORY_CARE_PROVIDER_SITE_OTHER): Payer: Medicare HMO

## 2021-01-06 DIAGNOSIS — N182 Chronic kidney disease, stage 2 (mild): Secondary | ICD-10-CM

## 2021-01-06 DIAGNOSIS — H9190 Unspecified hearing loss, unspecified ear: Secondary | ICD-10-CM

## 2021-01-06 DIAGNOSIS — R413 Other amnesia: Secondary | ICD-10-CM

## 2021-01-06 DIAGNOSIS — I10 Essential (primary) hypertension: Secondary | ICD-10-CM

## 2021-01-06 DIAGNOSIS — E039 Hypothyroidism, unspecified: Secondary | ICD-10-CM | POA: Diagnosis not present

## 2021-01-15 NOTE — Chronic Care Management (AMB) (Signed)
Chronic Care Management   CCM RN Visit Note  01/06/2021 Name: Brianna [name] MRN: [id number] DOB: June 16, 1933  Subjective: Brianna [name] is a 87 y.o. year old female who is a primary care patient of [name], MD. The care management team was consulted for assistance with disease management and care coordination needs.    Engaged with patient by telephone for follow up visit in response to provider referral for case management and/or care coordination services.   Consent to Services:  The patient was given information about Chronic Care Management services, agreed to services, and gave verbal consent prior to initiation of services.  Please see initial visit note for detailed documentation.   Patient agreed to services and verbal consent obtained.   Assessment: Review of patient past medical history, allergies, medications, health status, including review of consultants reports, laboratory and other test data, was performed as part of comprehensive evaluation and provision of chronic care management services.   SDOH (Social Determinants of Health) assessments and interventions performed:  Yes, no acute needs identified   CCM Care Plan  Allergies  Allergen Reactions  . Iodine Other (See Comments)    Reaction unknown  . Sulfa Antibiotics Other (See Comments)    Childhood reaction    Outpatient Encounter Medications as of 01/06/2021  Medication Sig  . amLODipine (NORVASC) 5 MG tablet TAKE 1 TABLET BY MOUTH EVERY DAY  . aspirin 81 MG tablet Take 81 mg by mouth daily.  . Cholecalciferol (VITAMIN D3) 125 MCG (5000 UT) CAPS Take 5,000 Units by mouth daily.   [name] EYSUVIS 0.25 % SUSP Apply to eye.  . fish oil-omega-3 fatty acids 1000 MG capsule Take 1 g by mouth daily.   . furosemide (LASIX) 40 MG tablet TAKE 1 TABLET (40 MG TOTAL) BY MOUTH DAILY AS NEEDED FOR FLUID OR EDEMA. SHE FORGETS TO TAKE IT  . Melatonin 5 MG TABS Take 1 tablet by mouth at bedtime as needed.  . Multiple  Vitamin (MULTIVITAMIN WITH MINERALS) TABS Take 1 tablet by mouth daily.   [name] senna (SENOKOT) 8.6 MG TABS tablet Take 1 tablet by mouth daily as needed for mild constipation.  [name] SYNTHROID 125 MCG tablet TAKE 1 TABLET BY MOUTH MONDAY - SATURDAY  . XIIDRA 5 % SOLN Apply to eye. 2 drops per day   No facility-administered encounter medications on file as of 01/06/2021.    Patient Active Problem List   Diagnosis Date Noted  . Overweight with body mass index (BMI) of 27 to 27.9 in adult 02/24/2020  . Primary hypothyroidism 02/24/2020  . Chronic renal disease, stage II 02/24/2020  . Hypertensive nephropathy 02/24/2020  . Cellulitis and abscess of right lower extremity 07/16/2019  . Edema of both lower extremities 07/16/2019  . MCI (mild cognitive impairment) 04/18/2019  . Chronic headache 07/24/2014  . Essential hypertension 12/24/2013  . MGUS (monoclonal gammopathy of unknown significance) 12/10/2013    Conditions to be addressed/monitored:HTN, hearing loss, memory loss, hypothyroidism, chronic renal disease   Care Plan : General Plan of Care (Adult)  Updates made by Lynne Logan, RN since 01/15/2021 12:00 AM    Problem: Quality of Life (General Plan of Care)   Priority: High    Long-Range Goal: Quality of Life Maintained   Start Date: 11/04/2020  Expected End Date: 05/04/2021  Recent Progress: On track  Priority: High  Note:   Current Barriers:   Ineffective Self Health Maintenance  Unable to self administer medications as prescribed  Lacks social  connections  Does not maintain contact with provider office  Currently UNABLE TO independently self manage needs related to chronic health conditions.   Knowledge Deficits related to short term plan for care coordination needs and long term plans for chronic disease management needs Case Manager Clinical Goal(s):  [name] Collaboration with [name], MD regarding development and update of comprehensive plan of care as evidenced by  provider attestation and co-signature . Inter-disciplinary care team collaboration (see longitudinal plan of care)  Over the next 90 days, patient will work with care management team to address care coordination and chronic disease management needs related to Disease Management  Educational Needs  Care Coordination  Medication Management and Education  Psychosocial Support  Dementia and Caregiver Support   Interventions:   Assess and monitor for signs/symptoms of psychosocial concerns, especially depression or ideations regarding harm to others or self; provide or refer for mental health services as needed.   Identify sensory issues that impact quality of life such as hearing loss, vision deficit; strategize ways to maintain or improve hearing, vision.   Advocate for the development of palliative care plan.    Discussed plans with patient for ongoing care management follow up and provided patient with direct contact information for care management team Patient Goals/Self-Care Activities:  Over the next 90 days, patient will - ask family or friend for a ride - call to cancel if needed - keep a calendar with prescription refill dates - keep a calendar with appointment dates - check out options for in-home help, long-term care or hospice - discuss my treatment options with the doctor or nurse - make shared treatment decisions with doctor  Follow Up Plan: Telephone follow up appointment with care management team member scheduled for: 03/17/21  Care Plan : Dementia (Adult)  Updates made by Lynne Logan, RN since 01/15/2021 12:00 AM    Problem: Cognitive Function   Priority: High    Long-Range Goal: Optimal Cognitive Function   Start Date: 11/04/2020  Expected End Date: 05/04/2021  Recent Progress: On track  Priority: High  Note:   Current Barriers:   Ineffective Self Health Maintenance  Unable to self administer medications as prescribed  Lacks social connections  Does  not contact provider office for questions/concerns  Currently UNABLE TO independently self manage needs related to chronic health conditions.   Knowledge Deficits related to short term plan for care coordination needs and long term plans for chronic disease management needs Case Manager Clinical Goal(s):  [name] Collaboration with [name], MD regarding development and update of comprehensive plan of care as evidenced by provider attestation and co-signature . Inter-disciplinary care team collaboration (see longitudinal plan of care)  Over the next 90 days, patient will work with care management team to address care coordination and chronic disease management needs related to Disease Management  Educational Needs  Care Coordination  Medication Management and Education  Psychosocial Support  Dementia and Caregiver Support   Interventions:   Encourage social relationships and physical activity based on ability.   Promote strategies that will preserve the individual's abilities; focus on strengths and quality of life optimization.   Discussed plans with patient for ongoing care management follow up and provided patient with direct contact information for care management team Patient Goals/Self-Care Activities:  Over the next 90 days, patient will  -contact Neurologist to schedule a follow up appointment for re-evaluation and treatment of dementia  -allow family to assist with care needs  -continue to utilize a calendar  system to help with appointment reminders -continue to allow son to drive you to your appointments and help run errands   Follow Up Plan: Telephone follow up appointment with care management team member scheduled for: 03/17/21  Problem: Social and Functional Symptoms   Priority: High    Long-Range Goal: Social and Functional Skills Optimized   Start Date: 11/04/2020  Expected End Date: 05/04/2021  Recent Progress: On track  Priority: High  Note:   Current  Barriers:   Ineffective Self Health Maintenance  Unable to self administer medications as prescribed  Lacks social connections  Does not contact provider office for questions/concerns  Currently UNABLE TO independently self manage needs related to chronic health conditions.   Knowledge Deficits related to short term plan for care coordination needs and long term plans for chronic disease management needs Case Manager Clinical Goal(s):  [name] Collaboration with [name], MD regarding development and update of comprehensive plan of care as evidenced by provider attestation and co-signature . Inter-disciplinary care team collaboration (see longitudinal plan of care)  Over the next 90 days, patient will work with care management team to address care coordination and chronic disease management needs related to Disease Management  Educational Needs  Care Coordination  Medication Management and Education  Psychosocial Support  Dementia and Caregiver Support   Interventions:   Assessed level of social support; promoted maintaining links with family, friends and community to reduce social isolation.   Assessed level of function related to basic activities of daily living that include eating, dressing, bathing, as well as instrumental activities of daily living such as shopping, managing finances and use of devices.   Encouraged continuation of daily life components such as self-care, home maintenance, financial management, volunteer activities, education opportunities and hobbies.   Discussed plans with patient for ongoing care management follow up and provided patient with direct contact information for care management team Patient Goal/Self-Care Activities:  Over the next 90 days, patient will -maintain or improve functional and social abilities   -follow MD recommendations for ongoing treatment of dementia -keep all MD follow up appointments   Follow Up Plan: Telephone follow  up appointment with care management team member scheduled for: 03/17/21   Care Plan : Chronic Kidney (Adult)  Updates made by Lynne Logan, RN since 01/15/2021 12:00 AM    Problem: Disease Progression   Priority: Medium    Long-Range Goal: Disease Progression Prevented or Minimized   Start Date: 01/06/2021  Expected End Date: 07/09/2021  This Visit's Progress: On track  Priority: Medium  Note:   Current Barriers:   Ineffective Self Health Maintenance  Does not attend all scheduled provider appointments  Dementia  Clinical Goal(s):  [name] Collaboration with [name], MD regarding development and update of comprehensive plan of care as evidenced by provider attestation and co-signature . Inter-disciplinary care team collaboration (see longitudinal plan of care)  patient will work with care management team to address care coordination and chronic disease management needs related to Disease Management  Educational Needs  Care Coordination  Medication Management and Education  Psychosocial Support   Interventions:   Evaluation of current treatment plan related to CKD Stage III , self-management and patient's adherence to plan as established by provider.  Collaboration with [name], MD regarding development and update of comprehensive plan of care as evidenced by provider attestation       and co-signature  Inter-disciplinary care team collaboration (see longitudinal plan of care) . Provided education to patient about  basic CKD disease process . Review of patient status, including review of consultants reports, relevant laboratory and other test results, and medications completed. . Reviewed medications with patient and discussed importance of medication adherence . Educated on importance of drinking plenty of water, discussed drinking 64 oz daily unless otherwise directed, adhering to low Sodium diet and keeping BP well controlled   Discussed plans with patient for  ongoing care management follow up and provided patient with direct contact information for care management team Self Care Activities:  . Continue to adhere to MD recommendations for CKD  . Continue to keep all scheduled follow up appointments . Take medications as directed  . Let your healthcare team know if you are unable to take your medications . Call your pharmacy for refills at least 7 days prior to running out of medication . Increase your water intake unless otherwise directed . Review mailed printed educational materials related to Kidney diease Patient Goals: -maintain adequate kidney function   Follow Up Plan: Telephone follow up appointment with care management team member scheduled for: 03/17/21   Plan:Telephone follow up appointment with care management team member scheduled for:  03/17/21  Barb Merino, RN, BSN, CCM  Care Management Coordinator [facility]  Direct Phone: [phone]

## 2021-01-15 NOTE — Patient Instructions (Signed)
Goals Addressed    Other   .  Follow My Treatment Plan-Chronic Kidney   On track     Timeframe:  Long-Range Goal Priority:  Medium Start Date: 01/06/21                            Expected End Date: 07/09/21                      Follow Up Date: 03/17/21     Self Care Activities:  . Continue to adhere to MD recommendations for CKD  . Continue to keep all scheduled follow up appointments . Take medications as directed  . Let your healthcare team know if you are unable to take your medications . Call your pharmacy for refills at least 7 days prior to running out of medication . Increase your water intake unless otherwise directed . Review mailed printed educational materials related to Kidney diease Patient Goals: -maintain adequate kidney function    Why is this important?    Staying as healthy as you can is very important. This may mean making changes if you smoke, don't exercise or eat poorly.   A healthy lifestyle is an important goal for you.   Following the treatment plan and making changes may be hard.   Try some of these steps to help keep the disease from getting worse.     Notes:     [name]  Maintain Optimal Cognitive Function   On track     Timeframe:  Long-Range Goal Priority:  High Start Date: 11/04/20                            Expected End Date:  05/04/21       Follow Up Date: 03/17/21  Patient Self-Care Activities:  -contact Neurologist to schedule a follow up appointment for re-evaluation and treatment of dementia  -allow family to assist with care needs  -continue to utilize a calendar system to help with appointment reminders -continue to allow son to drive you to your appointments and help run errands                    .  Maintain Social and Functional Ability   On track     Timeframe:  Long-Range Goal Priority:  High Start Date:  11/04/20                           Expected End Date:  05/04/21  Follow up date: 03/17/21     Patient Goals/Self-Care Activities:    -maintain or improve functional and social abilities   -follow MD recommendations for ongoing treatment of dementia -keep all MD follow up appointments                      .  Make and Keep All Appointments   On track     Timeframe:  Long-Range Goal Priority:  High Start Date: 11/04/20                            Expected End Date:  02/02/21                     Follow Up Date: 03/17/21   - ask family or friend for a ride -  call to cancel if needed - keep a calendar with prescription refill dates - keep a calendar with appointment dates    Why is this important?    Part of staying healthy is seeing the doctor for follow-up care.   If you forget your appointments, there are some things you can do to stay on track.    Notes:     [name]  Matintain My Quality of Life   On track     Timeframe:  Long-Range Goal Priority:  High Start Date:  11/04/20                           Expected End Date:  05/04/21                    Follow Up Date: 03/17/21   - check out options for in-home help, long-term care or hospice - discuss my treatment options with the doctor or nurse - make shared treatment decisions with doctor    Why is this important?    Having a long-term illness can be scary.   It can also be stressful for you and your caregiver.   These steps may help.    Notes:

## 2021-02-25 ENCOUNTER — Ambulatory Visit (INDEPENDENT_AMBULATORY_CARE_PROVIDER_SITE_OTHER): Payer: Medicare HMO

## 2021-02-25 VITALS — Ht 67.0 in | Wt 160.0 lb

## 2021-02-25 DIAGNOSIS — Z0000 Encounter for general adult medical examination without abnormal findings: Secondary | ICD-10-CM

## 2021-02-25 NOTE — Progress Notes (Signed)
I connected with Regan Lemming today by telephone and verified that I am speaking with the correct person using two identifiers. Location patient: home Location provider: work Persons participating in the virtual visit: Carlyne Keehan, Lillar Bianca (son), Glenna Durand LPN.   I discussed the limitations, risks, security and privacy concerns of performing an evaluation and management service by telephone and the availability of in person appointments. I also discussed with the patient that there may be a patient responsible charge related to this service. The patient expressed understanding and verbally consented to this telephonic visit.    Interactive audio and video telecommunications were attempted between this provider and patient, however failed, due to patient having technical difficulties OR patient did not have access to video capability.  We continued and completed visit with audio only.     Vital signs may be patient reported or missing.  Subjective:   Brianna [name] is a 87 y.o. female who presents for Medicare Annual (Subsequent) preventive examination.  Review of Systems     Cardiac Risk Factors include: advanced age (>52men, >62 women);hypertension     Objective:    Today's Vitals   02/25/21 1457  Weight: 160 lb (72.6 kg)  Height: 5\' 7"  (1.702 m)   Body mass index is 25.06 kg/m.  Advanced Directives 02/25/2021 02/19/2020 11/13/2019 09/17/2019 12/27/2018 09/02/2018 03/10/2015  Does Patient Have a Medical Advance Directive? No No No No No No No  Would patient like information on creating a medical advance directive? - Yes (MAU/Ambulatory/Procedural Areas - Information given) No - Patient declined - Yes (MAU/Ambulatory/Procedural Areas - Information given) Yes (ED - Information included in AVS) No - patient declined information  Pre-existing out of facility DNR order (yellow form or pink MOST form) - - - - - - -    Current Medications (verified) Outpatient  Encounter Medications as of 02/25/2021  Medication Sig  . amLODipine (NORVASC) 5 MG tablet TAKE 1 TABLET BY MOUTH EVERY DAY  . aspirin 81 MG tablet Take 81 mg by mouth daily.  . furosemide (LASIX) 40 MG tablet TAKE 1 TABLET (40 MG TOTAL) BY MOUTH DAILY AS NEEDED FOR FLUID OR EDEMA. SHE FORGETS TO TAKE IT  . Melatonin 5 MG TABS Take 1 tablet by mouth at bedtime as needed.  . senna (SENOKOT) 8.6 MG TABS tablet Take 1 tablet by mouth daily as needed for mild constipation.  [name] SYNTHROID 125 MCG tablet TAKE 1 TABLET BY MOUTH MONDAY - SATURDAY  . Cholecalciferol (VITAMIN D3) 125 MCG (5000 UT) CAPS Take 5,000 Units by mouth daily.  (Patient not taking: Reported on 02/25/2021)  . EYSUVIS 0.25 % SUSP Apply to eye. (Patient not taking: Reported on 02/25/2021)  . fish oil-omega-3 fatty acids 1000 MG capsule Take 1 g by mouth daily.  (Patient not taking: Reported on 02/25/2021)  . Multiple Vitamin (MULTIVITAMIN WITH MINERALS) TABS Take 1 tablet by mouth daily.  (Patient not taking: Reported on 02/25/2021)  . XIIDRA 5 % SOLN Apply to eye. 2 drops per day (Patient not taking: Reported on 02/25/2021)   No facility-administered encounter medications on file as of 02/25/2021.    Allergies (verified) Iodine and Sulfa antibiotics   History: Past Medical History:  Diagnosis Date  . Headache 12/24/2013  . Headache(784.0)   . Hypertension    pcp  [facility]  . Hypothyroid   . MGUS (monoclonal gammopathy of unknown significance) 12/10/2013   Past Surgical History:  Procedure Laterality Date  . ABDOMINAL HYSTERECTOMY    .  bladder tac    . BREAST BIOPSY Right 04/2017  . CATARACT EXTRACTION W/PHACO  08/22/2012   Procedure: CATARACT EXTRACTION PHACO AND INTRAOCULAR LENS PLACEMENT (IOC);  Surgeon: Adonis Brook, MD;  Location: [facility];  Service: Ophthalmology;  Laterality: Left;  . CATARACT EXTRACTION W/PHACO Right 02/13/2013   Procedure: CATARACT EXTRACTION PHACO AND INTRAOCULAR LENS PLACEMENT (IOC);  Surgeon: Adonis Brook, MD;  Location: [facility];  Service: Ophthalmology;  Laterality: Right;   Family History  Problem Relation Age of Onset  . Cancer Maternal Grandfather        stomach cancer  . Cancer Paternal Grandfather        stomach cancer  . Healthy Mother   . Dementia Mother        at 94-99 y.o  . Healthy Father    Social History   Socioeconomic History  . Marital status: Widowed    Spouse name: Not on file  . Number of children: 3  . Years of education: 17.5-18   . Highest education level: Bachelor's degree (e.g., BA, AB, BS)  Occupational History  . Occupation: retired  Tobacco Use  . Smoking status: Never Smoker  . Smokeless tobacco: Never Used  Vaping Use  . Vaping Use: Never used  Substance and Sexual Activity  . Alcohol use: Yes    Alcohol/week: 7.0 standard drinks    Types: 7 Shots of liquor per week  . Drug use: No  . Sexual activity: Not Currently  Other Topics Concern  . Not on file  Social History Narrative   Lives at home. Her sons live with her.    Right handed   Social Determinants of Health   Financial Resource Strain: Low Risk   . Difficulty of Paying Living Expenses: Not hard at all  Food Insecurity: No Food Insecurity  . Worried About Charity fundraiser in the Last Year: Never true  . Ran Out of Food in the Last Year: Never true  Transportation Needs: No Transportation Needs  . Lack of Transportation (Medical): No  . Lack of Transportation (Non-Medical): No  Physical Activity: Sufficiently Active  . Days of Exercise per Week: 7 days  . Minutes of Exercise per Session: 30 min  Stress: No Stress Concern Present  . Feeling of Stress : Not at all  Social Connections: Not on file    Tobacco Counseling Counseling given: Not Answered   Clinical Intake:  Pre-visit preparation completed: Yes  Pain : No/denies pain     Nutritional Status: BMI 25 -29 Overweight Nutritional Risks: None Diabetes: No  How often do you need to have someone help you  when you read instructions, pamphlets, or other written materials from your doctor or pharmacy?: 1 - Never What is the last grade level you completed in school?: masters degree  Diabetic? no  Interpreter Needed?: No  Information entered by :: NAllen LPN   Activities of Daily Living In your present state of health, do you have any difficulty performing the following activities: 02/25/2021  Hearing? N  Vision? N  Difficulty concentrating or making decisions? N  Walking or climbing stairs? N  Dressing or bathing? N  Doing errands, shopping? Y  Comment does not Physiological scientist and eating ? N  Using the Toilet? N  In the past six months, have you accidently leaked urine? N  Do you have problems with loss of bowel control? N  Managing your Medications? N  Managing your Finances? N  Housekeeping or managing  your Housekeeping? N  Some recent data might be hidden    Patient Care Team: [name], MD as PCP - General (Internal Medicine) Heath Lark, MD as Consulting Physician (Hematology and Oncology) Daneen Schick as Social Worker [place], Claudette Stapler, RN as Case Manager  Indicate any recent Medical Services you may have received from other than [facility] providers in the past year (date may be approximate).     Assessment:   This is a routine wellness examination for Olive.  Hearing/Vision screen  Hearing Screening   125Hz  250Hz  500Hz  1000Hz  2000Hz  3000Hz  4000Hz  6000Hz  8000Hz   Right ear:           Left ear:           Vision Screening Comments: Regular eye exams,   Dietary issues and exercise activities discussed: Current Exercise Habits: Home exercise routine, Type of exercise: walking, Time (Minutes): 30, Frequency (Times/Week): 7, Weekly Exercise (Minutes/Week): 210  Goals Addressed            This Visit's Progress   . Patient Stated       02/25/2021, drink more water      Depression Screen PHQ 2/9 Scores 02/25/2021 02/19/2020 09/17/2019 08/19/2019 07/10/2019  03/25/2019 12/27/2018  PHQ - 2 Score 0 0 0 0 0 0 0  PHQ- 9 Score - 0 3 - - - -    Fall Risk Fall Risk  02/25/2021 02/19/2020 09/17/2019 08/19/2019 07/10/2019  Falls in the past year? 0 0 0 0 0  Risk for fall due to : Medication side effect Medication side effect Medication side effect - -  Follow up Falls evaluation completed;Education provided;Falls prevention discussed Falls evaluation completed;Education provided;Falls prevention discussed Falls evaluation completed;Education provided;Falls prevention discussed - -    FALL RISK PREVENTION PERTAINING TO THE HOME:  Any stairs in or around the home? Yes  If so, are there any without handrails? No  Home free of loose throw rugs in walkways, pet beds, electrical cords, etc? Yes  Adequate lighting in your home to reduce risk of falls? Yes   ASSISTIVE DEVICES UTILIZED TO PREVENT FALLS:  Life alert? No  Use of a cane, walker or w/c? No  Grab bars in the bathroom? No  Shower chair or bench in shower? Yes  Elevated toilet seat or a handicapped toilet? No   TIMED UP AND GO:  Was the test performed? No   Cognitive Function: MMSE - Mini Mental State Exam 04/23/2020 04/17/2019  Orientation to time 5 4  Orientation to Place 5 5  Registration 3 3  Attention/ Calculation 5 1  Recall 0 0  Language- name 2 objects 2 2  Language- repeat 1 0  Language- follow 3 step command 3 3  Language- read & follow direction 1 1  Write a sentence 1 1  Copy design 1 1  Total score 27 21     6CIT Screen 02/25/2021 02/19/2020 09/17/2019 02/25/2019  What Year? 4 points 0 points 4 points 0 points  What month? 3 points 0 points 0 points 0 points  What time? 0 points 3 points 0 points 0 points  Count back from 20 0 points 0 points 0 points 0 points  Months in reverse 4 points 4 points 0 points 4 points  Repeat phrase 2 points 10 points 10 points 10 points  Total Score 13 17 14 14     Immunizations Immunization History  Administered Date(s) Administered  .  Influenza-Unspecified 07/17/2014  . PFIZER(Purple Top)SARS-COV-2 Vaccination 12/20/2019, 01/15/2020  .  Pneumococcal Conjugate-13 09/17/2019  . Tdap 09/17/2019    TDAP status: Up to date  Flu Vaccine status: Up to date  Pneumococcal vaccine status: Due, Education has been provided regarding the importance of this vaccine. Advised may receive this vaccine at local pharmacy or Health Dept. Aware to provide a copy of the vaccination record if obtained from local pharmacy or Health Dept. Verbalized acceptance and understanding.  Covid-19 vaccine status: Completed vaccines  Qualifies for Shingles Vaccine? Yes   Zostavax completed No   Shingrix Completed?: No.    Education has been provided regarding the importance of this vaccine. Patient has been advised to call insurance company to determine out of pocket expense if they have not yet received this vaccine. Advised may also receive vaccine at local pharmacy or Health Dept. Verbalized acceptance and understanding.  Screening Tests Health Maintenance  Topic Date Due  . COVID-19 Vaccine (3 - Pfizer risk 4-dose series) 02/12/2020  . PNA vac Low Risk Adult (2 of 2 - PPSV23) 09/16/2020  . INFLUENZA VACCINE  05/17/2021  . TETANUS/TDAP  09/16/2029  . DEXA SCAN  Completed  . HPV VACCINES  Aged Out    Health Maintenance  Health Maintenance Due  Topic Date Due  . COVID-19 Vaccine (3 - Pfizer risk 4-dose series) 02/12/2020  . PNA vac Low Risk Adult (2 of 2 - PPSV23) 09/16/2020    Colorectal cancer screening: No longer required.   Mammogram status: No longer required due to age.  Bone Density status: Completed 12/06/2017  Lung Cancer Screening: (Low Dose CT Chest recommended if Age 56-80 years, 30 pack-year currently smoking OR have quit w/in 15years.) does not qualify.   Lung Cancer Screening Referral: no  Additional Screening:  Hepatitis C Screening: does not qualify;   Vision Screening: Recommended annual ophthalmology exams for  early detection of glaucoma and other disorders of the eye. Is the patient up to date with their annual eye exam?  Yes  Who is the provider or what is the name of the office in which the patient attends annual eye exams? Dr. Katy Fitch If pt is not established with a provider, would they like to be referred to a provider to establish care? No .   Dental Screening: Recommended annual dental exams for proper oral hygiene  Community Resource Referral / Chronic Care Management: CRR required this visit?  No   CCM required this visit?  No      Plan:     I have personally reviewed and noted the following in the patient's chart:   . Medical and social history . Use of alcohol, tobacco or illicit drugs  . Current medications and supplements including opioid prescriptions.  . Functional ability and status . Nutritional status . Physical activity . Advanced directives . List of other physicians . Hospitalizations, surgeries, and ER visits in previous 12 months . Vitals . Screenings to include cognitive, depression, and falls . Referrals and appointments  In addition, I have reviewed and discussed with patient certain preventive protocols, quality metrics, and best practice recommendations. A written personalized care plan for preventive services as well as general preventive health recommendations were provided to patient.     Kellie Simmering, LPN   3/71/0626   Nurse Notes:

## 2021-02-25 NOTE — Patient Instructions (Signed)
Brianna [name] , Thank you for taking time to come for your Medicare Wellness Visit. I appreciate your ongoing commitment to your health goals. Please review the following plan we discussed and let me know if I can assist you in the future.   Screening recommendations/referrals: Colonoscopy: not required Mammogram: not required Bone Density: completed 12/06/2017 Recommended yearly ophthalmology/optometry visit for glaucoma screening and checkup Recommended yearly dental visit for hygiene and checkup  Vaccinations: Influenza vaccine: due 05/17/2021 Pneumococcal vaccine: due Tdap vaccine: completed 09/17/2019, due 09/16/2029 Shingles vaccine: discussed   Covid-19: 02/09/2021, 01/15/2020, 12/20/2019  Advanced directives: Advance directive discussed with you today.   Conditions/risks identified: none  Next appointment: Follow up in one year for your annual wellness visit    Preventive Care 85 Years and Older,  Female Preventive care refers to lifestyle choices and visits with your health care provider that can promote health and wellness. What does preventive care include?  A yearly physical exam. This is also called an annual well check.  Dental exams once or twice a year.  Routine eye exams. Ask your health care provider how often you should have your eyes checked.  Personal lifestyle choices, including:  Daily care of your teeth and gums.  Regular physical activity.  Eating a healthy diet.  Avoiding tobacco and drug use.  Limiting alcohol use.  Practicing safe sex.  Taking low-dose aspirin every day.  Taking vitamin and mineral supplements as recommended by your health care provider. What happens during an annual well check? The services and screenings done by your health care provider during your annual well check will depend on your age, overall health, lifestyle risk factors, and family history of disease. Counseling  Your health care provider may ask you questions about  your:  Alcohol use.  Tobacco use.  Drug use.  Emotional well-being.  Home and relationship well-being.  Sexual activity.  Eating habits.  History of falls.  Memory and ability to understand (cognition).  Work and work Statistician.  Reproductive health. Screening  You may have the following tests or measurements:  Height, weight, and BMI.  Blood pressure.  Lipid and cholesterol levels. These may be checked every 5 years, or more frequently if you are over 85 years old.  Skin check.  Lung cancer screening. You may have this screening every year starting at age 85 if you have a 30-pack-year history of smoking and currently smoke or have quit within the past 15 years.  Fecal occult blood test (FOBT) of the stool. You may have this test every year starting at age 85.  Flexible sigmoidoscopy or colonoscopy. You may have a sigmoidoscopy every 5 years or a colonoscopy every 10 years starting at age 85.  Hepatitis C blood test.  Hepatitis B blood test.  Sexually transmitted disease (STD) testing.  Diabetes screening. This is done by checking your blood sugar (glucose) after you have not eaten for a while (fasting). You may have this done every 1-3 years.  Bone density scan. This is done to screen for osteoporosis. You may have this done starting at age 85.  Mammogram. This may be done every 1-2 years. Talk to your health care provider about how often you should have regular mammograms. Talk with your health care provider about your test results, treatment options, and if necessary, the need for more tests. Vaccines  Your health care provider may recommend certain vaccines, such as:  Influenza vaccine. This is recommended every year.  Tetanus, diphtheria, and acellular pertussis (Tdap, Td)  vaccine. You may need a Td booster every 10 years.  Zoster vaccine. You may need this after age 85.  Pneumococcal 13-valent conjugate (PCV13) vaccine. One dose is recommended  after age 85.  Pneumococcal polysaccharide (PPSV23) vaccine. One dose is recommended after age 85. Talk to your health care provider about which screenings and vaccines you need and how often you need them. This information is not intended to replace advice given to you by your health care provider. Make sure you discuss any questions you have with your health care provider. Document Released: 10/30/2015 Document Revised: 06/22/2016 Document Reviewed: 08/04/2015 Elsevier Interactive Patient Education  2017 [place] Prevention in the Home Falls can cause injuries. They can happen to people of all ages. There are many things you can do to make your home safe and to help prevent falls. What can I do on the outside of my home?  Regularly fix the edges of walkways and driveways and fix any cracks.  Remove anything that might make you trip as you walk through a door, such as a raised step or threshold.  Trim any bushes or trees on the path to your home.  Use bright outdoor lighting.  Clear any walking paths of anything that might make someone trip, such as rocks or tools.  Regularly check to see if handrails are loose or broken. Make sure that both sides of any steps have handrails.  Any raised decks and porches should have guardrails on the edges.  Have any leaves, snow, or ice cleared regularly.  Use sand or salt on walking paths during winter.  Clean up any spills in your garage right away. This includes oil or grease spills. What can I do in the bathroom?  Use night lights.  Install grab bars by the toilet and in the tub and shower. Do not use towel bars as grab bars.  Use non-skid mats or decals in the tub or shower.  If you need to sit down in the shower, use a plastic, non-slip stool.  Keep the floor dry. Clean up any water that spills on the floor as soon as it happens.  Remove soap buildup in the tub or shower regularly.  Attach bath mats securely with  double-sided non-slip rug tape.  Do not have throw rugs and other things on the floor that can make you trip. What can I do in the bedroom?  Use night lights.  Make sure that you have a light by your bed that is easy to reach.  Do not use any sheets or blankets that are too big for your bed. They should not hang down onto the floor.  Have a firm chair that has side arms. You can use this for support while you get dressed.  Do not have throw rugs and other things on the floor that can make you trip. What can I do in the kitchen?  Clean up any spills right away.  Avoid walking on wet floors.  Keep items that you use a lot in easy-to-reach places.  If you need to reach something above you, use a strong step stool that has a grab bar.  Keep electrical cords out of the way.  Do not use floor polish or wax that makes floors slippery. If you must use wax, use non-skid floor wax.  Do not have throw rugs and other things on the floor that can make you trip. What can I do with my stairs?  Do not leave  any items on the stairs.  Make sure that there are handrails on both sides of the stairs and use them. Fix handrails that are broken or loose. Make sure that handrails are as long as the stairways.  Check any carpeting to make sure that it is firmly attached to the stairs. Fix any carpet that is loose or worn.  Avoid having throw rugs at the top or bottom of the stairs. If you do have throw rugs, attach them to the floor with carpet tape.  Make sure that you have a light switch at the top of the stairs and the bottom of the stairs. If you do not have them, ask someone to add them for you. What else can I do to help prevent falls?  Wear shoes that:  Do not have high heels.  Have rubber bottoms.  Are comfortable and fit you well.  Are closed at the toe. Do not wear sandals.  If you use a stepladder:  Make sure that it is fully opened. Do not climb a closed stepladder.  Make  sure that both sides of the stepladder are locked into place.  Ask someone to hold it for you, if possible.  Clearly mark and make sure that you can see:  Any grab bars or handrails.  First and last steps.  Where the edge of each step is.  Use tools that help you move around (mobility aids) if they are needed. These include:  Canes.  Walkers.  Scooters.  Crutches.  Turn on the lights when you go into a dark area. Replace any light bulbs as soon as they burn out.  Set up your furniture so you have a clear path. Avoid moving your furniture around.  If any of your floors are uneven, fix them.  If there are any pets around you, be aware of where they are.  Review your medicines with your doctor. Some medicines can make you feel dizzy. This can increase your chance of falling. Ask your doctor what other things that you can do to help prevent falls. This information is not intended to replace advice given to you by your health care provider. Make sure you discuss any questions you have with your health care provider. Document Released: 07/30/2009 Document Revised: 03/10/2016 Document Reviewed: 11/07/2014 Elsevier Interactive Patient Education  2017 [employer].

## 2021-03-17 ENCOUNTER — Telehealth: Payer: Medicare HMO

## 2021-03-17 ENCOUNTER — Ambulatory Visit (INDEPENDENT_AMBULATORY_CARE_PROVIDER_SITE_OTHER): Payer: Medicare HMO

## 2021-03-17 DIAGNOSIS — I10 Essential (primary) hypertension: Secondary | ICD-10-CM

## 2021-03-17 DIAGNOSIS — E039 Hypothyroidism, unspecified: Secondary | ICD-10-CM | POA: Diagnosis not present

## 2021-03-17 DIAGNOSIS — N182 Chronic kidney disease, stage 2 (mild): Secondary | ICD-10-CM

## 2021-03-17 DIAGNOSIS — R413 Other amnesia: Secondary | ICD-10-CM

## 2021-03-19 NOTE — Patient Instructions (Signed)
Goals Addressed    . Follow My Treatment Plan-Chronic Kidney   On track    Timeframe:  Long-Range Goal Priority:  Medium Start Date: 01/06/21                            Expected End Date: 01/06/22                      Follow Up Date: 06/17/21     Self Care Activities:  . Continue to adhere to MD recommendations for CKD  . Continue to keep all scheduled follow up appointments . Take medications as directed  . Let your healthcare team know if you are unable to take your medications . Call your pharmacy for refills at least 7 days prior to running out of medication . Increase your water intake unless otherwise directed . Review mailed printed educational materials related to Kidney diease Patient Goals: -maintain adequate kidney function    Why is this important?    Staying as healthy as you can is very important. This may mean making changes if you smoke, don't exercise or eat poorly.   A healthy lifestyle is an important goal for you.   Following the treatment plan and making changes may be hard.   Try some of these steps to help keep the disease from getting worse.     Notes:     [name] Maintain Optimal Cognitive Function   On track    Timeframe:  Long-Range Goal Priority:  High Start Date: 11/04/20                            Expected End Date: 11/04/21       Follow Up Date: 06/17/21  Patient Self-Care Activities:  -continue to follow up with Neurology as directed -allow family to assist with care needs  -continue to utilize a calendar system to help with appointment reminders -continue to allow son to drive you to your appointments and help run errands                    . Maintain Social and Functional Ability   On track    Timeframe:  Long-Range Goal Priority:  High Start Date:  11/04/20                           Expected End Date:  11/04/21  Follow up date: 06/17/21     Patient Goals/Self-Care Activities:  -maintain or improve functional and social abilities   -follow MD  recommendations for ongoing treatment of dementia -keep all MD follow up appointments                      . COMPLETED: Make and Keep All Appointments       Timeframe:  Long-Range Goal Priority:  High Start Date: 11/04/20                            Expected End Date:  02/02/21                     Follow Up Date: 03/17/21   - ask family or friend for a ride - call to cancel if needed - keep a calendar with prescription refill dates - keep a calendar with appointment  dates    Why is this important?    Part of staying healthy is seeing the doctor for follow-up care.   If you forget your appointments, there are some things you can do to stay on track.    Notes:     [name] Matintain My Quality of Life   On track    Timeframe:  Long-Range Goal Priority:  High Start Date:  11/04/20                           Expected End Date:  11/04/21                    Follow Up Date: 06/17/21   Patient Goals/Self-Care Activities:  - ask one of your sons for a ride when needed - call to cancel appointments if needed - keep a calendar with prescription refill dates - keep a calendar with appointment dates - check out options for in-home help if needed - discuss my treatment options with the doctor or nurse - make shared treatment decisions with doctor   Why is this important?    Having a long-term illness can be scary.   It can also be stressful for you and your caregiver.   These steps may help.    Notes:

## 2021-03-19 NOTE — Chronic Care Management (AMB) (Addendum)
Chronic Care Management   CCM RN Visit Note  03/17/2021 Name: Brianna [name] MRN: [id number] DOB: 1933-10-09  Subjective: Brianna [name] is a 87 y.o. year old female who is a primary care patient of [name], MD. The care management team was consulted for assistance with disease management and care coordination needs.    Engaged with patient by telephone for follow up visit in response to provider referral for case management and/or care coordination services.   Consent to Services:  The patient was given information about Chronic Care Management services, agreed to services, and gave verbal consent prior to initiation of services.  Please see initial visit note for detailed documentation.   Patient agreed to services and verbal consent obtained.   Assessment: Review of patient past medical history, allergies, medications, health status, including review of consultants reports, laboratory and other test data, was performed as part of comprehensive evaluation and provision of chronic care management services.   SDOH (Social Determinants of Health) assessments and interventions performed:    CCM Care Plan  Allergies  Allergen Reactions  . Iodine Other (See Comments)    Reaction unknown  . Sulfa Antibiotics Other (See Comments)    Childhood reaction    Outpatient Encounter Medications as of 03/17/2021  Medication Sig  . amLODipine (NORVASC) 5 MG tablet TAKE 1 TABLET BY MOUTH EVERY DAY  . aspirin 81 MG tablet Take 81 mg by mouth daily.  . Cholecalciferol (VITAMIN D3) 125 MCG (5000 UT) CAPS Take 5,000 Units by mouth daily.  (Patient not taking: Reported on 02/25/2021)  . EYSUVIS 0.25 % SUSP Apply to eye. (Patient not taking: Reported on 02/25/2021)  . fish oil-omega-3 fatty acids 1000 MG capsule Take 1 g by mouth daily.  (Patient not taking: Reported on 02/25/2021)  . furosemide (LASIX) 40 MG tablet TAKE 1 TABLET (40 MG TOTAL) BY MOUTH DAILY AS NEEDED FOR FLUID OR EDEMA. SHE  FORGETS TO TAKE IT  . Melatonin 5 MG TABS Take 1 tablet by mouth at bedtime as needed.  . Multiple Vitamin (MULTIVITAMIN WITH MINERALS) TABS Take 1 tablet by mouth daily.  (Patient not taking: Reported on 02/25/2021)  . senna (SENOKOT) 8.6 MG TABS tablet Take 1 tablet by mouth daily as needed for mild constipation.  [name] SYNTHROID 125 MCG tablet TAKE 1 TABLET BY MOUTH MONDAY - SATURDAY  . XIIDRA 5 % SOLN Apply to eye. 2 drops per day (Patient not taking: Reported on 02/25/2021)   No facility-administered encounter medications on file as of 03/17/2021.    Patient Active Problem List   Diagnosis Date Noted  . Overweight with body mass index (BMI) of 27 to 27.9 in adult 02/24/2020  . Primary hypothyroidism 02/24/2020  . Chronic renal disease, stage II 02/24/2020  . Hypertensive nephropathy 02/24/2020  . Cellulitis and abscess of right lower extremity 07/16/2019  . Edema of both lower extremities 07/16/2019  . MCI (mild cognitive impairment) 04/18/2019  . Chronic headache 07/24/2014  . Essential hypertension 12/24/2013  . MGUS (monoclonal gammopathy of unknown significance) 12/10/2013    Conditions to be addressed/monitored:HTN, hearing loss, memory loss, hypothyroidism, chronic renal disease   Care Plan : General Plan of Care (Adult)  Updates made by Lynne Logan, RN since 03/17/2021 12:00 AM    Problem: Quality of Life (General Plan of Care)   Priority: High    Long-Range Goal: Quality of Life Maintained   Start Date: 11/04/2020  Expected End Date: 11/04/2021  Recent Progress: On track  Priority: High  Note:   Current Barriers:   Ineffective Self Health Maintenance  Unable to self administer medications as prescribed  Lacks social connections  Does not maintain contact with provider office  Currently UNABLE TO independently self manage needs related to chronic health conditions.   Knowledge Deficits related to short term plan for care coordination needs and long term plans for  chronic disease management needs Case Manager Clinical Goal(s):  [name] Collaboration with [name], MD regarding development and update of comprehensive plan of care as evidenced by provider attestation and co-signature . Inter-disciplinary care team collaboration (see longitudinal plan of care)  Patient will work with care management team to address care coordination and chronic disease management needs related to Disease Management  Educational Needs  Care Coordination  Medication Management and Education  Psychosocial Support  Dementia and Caregiver Support   Interventions:  03/17/21 completed successful outbound call with patient   Assessed for signs/symptoms of psychosocial concerns, including depression or ideations regarding harm to others or self; provide or refer for mental health services as needed.   Assessed for sensory issues that impact quality of life such as hearing loss, vision deficit; strategize ways to maintain or improve hearing, vision.   Determined patient continues to live with her adult sons who are engaged in her care and providing transportation   Discussed patient feels she is staying healthy and is being well looked after by her sons    Discussed plans with patient for ongoing care management follow up and provided patient with direct contact information for care management team Patient Goals/Self-Care Activities:  - ask one of your sons for a ride when needed - call to cancel appointments if needed - keep a calendar with prescription refill dates - keep a calendar with appointment dates - check out options for in-home help if needed - discuss my treatment options with the doctor or nurse - make shared treatment decisions with doctor  Follow Up Plan: Telephone follow up appointment with care management team member scheduled for: 06/17/21   Care Plan : Dementia (Adult)  Updates made by Lynne Logan, RN since 03/19/2021 12:00 AM    Problem:  Cognitive Function   Priority: High    Long-Range Goal: Optimal Cognitive Function   Start Date: 11/04/2020  Expected End Date: 11/04/2021  Recent Progress: On track  Priority: High  Note:   Current Barriers:   Ineffective Self Health Maintenance  Unable to self administer medications as prescribed  Lacks social connections  Does not contact provider office for questions/concerns  Currently UNABLE TO independently self manage needs related to chronic health conditions.   Knowledge Deficits related to short term plan for care coordination needs and long term plans for chronic disease management needs Case Manager Clinical Goal(s):  [name] Collaboration with [name], MD regarding development and update of comprehensive plan of care as evidenced by provider attestation and co-signature . Inter-disciplinary care team collaboration (see longitudinal plan of care)  Patient will work with care management team to address care coordination and chronic disease management needs related to Disease Management  Educational Needs  Care Coordination  Medication Management and Education  Psychosocial Support  Dementia and Caregiver Support   Interventions:  03/17/21 completed successful outbound call with patient   Encouraged social relationships and physical activity based on ability.   Promoted strategies that will preserve the individual's abilities; focus on strengths and quality of life optimization.   Discussed plans with patient for ongoing care  management follow up and provided patient with direct contact information for care management team Patient Goals/Self-Care Activities:  -follow up with Neurologist as directed for ongoing evaluation/treatment of dementia  -allow family to assist with care needs  -continue to utilize a calendar system to help with appointment reminders -continue to allow son to drive you to your appointments and help run errands   Follow Up Plan:  Telephone follow up appointment with care management team member scheduled for: 06/17/21   Problem: Social and Functional Symptoms   Priority: High    Long-Range Goal: Social and Functional Skills Optimized   Start Date: 11/04/2020  Expected End Date: 11/04/2021  Recent Progress: On track  Priority: High  Note:   Current Barriers:   Ineffective Self Health Maintenance  Unable to self administer medications as prescribed  Lacks social connections  Does not contact provider office for questions/concerns  Currently UNABLE TO independently self manage needs related to chronic health conditions.   Knowledge Deficits related to short term plan for care coordination needs and long term plans for chronic disease management needs Case Manager Clinical Goal(s):  [name] Collaboration with [name], MD regarding development and update of comprehensive plan of care as evidenced by provider attestation and co-signature . Inter-disciplinary care team collaboration (see longitudinal plan of care)  Patient will work with care management team to address care coordination and chronic disease management needs related to Disease Management  Educational Needs  Care Coordination  Medication Management and Education  Psychosocial Support  Dementia and Caregiver Support   Interventions:  03/17/21 completed successful outbound call with patient   Assessed level of social support; promoted maintaining links with family, friends and community to reduce social isolation.   Assessed level of function related to basic activities of daily living that include eating, dressing, bathing, as well as instrumental activities of daily living such as shopping, managing finances and use of devices.   Encouraged continuation of daily life components such as self-care, home maintenance, financial management, volunteer activities, education opportunities and hobbies.   Discussed plans with patient for ongoing care  management follow up and provided patient with direct contact information for care management team Patient Goal/Self-Care Activities:  -maintain or improve functional and social abilities   -follow MD recommendations for ongoing treatment of dementia -keep all MD follow up appointments   Follow Up Plan: Telephone follow up appointment with care management team member scheduled for: 06/17/21   Care Plan : Chronic Kidney (Adult)  Updates made by Lynne Logan, RN since 03/19/2021 12:00 AM    Problem: Disease Progression   Priority: Medium    Long-Range Goal: Disease Progression Prevented or Minimized   Start Date: 01/06/2021  Expected End Date: 01/06/2022  Recent Progress: On track  Priority: Medium  Note:   Objective:  . Last practice recorded BP readings:  BP Readings from Last 3 Encounters:  11/12/20 (!) 167/66  04/23/20 (!) 155/81  02/19/20 118/70   Current Barriers:   Ineffective Self Health Maintenance  Does not attend all scheduled provider appointments  Dementia  Clinical Goal(s):  [name] Collaboration with [name], MD regarding development and update of comprehensive plan of care as evidenced by provider attestation and co-signature . Inter-disciplinary care team collaboration (see longitudinal plan of care)  Patient will work with care management team to address care coordination and chronic disease management needs related to Disease Management  Educational Needs  Care Coordination  Medication Management and Education  Psychosocial Support   Interventions:  03/17/21 completed successful outbound call with patient   Evaluation of current treatment plan related to CKD Stage III , self-management and patient's adherence to plan as established by provider.  Collaboration with [name], MD regarding development and update of comprehensive plan of care as evidenced by provider attestation       and co-signature  Inter-disciplinary care team collaboration  (see longitudinal plan of care) . Provided education to patient about basic CKD disease process . Review of patient status, including review of consultants reports, relevant laboratory and other test results, and medications completed. . Reviewed medications with patient and discussed importance of medication adherence . Educated on importance of drinking plenty of water, discussed drinking 64 oz daily unless otherwise directed, adhering to low Sodium diet and keeping BP well controlled   Discussed plans with patient for ongoing care management follow up and provided patient with direct contact information for care management team Self Care Activities:  . Continue to adhere to MD recommendations for CKD  . Continue to keep all scheduled follow up appointments . Take medications as directed  . Let your healthcare team know if you are unable to take your medications . Call your pharmacy for refills at least 7 days prior to running out of medication . Increase your water intake unless otherwise directed . Review mailed printed educational materials related to Kidney diease Patient Goals: -maintain adequate kidney function   Follow Up Plan: Telephone follow up appointment with care management team member scheduled for: 06/17/21   Plan:Telephone follow up appointment with care management team member scheduled for:  06/17/21  Barb Merino, RN, BSN, CCM Care Management Coordinator [facility]  Direct Phone: [phone]

## 2021-06-17 ENCOUNTER — Telehealth: Payer: Medicare HMO

## 2021-06-17 ENCOUNTER — Ambulatory Visit: Payer: Self-pay

## 2021-06-17 DIAGNOSIS — R413 Other amnesia: Secondary | ICD-10-CM

## 2021-06-17 DIAGNOSIS — I10 Essential (primary) hypertension: Secondary | ICD-10-CM

## 2021-06-17 DIAGNOSIS — N182 Chronic kidney disease, stage 2 (mild): Secondary | ICD-10-CM

## 2021-06-17 DIAGNOSIS — E039 Hypothyroidism, unspecified: Secondary | ICD-10-CM

## 2021-06-17 NOTE — Chronic Care Management (AMB) (Signed)
    Care Management   Follow Up Note   06/17/2021 Name: Brianna [name] MRN: [id number] DOB: 10/15/1933   Referred by: [name], MD Reason for referral : No chief complaint on file.  The patient was referred to the case management team for assistance with care management and care coordination. The CCM team is unable to continue CCM services at this time due to patient is late on having completed her face-to-face office visit with PCP. Last office visit noted with PCP was completed on 02/19/20. The care management team is pleased to engage with this patient at any time in the future following her completion of a face-to-face visit with PCP, should she be interested in assistance from the care management team.   Follow Up Plan: The care management team is available to follow up with the patient after provider conversation with the patient regarding recommendation for care management engagement and subsequent re-referral to the care management team.

## 2021-06-17 NOTE — Patient Instructions (Signed)
Goals Addressed      COMPLETED: Follow My Treatment Plan-Chronic Kidney       Timeframe:  Long-Range Goal Priority:  Medium Start Date: 01/06/21                            Expected End Date: 01/06/22                      Follow Up Date: 06/17/21     Self Care Activities:  Continue to adhere to MD recommendations for CKD  Continue to keep all scheduled follow up appointments Take medications as directed  Let your healthcare team know if you are unable to take your medications Call your pharmacy for refills at least 7 days prior to running out of medication Increase your water intake unless otherwise directed Review mailed printed educational materials related to Kidney diease Patient Goals: -maintain adequate kidney function    Why is this important?   Staying as healthy as you can is very important. This may mean making changes if you smoke, don't exercise or eat poorly.  A healthy lifestyle is an important goal for you.  Following the treatment plan and making changes may be hard.  Try some of these steps to help keep the disease from getting worse.     Notes:      COMPLETED: Maintain Optimal Cognitive Function       Timeframe:  Long-Range Goal Priority:  High Start Date: 11/04/20                            Expected End Date: 11/04/21       Follow Up Date: 06/17/21  Patient Self-Care Activities:  -continue to follow up with Neurology as directed -allow family to assist with care needs  -continue to utilize a calendar system to help with appointment reminders -continue to allow son to drive you to your appointments and help run errands                     COMPLETED: Maintain Social and Functional Ability       Timeframe:  Long-Range Goal Priority:  High Start Date:  11/04/20                           Expected End Date:  11/04/21  Follow up date: 06/17/21     Patient Goals/Self-Care Activities:  -maintain or improve functional and social abilities   -follow MD recommendations  for ongoing treatment of dementia -keep all MD follow up appointments                       COMPLETED: Matintain My Quality of Life       Timeframe:  Long-Range Goal Priority:  High Start Date:  11/04/20                           Expected End Date:  11/04/21                    Follow Up Date: 06/17/21   Patient Goals/Self-Care Activities:  - ask one of your sons for a ride when needed - call to cancel appointments if needed - keep a calendar with prescription refill dates - keep a calendar with appointment dates - check  out options for in-home help if needed - discuss my treatment options with the doctor or nurse - make shared treatment decisions with doctor   Why is this important?   Having a long-term illness can be scary.  It can also be stressful for you and your caregiver.  These steps may help.    Notes:

## 2021-06-18 ENCOUNTER — Telehealth: Payer: Self-pay

## 2021-06-18 NOTE — Telephone Encounter (Signed)
The pt was contacted to schedule her an appt for a physical.  The pt said that she needed an appt for a couple weeks away.  The pt declined to schedule the appt at this time. The pt said wants a reminder call to schedule her visit.

## 2021-08-23 DIAGNOSIS — E039 Hypothyroidism, unspecified: Secondary | ICD-10-CM | POA: Diagnosis not present

## 2021-08-23 DIAGNOSIS — F03C0 Unspecified dementia, severe, without behavioral disturbance, psychotic disturbance, mood disturbance, and anxiety: Secondary | ICD-10-CM | POA: Diagnosis not present

## 2021-08-23 DIAGNOSIS — G4700 Insomnia, unspecified: Secondary | ICD-10-CM | POA: Diagnosis not present

## 2021-08-23 DIAGNOSIS — I10 Essential (primary) hypertension: Secondary | ICD-10-CM | POA: Diagnosis not present

## 2021-08-23 DIAGNOSIS — F0390 Unspecified dementia without behavioral disturbance: Secondary | ICD-10-CM | POA: Diagnosis not present

## 2021-08-24 ENCOUNTER — Other Ambulatory Visit: Payer: Self-pay | Admitting: Internal Medicine

## 2021-09-02 ENCOUNTER — Ambulatory Visit (INDEPENDENT_AMBULATORY_CARE_PROVIDER_SITE_OTHER): Payer: Medicare HMO | Admitting: Nurse Practitioner

## 2021-09-02 ENCOUNTER — Other Ambulatory Visit: Payer: Self-pay

## 2021-09-02 ENCOUNTER — Encounter: Payer: Self-pay | Admitting: Nurse Practitioner

## 2021-09-02 VITALS — BP 134/86 | HR 58 | Temp 98.4°F | Ht 67.0 in | Wt 161.0 lb

## 2021-09-02 DIAGNOSIS — Z23 Encounter for immunization: Secondary | ICD-10-CM

## 2021-09-02 DIAGNOSIS — E039 Hypothyroidism, unspecified: Secondary | ICD-10-CM | POA: Diagnosis not present

## 2021-09-02 DIAGNOSIS — I10 Essential (primary) hypertension: Secondary | ICD-10-CM

## 2021-09-02 DIAGNOSIS — Z13228 Encounter for screening for other metabolic disorders: Secondary | ICD-10-CM

## 2021-09-02 DIAGNOSIS — R7309 Other abnormal glucose: Secondary | ICD-10-CM | POA: Diagnosis not present

## 2021-09-02 MED ORDER — AMLODIPINE BESYLATE 5 MG PO TABS: 5.0000 mg | ORAL_TABLET | Freq: Every day | ORAL | 1 refills | Status: DC

## 2021-09-02 NOTE — Progress Notes (Signed)
I,Katawbba Wiggins,acting as a Education administrator for [employer], NP.,have documented all relevant documentation on the behalf of [employer], NP,as directed by  Bary Castilla, NP while in the presence of Bary Castilla, NP.  This visit occurred during the SARS-CoV-2 public health emergency.  Safety protocols were in place, including screening questions prior to the visit, additional usage of staff PPE, and extensive cleaning of exam room while observing appropriate contact time as indicated for disinfecting solutions.  Subjective:     Patient ID: Brianna [name] , female    DOB: 07-Apr-1933 , 87 y.o.   MRN: [id number]  Chief Complaint  Patient presents with   Hypertension   Hypothyroidism   HPI  The patient is here with her son for a blood pressure and thyroid f/u. She has not been here in a long time. Will check her thyroid today and refill BP medication which she has not taken in a long time.  Diet: eating a lot fruits and vegetables.   Exercise: She walks everyday. No other concerns expressed today.   Hypertension Pertinent negatives include no blurred vision, chest pain, headaches, palpitations or shortness of breath.    Past Medical History:  Diagnosis Date   Headache 12/24/2013   Headache(784.0)    Hypertension    pcp  [facility]   Hypothyroid    MGUS (monoclonal gammopathy of unknown significance) 12/10/2013     Family History  Problem Relation Age of Onset   Cancer Maternal Grandfather        stomach cancer   Cancer Paternal Grandfather        stomach cancer   Healthy Mother    Dementia Mother        at 5-99 y.o   Healthy Father      Current Outpatient Medications:    aspirin 81 MG tablet, Take 81 mg by mouth daily., Disp: , Rfl:    Cholecalciferol (VITAMIN D3) 125 MCG (5000 UT) CAPS, Take 5,000 Units by mouth daily., Disp: , Rfl:    fish oil-omega-3 fatty acids 1000 MG capsule, Take 1 g by mouth daily., Disp: , Rfl:    furosemide (LASIX) 40 MG  tablet, TAKE 1 TABLET (40 MG TOTAL) BY MOUTH DAILY AS NEEDED FOR FLUID OR EDEMA. SHE FORGETS TO TAKE IT, Disp: 90 tablet, Rfl: 0   Multiple Vitamin (MULTIVITAMIN WITH MINERALS) TABS, Take 1 tablet by mouth daily., Disp: , Rfl:    senna (SENOKOT) 8.6 MG TABS tablet, Take 1 tablet by mouth daily as needed for mild constipation., Disp: , Rfl:    SYNTHROID 125 MCG tablet, TAKE 1 TABLET BY MOUTH MONDAY - SATURDAY, Disp: 30 tablet, Rfl: 0   amLODipine (NORVASC) 5 MG tablet, Take 1 tablet (5 mg total) by mouth daily., Disp: 90 tablet, Rfl: 1   Melatonin 5 MG TABS, Take 1 tablet by mouth at bedtime as needed., Disp: , Rfl:    Allergies  Allergen Reactions   Iodine Other (See Comments)    Reaction unknown   Sulfa Antibiotics Other (See Comments)    Childhood reaction     Review of Systems  Constitutional: Negative.  Negative for chills and fever.  HENT:  Negative for congestion, sinus pressure and sinus pain.   Eyes:  Negative for blurred vision.  Respiratory: Negative.  Negative for cough, shortness of breath and wheezing.   Cardiovascular: Negative.  Negative for chest pain and palpitations.  Gastrointestinal: Negative.  Negative for constipation and diarrhea.  Neurological: Negative.  Negative for weakness,  numbness and headaches.  Psychiatric/Behavioral: Negative.    All other systems reviewed and are negative.   Today's Vitals   09/02/21 1543  BP: 134/86  Pulse: (!) 58  Temp: 98.4 F (36.9 C)  Weight: 161 lb (73 kg)  Height: _0  (1.702 m)  PainSc: 0-No pain   Body mass index is 25.22 kg/m.  Wt Readings from Last 3 Encounters:  09/02/21 161 lb (73 kg)  02/25/21 160 lb (72.6 kg)  11/12/20 159 lb 3.2 oz (72.2 kg)    BP Readings from Last 3 Encounters:  09/02/21 134/86  11/12/20 (!) 167/66  04/23/20 (!) 155/81    Objective:  Physical Exam Constitutional:      Appearance: Normal appearance.  HENT:     Head: Normocephalic and atraumatic.  Cardiovascular:     Rate and  Rhythm: Normal rate and regular rhythm.     Pulses: Normal pulses.     Heart sounds: Normal heart sounds. No murmur heard. Pulmonary:     Effort: Pulmonary effort is normal. No respiratory distress.     Breath sounds: Normal breath sounds. No wheezing.  Skin:    General: Skin is warm and dry.     Capillary Refill: Capillary refill takes less than 2 seconds.  Neurological:     Mental Status: She is alert.     Assessment And Plan:     1. Essential hypertension -Chronic, stable.  -Will refill amlodipine as she has not taken it in a long time  - CMP14+EGFR - CBC no Diff - amLODipine (NORVASC) 5 MG tablet; Take 1 tablet (5 mg total) by mouth daily.  Dispense: 90 tablet; Refill: 1  2. Primary hypothyroidism -She was out of her synthroid 125 mcg but started taking it when the refill was sent.  -Will check her labs and reassess.  - TSH + free T4  3. Encounter for screening for metabolic disorder -Will check and assises  - Hemoglobin A1c  4. Need for immunization against influenza - Flu Vaccine QUAD High Dose(Fluad)   The patient was encouraged to call or send a message through [place] for any questions or concerns.   Follow up: 3 months for physical exam   Side effects and appropriate use of all the medication(s) were discussed with the patient today. Patient advised to use the medication(s) as directed by their healthcare provider. The patient was encouraged to read, review, and understand all associated package inserts and contact our office with any questions or concerns. The patient accepts the risks of the treatment plan and had an opportunity to ask questions.   Staying healthy and adopting a healthy lifestyle for your overall health is important. You should eat 7 or more servings of fruits and vegetables per day. You should drink plenty of water to keep yourself hydrated and your kidneys healthy. This includes about 65-80+ fluid ounces of water. Limit your intake of animal fats  especially for elevated cholesterol. Avoid highly processed food and limit your salt intake if you have hypertension. Avoid foods high in saturated/Trans fats. Along with a healthy diet it is also very important to maintain time for yourself to maintain a healthy mental health with low stress levels. You should get atleast 150 min of moderate intensity exercise weekly for a healthy heart. Along with eating right and exercising, aim for at least 7-9 hours of sleep daily.  Eat more whole grains which includes barley, wheat berries, oats, brown rice and whole wheat pasta. Use healthy plant oils which  include olive, soy, corn, sunflower and peanut. Limit your caffeine and sugary drinks. Limit your intake of fast foods. Limit milk and dairy products to one or two daily servings.   Patient was given opportunity to ask questions. Patient verbalized understanding of the plan and was able to repeat key elements of the plan. All questions were answered to their satisfaction.  Raman Kaylanie Capili, DNP   I, Raman Yurani Fettes have reviewed all documentation for this visit. The documentation on 02/25/21 for the exam, diagnosis, procedures, and orders are all accurate and complete.   IF YOU HAVE BEEN REFERRED TO A SPECIALIST, IT MAY TAKE 1-2 WEEKS TO SCHEDULE/PROCESS THE REFERRAL. IF YOU HAVE NOT HEARD FROM US/SPECIALIST IN TWO WEEKS, PLEASE GIVE [place] A CALL AT [phone].   THE PATIENT IS ENCOURAGED TO PRACTICE SOCIAL DISTANCING DUE TO THE COVID-19 PANDEMIC.

## 2021-09-03 LAB — CBC
Hematocrit: 38.9 % (ref 34.0–46.6)
Hemoglobin: 13 g/dL (ref 11.1–15.9)
MCH: 32.3 pg (ref 26.6–33.0)
MCHC: 33.4 g/dL (ref 31.5–35.7)
MCV: 97 fL (ref 79–97)
Platelets: 202 10*3/uL (ref 150–450)
RBC: 4.02 x10E6/uL (ref 3.77–5.28)
RDW: 13.5 % (ref 11.7–15.4)
WBC: 4.5 10*3/uL (ref 3.4–10.8)

## 2021-09-03 LAB — CMP14+EGFR
ALT: 13 IU/L (ref 0–32)
AST: 25 IU/L (ref 0–40)
Albumin/Globulin Ratio: 1.3 (ref 1.2–2.2)
Albumin: 4.4 g/dL (ref 3.6–4.6)
Alkaline Phosphatase: 65 IU/L (ref 44–121)
BUN/Creatinine Ratio: 20 (ref 12–28)
BUN: 23 mg/dL (ref 8–27)
Bilirubin Total: 0.2 mg/dL (ref 0.0–1.2)
CO2: 27 mmol/L (ref 20–29)
Calcium: 9.4 mg/dL (ref 8.7–10.3)
Chloride: 99 mmol/L (ref 96–106)
Creatinine, Ser: 1.17 mg/dL — ABNORMAL HIGH (ref 0.57–1.00)
Globulin, Total: 3.5 g/dL (ref 1.5–4.5)
Glucose: 83 mg/dL (ref 70–99)
Potassium: 3.9 mmol/L (ref 3.5–5.2)
Sodium: 141 mmol/L (ref 134–144)
Total Protein: 7.9 g/dL (ref 6.0–8.5)
eGFR: 45 mL/min/{1.73_m2} — ABNORMAL LOW (ref >59)

## 2021-09-03 LAB — TSH+FREE T4
Free T4: 0.41 ng/dL — ABNORMAL LOW (ref 0.82–1.77)
TSH: 117 u[IU]/mL — ABNORMAL HIGH (ref 0.450–4.500)

## 2021-09-03 LAB — HEMOGLOBIN A1C
Est. average glucose Bld gHb Est-mCnc: 123 mg/dL
Hgb A1c MFr Bld: 5.9 % — ABNORMAL HIGH (ref 4.8–5.6)

## 2021-09-07 ENCOUNTER — Other Ambulatory Visit: Payer: Self-pay | Admitting: Nurse Practitioner

## 2021-09-07 ENCOUNTER — Other Ambulatory Visit: Payer: Self-pay

## 2021-09-07 DIAGNOSIS — E039 Hypothyroidism, unspecified: Secondary | ICD-10-CM

## 2021-09-07 DIAGNOSIS — Z789 Other specified health status: Secondary | ICD-10-CM

## 2021-09-07 NOTE — Progress Notes (Signed)
Please reschedule her for 6-8 weeks instead for a lab visit for her TSH

## 2021-09-07 NOTE — Progress Notes (Signed)
Brianna [name] came for a visit after a very long time with her son. She had not been taking her levothyroxine in a very long time and the son had recently picked it up and restarted her on it. She is taking 125 mcg. Her TSH is extremely high. Should I keep her on 125 mcg and get her to come in 2-3 weeks for a repeat TSH? I wasn't sure since she had not taken it in a long time to keep her at that same dose or not?  Please advise.

## 2021-09-07 NOTE — Progress Notes (Signed)
Talked to patient's son. He stated that the patient had already started the levothyroxine 125 mcg before she even came to the office visit on 11/17. Advised the son that since she had not taken it in a very long time, she should of started it at a lower dose. We will schedule the patient to come in 6-8 weeks for a recheck of her TSH.

## 2021-09-14 NOTE — Progress Notes (Signed)
Thank you she is scheduled to come in on 12/13 for a recheck.

## 2021-09-15 ENCOUNTER — Encounter: Payer: Self-pay | Admitting: *Deleted

## 2021-09-18 DIAGNOSIS — E039 Hypothyroidism, unspecified: Secondary | ICD-10-CM | POA: Diagnosis not present

## 2021-09-18 DIAGNOSIS — I10 Essential (primary) hypertension: Secondary | ICD-10-CM | POA: Diagnosis not present

## 2021-09-19 ENCOUNTER — Other Ambulatory Visit: Payer: Self-pay | Admitting: Internal Medicine

## 2021-09-20 NOTE — Chronic Care Management (AMB) (Signed)
Error

## 2021-09-28 ENCOUNTER — Other Ambulatory Visit: Payer: Medicare HMO

## 2021-10-26 ENCOUNTER — Telehealth: Payer: Medicare HMO

## 2021-10-26 ENCOUNTER — Telehealth: Payer: Self-pay

## 2021-10-26 NOTE — Telephone Encounter (Signed)
°  Care Management   Follow Up Note   10/26/2021 Name: Brianna [name] MRN: [id number] DOB: 06/11/1933   Referred by: [name], MD Reason for referral : Chronic Care Management (RN CM Follow up call )   An unsuccessful telephone outreach was attempted today. The patient was referred to the case management team for assistance with care management and care coordination.   Follow Up Plan: Telephone follow up appointment with care management team member scheduled for: 11/16/21  Barb Merino, RN, BSN, CCM Care Management Coordinator [place] [facility]  Direct Phone: [phone]

## 2021-11-12 ENCOUNTER — Other Ambulatory Visit (HOSPITAL_BASED_OUTPATIENT_CLINIC_OR_DEPARTMENT_OTHER): Payer: Medicare HMO | Admitting: Hematology and Oncology

## 2021-11-12 ENCOUNTER — Inpatient Hospital Stay: Payer: Medicare HMO | Admitting: Hematology and Oncology

## 2021-11-12 ENCOUNTER — Ambulatory Visit: Payer: Medicare HMO | Admitting: Hematology and Oncology

## 2021-11-12 ENCOUNTER — Other Ambulatory Visit: Payer: Medicare HMO

## 2021-11-12 ENCOUNTER — Inpatient Hospital Stay: Payer: Medicare HMO | Attending: Hematology and Oncology

## 2021-11-12 DIAGNOSIS — D472 Monoclonal gammopathy: Secondary | ICD-10-CM | POA: Insufficient documentation

## 2021-11-12 NOTE — Progress Notes (Signed)
No show

## 2021-11-16 ENCOUNTER — Telehealth: Payer: Medicare HMO

## 2021-11-16 ENCOUNTER — Ambulatory Visit (INDEPENDENT_AMBULATORY_CARE_PROVIDER_SITE_OTHER): Payer: Medicare HMO

## 2021-11-16 DIAGNOSIS — I10 Essential (primary) hypertension: Secondary | ICD-10-CM

## 2021-11-16 DIAGNOSIS — H9190 Unspecified hearing loss, unspecified ear: Secondary | ICD-10-CM

## 2021-11-16 DIAGNOSIS — E039 Hypothyroidism, unspecified: Secondary | ICD-10-CM

## 2021-11-16 DIAGNOSIS — N182 Chronic kidney disease, stage 2 (mild): Secondary | ICD-10-CM

## 2021-11-16 NOTE — Chronic Care Management (AMB) (Signed)
Chronic Care Management   CCM RN Visit Note  11/16/2021 Name: Brianna [name] MRN: [id number] DOB: Jul 08, 1933  Subjective: Brianna [name] is a 88 y.o. year old female who is a primary care patient of Brianna Chard, MD. Brianna care management team was consulted for assistance with disease management and care coordination needs.    Engaged with patient by telephone for follow up visit in response to provider referral for case management and/or care coordination services.   Consent to Services:  Brianna patient was given information about Chronic Care Management services, agreed to services, and gave verbal consent prior to initiation of services.  Please see initial visit note for detailed documentation.   Patient agreed to services and verbal consent obtained.   Assessment: Review of patient past medical history, allergies, medications, health status, including review of consultants reports, laboratory and other test data, was performed as part of comprehensive evaluation and provision of chronic care management services.   SDOH (Social Determinants of Health) assessments and interventions performed:  Yes, no acute needs   CCM Care Plan  Allergies  Allergen Reactions   Iodine Other (See Comments)    Reaction unknown   Sulfa Antibiotics Other (See Comments)    Childhood reaction    Outpatient Encounter Medications as of 11/16/2021  Medication Sig   amLODipine (NORVASC) 5 MG tablet Take 1 tablet (5 mg total) by mouth daily.   aspirin 81 MG tablet Take 81 mg by mouth daily.   Cholecalciferol (VITAMIN D3) 125 MCG (5000 UT) CAPS Take 5,000 Units by mouth daily.   fish oil-omega-3 fatty acids 1000 MG capsule Take 1 g by mouth daily.   furosemide (LASIX) 40 MG tablet TAKE 1 TABLET (40 MG TOTAL) BY MOUTH DAILY AS NEEDED FOR FLUID OR EDEMA. SHE FORGETS TO TAKE IT   Melatonin 5 MG TABS Take 1 tablet by mouth at bedtime as needed.   Multiple Vitamin (MULTIVITAMIN WITH MINERALS) TABS Take 1  tablet by mouth daily.   senna (SENOKOT) 8.6 MG TABS tablet Take 1 tablet by mouth daily as needed for mild constipation.   SYNTHROID 125 MCG tablet TAKE 1 TABLET BY MOUTH MONDAY - SATURDAY   No facility-administered encounter medications on file as of 11/16/2021.    Patient Active Problem List   Diagnosis Date Noted   Overweight with body mass index (BMI) of 27 to 27.9 in adult 02/24/2020   Primary hypothyroidism 02/24/2020   Chronic renal disease, stage II 02/24/2020   Hypertensive nephropathy 02/24/2020   Cellulitis and abscess of right lower extremity 07/16/2019   Edema of both lower extremities 07/16/2019   MCI (mild cognitive impairment) 04/18/2019   Chronic headache 07/24/2014   Essential hypertension 12/24/2013   MGUS (monoclonal gammopathy of unknown significance) 12/10/2013    Conditions to be addressed/monitored: HTN, hearing loss, memory loss, hypothyroidism, chronic renal disease   Care Plan : RN Care Manager Plan of Care  Updates made by Brianna Logan, RN since 11/16/2021 12:00 AM     Problem: No plan of care established for management of chronic disease states (HTN, hearing loss, memory loss, hypothyroidism, chronic renal disease)   Priority: High     Goal: Establishment of plan of care for management of chronic disease states (HTN, hearing loss, memory loss, hypothyroidism, chronic renal disease)   Start Date: 11/16/2021  Expected End Date: 12/14/2021  This Visit's Progress: On track  Priority: High  Note:   Current Barriers:  Knowledge Deficits related to plan of care  for management of HTN, hearing loss, memory loss, hypothyroidism, chronic renal disease   Chronic Disease Management support and education needs related to HTN, hearing loss, memory loss, hypothyroidism, chronic renal disease    RNCM Clinical Goal(s):  Patient will verbalize understanding of plan for management of HTN, hearing loss, memory loss, hypothyroidism, chronic renal disease  as evidenced  by patient will verbalize establishment with Brianna [name]  through collaboration with Consulting civil engineer, provider, and care team.   Interventions: 1:1 collaboration with primary care provider regarding development and update of comprehensive plan of care as evidenced by provider attestation and co-signature Inter-disciplinary care team collaboration (see longitudinal plan of care) Evaluation of current treatment plan related to  self management and patient's adherence to plan as established by provider   Health Maintenance Interventions:  (Status:  New goal.) Short Term Goal Determined Brianna [name] placed referral to Brianna [name] for patient to receive PCP services in her home due to having increased difficulty making her MD appointments  Determined patient does not recall receiving a home visit from this provider, although, she states she would like to have a PCP who can provide care in her home Placed outbound call to Brianna [name] office, spoke with Brianna [name] who confirmed this patient received her initial visit last November, however a subsequent visit was cancelled by patient's brother and services were declined  Advised Brianna [name] this office is not familiar with Brianna [name] brother and Brianna only person listed on her chart and dpr is her son Brianna [name]., with whom she lives with  Advised this patient and her son Brianna [name] agree that Brianna [name] is a better option for patient in order to receive health care in Brianna home verses having to leave her home to go into an office setting Provided Brianna [name] with patient's son as POC and his contact number in order to call and re-establish patient care Placed outbound call to patient and son Brianna [name]., advised Theadora [name] from Brianna [name] will contact them by Brianna end of Brianna week to schedule a home visit in order to get re-established with this provider, Brianna patient and her son verbalize understanding Discussed this RN CM will  contact Brianna patient next week to ensure she was able to schedule a home visit with Brianna [name], patient and son are agreeable   Patient Goals/Self-Care Activities: Take all medications as prescribed Attend all scheduled provider appointments Call pharmacy for medication refills 3-7 days in advance of running out of medications Call provider office for new concerns or questions   Follow Up Plan:  Telephone follow up appointment with care management team member scheduled for:  11/25/21      Plan:Telephone follow up appointment with care management team member scheduled for:  11/25/21  Barb Merino, RN, BSN, CCM Care Management Coordinator [place] [facility]  Direct Phone: [phone]

## 2021-11-16 NOTE — Patient Instructions (Signed)
Visit Information  Thank you for taking time to visit with me today. Please don't hesitate to contact me if I can be of assistance to you before our next scheduled telephone appointment.  Following are the goals we discussed today:  (Copy and paste patient goals from clinical care plan here)  Our next appointment is by telephone on 11/25/21 at 9:00 AM  Please call the care guide team at [phone] if you need to cancel or reschedule your appointment.   If you are experiencing a Mental Health or [place] or need someone to talk to, please call 1-800-273-TALK (toll free, 24 hour hotline)   The patient verbalized understanding of instructions, educational materials, and care plan provided today and agreed to receive a mailed copy of patient instructions, educational materials, and care plan.   Barb Merino, RN, BSN, CCM Care Management Coordinator [place] [facility]  Direct Phone: [phone]

## 2021-11-25 ENCOUNTER — Telehealth: Payer: Medicare HMO

## 2021-11-25 ENCOUNTER — Ambulatory Visit (INDEPENDENT_AMBULATORY_CARE_PROVIDER_SITE_OTHER): Payer: Medicare HMO

## 2021-11-25 DIAGNOSIS — N182 Chronic kidney disease, stage 2 (mild): Secondary | ICD-10-CM

## 2021-11-25 DIAGNOSIS — I10 Essential (primary) hypertension: Secondary | ICD-10-CM

## 2021-11-25 DIAGNOSIS — H9190 Unspecified hearing loss, unspecified ear: Secondary | ICD-10-CM

## 2021-11-25 DIAGNOSIS — E039 Hypothyroidism, unspecified: Secondary | ICD-10-CM

## 2021-11-25 NOTE — Chronic Care Management (AMB) (Signed)
Chronic Care Management   CCM RN Visit Note  11/25/2021 Name: Brianna [name] MRN: [id number] DOB: Aug 07, 1933  Subjective: Brianna [name] is a 88 y.o. year old female who is a primary care patient of Brianna Chard, MD. The care management team was consulted for assistance with disease management and care coordination needs.    Engaged with patient by telephone for follow up visit in response to provider referral for case management and/or care coordination services.   Consent to Services:  The patient was given information about Chronic Care Management services, agreed to services, and gave verbal consent prior to initiation of services.  Please see initial visit note for detailed documentation.   Patient agreed to services and verbal consent obtained.   Assessment: Review of patient past medical history, allergies, medications, [name] status, including review of consultants reports, laboratory and other test data, was performed as part of comprehensive evaluation and provision of chronic care management services.   SDOH (Social Determinants of [name]) assessments and interventions performed:    CCM Care Plan  Allergies  Allergen Reactions   Iodine Other (See Comments)    Reaction unknown   Sulfa Antibiotics Other (See Comments)    Childhood reaction    Outpatient Encounter Medications as of 11/25/2021  Medication Sig   amLODipine (NORVASC) 5 MG tablet Take 1 tablet (5 mg total) by mouth daily.   aspirin 81 MG tablet Take 81 mg by mouth daily.   Cholecalciferol (VITAMIN D3) 125 MCG (5000 UT) CAPS Take 5,000 Units by mouth daily.   fish oil-omega-3 fatty acids 1000 MG capsule Take 1 g by mouth daily.   furosemide (LASIX) 40 MG tablet TAKE 1 TABLET (40 MG TOTAL) BY MOUTH DAILY AS NEEDED FOR FLUID OR EDEMA. SHE FORGETS TO TAKE IT   Melatonin 5 MG TABS Take 1 tablet by mouth at bedtime as needed.   Multiple Vitamin (MULTIVITAMIN WITH MINERALS) TABS Take 1 tablet by mouth daily.    senna (SENOKOT) 8.6 MG TABS tablet Take 1 tablet by mouth daily as needed for mild constipation.   SYNTHROID 125 MCG tablet TAKE 1 TABLET BY MOUTH MONDAY - SATURDAY   No facility-administered encounter medications on file as of 11/25/2021.    Patient Active Problem List   Diagnosis Date Noted   Overweight with body mass index (BMI) of 27 to 27.9 in adult 02/24/2020   Primary hypothyroidism 02/24/2020   Chronic renal disease, stage II 02/24/2020   Hypertensive nephropathy 02/24/2020   Cellulitis and abscess of right lower extremity 07/16/2019   Edema of both lower extremities 07/16/2019   MCI (mild cognitive impairment) 04/18/2019   Chronic headache 07/24/2014   Essential hypertension 12/24/2013   MGUS (monoclonal gammopathy of unknown significance) 12/10/2013    Conditions to be addressed/monitored: HTN, hearing loss, memory loss, hypothyroidism, chronic renal disease   Care Plan : RN Care Manager Plan of Care  Updates made by Brianna Logan, RN since 11/25/2021 12:00 AM     Problem: No plan of care established for management of chronic disease states (HTN, hearing loss, memory loss, hypothyroidism, chronic renal disease)   Priority: High     Long-Range Goal: Establishment of plan of care for management of chronic disease states (HTN, hearing loss, memory loss, hypothyroidism, chronic renal disease)   Start Date: 11/16/2021  Expected End Date: 12/14/2021  Recent Progress: On track  Priority: High  Note:   Current Barriers:  Knowledge Deficits related to plan of care for management of HTN,  hearing loss, memory loss, hypothyroidism, chronic renal disease   Chronic Disease Management support and education needs related to HTN, hearing loss, memory loss, hypothyroidism, chronic renal disease    RNCM Clinical Goal(s):  Patient will verbalize understanding of plan for management of HTN, hearing loss, memory loss, hypothyroidism, chronic renal disease  as evidenced by patient will  verbalize establishment with Brianna [name]  through collaboration with Consulting civil engineer, provider, and care team.   Interventions: 1:1 collaboration with primary care provider regarding development and update of comprehensive plan of care as evidenced by provider attestation and co-signature Inter-disciplinary care team collaboration (see longitudinal plan of care) Evaluation of current treatment plan related to  self management and patient's adherence to plan as established by provider  [name] Maintenance Interventions: (Status:  Goal on track:  Yes.) Short Term Goal Determined Brianna [name] placed referral to Brianna [name] for patient to receive PCP services in her home due to having increased difficulty making her MD appointments  Placed unsuccessful outbound call to Brianna [name] office, unable to speak with a staff member to confirm establishment with patient for primary care Placed successful outbound call to patient and son Brianna Gurney., determined patient and son recalled having a home visit from a nurse working with Brianna [name] in which the patient's brother was present Discussed they were not happy with how the visit went for Brianna [name] and did not feel the nurse visiting was competent, they do not wish to work with this provider moving forward Discussed patient does prefer a primary provider who can make home visits and has requested a referral be sent to an alternate provider if approved by Brianna [name] Sent secure message to Brianna [name] requesting a referral be placed to Brianna [name]  Discussed this RN CM will contact the patient next week to ensure she was able to schedule a home visit with Brianna [name], patient and son are agreeable Placed outbound call to Brianna [name], left a voice message advising this patient would like to receive services for primary care Provided patient demographics, left this RN CM contact number and requested a call back for  case collaboration    Patient Goals/Self-Care Activities: Take all medications as prescribed Attend all scheduled provider appointments Call pharmacy for medication refills 3-7 days in advance of running out of medications Call provider office for new concerns or questions   Follow Up Plan:  Telephone follow up appointment with care management team member scheduled for:  12/10/21      Plan:Telephone follow up appointment with care management team member scheduled for:  12/10/21  Barb Merino, RN, BSN, CCM Care Management Coordinator [place] [facility]  Direct Phone: [phone]

## 2021-11-25 NOTE — Patient Instructions (Signed)
Visit Information  Thank you for taking time to visit with me today. Please don't hesitate to contact me if I can be of assistance to you before our next scheduled telephone appointment.  Following are the goals we discussed today:  (Copy and paste patient goals from clinical care plan here)  Our next appointment is by telephone on 12/10/21 at 2:05 PM  Please call the care guide team at [phone] if you need to cancel or reschedule your appointment.   If you are experiencing a Mental Health or [place] or need someone to talk to, please call 1-800-273-TALK (toll free, 24 hour hotline)   The patient verbalized understanding of instructions, educational materials, and care plan provided today and agreed to receive a mailed copy of patient instructions, educational materials, and care plan.   Barb Merino, RN, BSN, CCM Care Management Coordinator [place] [facility]  Direct Phone: [phone]

## 2021-12-06 ENCOUNTER — Telehealth: Payer: Self-pay | Admitting: Internal Medicine

## 2021-12-06 NOTE — Telephone Encounter (Signed)
Left message for patient to call back and schedule Medicare Annual Wellness Visit (AWV) either virtually or in office.  Left both my jabber number [phone] and office number    Last AWV 02/25/21  please schedule at anytime with [facility]    This should be a 45 minute visit.   Awvs can be scheduled calendar year   [place]

## 2021-12-07 ENCOUNTER — Telehealth: Payer: Medicare HMO

## 2021-12-07 ENCOUNTER — Ambulatory Visit: Payer: Self-pay

## 2021-12-07 DIAGNOSIS — E039 Hypothyroidism, unspecified: Secondary | ICD-10-CM

## 2021-12-07 DIAGNOSIS — N182 Chronic kidney disease, stage 2 (mild): Secondary | ICD-10-CM

## 2021-12-07 DIAGNOSIS — I10 Essential (primary) hypertension: Secondary | ICD-10-CM

## 2021-12-07 DIAGNOSIS — R413 Other amnesia: Secondary | ICD-10-CM

## 2021-12-07 DIAGNOSIS — H9190 Unspecified hearing loss, unspecified ear: Secondary | ICD-10-CM

## 2021-12-07 NOTE — Chronic Care Management (AMB) (Signed)
Chronic Care Management   CCM RN Visit Note  12/07/2021 Name: Brianna [name] MRN: [id number] DOB: January 19, 1933  Subjective: Brianna [name] is a 88 y.o. year old female who is a primary care patient of Brianna Chard, MD. The care management team was consulted for assistance with disease management and care coordination needs.    Engaged with patient by telephone for follow up visit in response to provider referral for case management and/or care coordination services.   Consent to Services:  The patient was given information about Chronic Care Management services, agreed to services, and gave verbal consent prior to initiation of services.  Please see initial visit note for detailed documentation.   Patient agreed to services and verbal consent obtained.   Assessment: Review of patient past medical history, allergies, medications, health status, including review of consultants reports, laboratory and other test data, was performed as part of comprehensive evaluation and provision of chronic care management services.   SDOH (Social Determinants of Health) assessments and interventions performed:  yes, no acute challenges   CCM Care Plan  Allergies  Allergen Reactions   Iodine Other (See Comments)    Reaction unknown   Sulfa Antibiotics Other (See Comments)    Childhood reaction    Outpatient Encounter Medications as of 12/07/2021  Medication Sig   amLODipine (NORVASC) 5 MG tablet Take 1 tablet (5 mg total) by mouth daily.   aspirin 81 MG tablet Take 81 mg by mouth daily.   Cholecalciferol (VITAMIN D3) 125 MCG (5000 UT) CAPS Take 5,000 Units by mouth daily.   fish oil-omega-3 fatty acids 1000 MG capsule Take 1 g by mouth daily.   furosemide (LASIX) 40 MG tablet TAKE 1 TABLET (40 MG TOTAL) BY MOUTH DAILY AS NEEDED FOR FLUID OR EDEMA. SHE FORGETS TO TAKE IT   Melatonin 5 MG TABS Take 1 tablet by mouth at bedtime as needed.   Multiple Vitamin (MULTIVITAMIN WITH MINERALS) TABS Take  1 tablet by mouth daily.   senna (SENOKOT) 8.6 MG TABS tablet Take 1 tablet by mouth daily as needed for mild constipation.   SYNTHROID 125 MCG tablet TAKE 1 TABLET BY MOUTH MONDAY - SATURDAY   No facility-administered encounter medications on file as of 12/07/2021.    Patient Active Problem List   Diagnosis Date Noted   Overweight with body mass index (BMI) of 27 to 27.9 in adult 02/24/2020   Primary hypothyroidism 02/24/2020   Chronic renal disease, stage II 02/24/2020   Hypertensive nephropathy 02/24/2020   Cellulitis and abscess of right lower extremity 07/16/2019   Edema of both lower extremities 07/16/2019   MCI (mild cognitive impairment) 04/18/2019   Chronic headache 07/24/2014   Essential hypertension 12/24/2013   MGUS (monoclonal gammopathy of unknown significance) 12/10/2013    Conditions to be addressed/monitored: HTN, hearing loss, memory loss, hypothyroidism, chronic renal disease   Care Plan : RN Care Manager Plan of Care  Updates made by Brianna Logan, RN since 12/07/2021 12:00 AM     Problem: No plan of care established for management of chronic disease states (HTN, hearing loss, memory loss, hypothyroidism, chronic renal disease)   Priority: High     Long-Range Goal: Establishment of plan of care for management of chronic disease states (HTN, hearing loss, memory loss, hypothyroidism, chronic renal disease)   Start Date: 11/16/2021  Expected End Date: 12/14/2021  Recent Progress: On track  Priority: High  Note:   Current Barriers:  Knowledge Deficits related to plan of care  for management of HTN, hearing loss, memory loss, hypothyroidism, chronic renal disease   Chronic Disease Management support and education needs related to HTN, hearing loss, memory loss, hypothyroidism, chronic renal disease    RNCM Clinical Goal(s):  Patient will verbalize understanding of plan for management of HTN, hearing loss, memory loss, hypothyroidism, chronic renal disease  as  evidenced by patient will verbalize establishment with Brianna [name]  through collaboration with Consulting civil engineer, provider, and care team.   Interventions: 1:1 collaboration with primary care provider regarding development and update of comprehensive plan of care as evidenced by provider attestation and co-signature Inter-disciplinary care team collaboration (see longitudinal plan of care) Evaluation of current treatment plan related to  self management and patient's adherence to plan as established by provider  Health Maintenance Interventions: (Status:  Goal on track:  Yes.) Short Term Goal Received inbound call from [place] with [facility], voice message received advising Brianna [name] declined having [facility] work with her due to she stated having someone come out to her home to provide primary care Discussed with Brianna [name] that Brianna [name] is confused and forgetting that she did not like the experience she had with Brianna [name] nurse and so patient and her family declined using Brianna [name] Discussed speaking with Brianna [name] and her son Brianna [name]., about [facility] and they agreed to trying this provider Discussed having Brianna [name] reach out to patient's son Brianna [name]., to discuss having [facility] provide primary care and provided Brianna [name] with the contact number for patient's son Brianna [name].  Discussed having Brianna [name] call this RN CM to advise of the outcome once she is able to speak with son Brianna [name].   Patient Goals/Self-Care Activities: Take all medications as prescribed Attend all scheduled provider appointments Call pharmacy for medication refills 3-7 days in advance of running out of medications Call provider office for new concerns or questions   Follow Up Plan:  Telephone follow up appointment with care management team member scheduled for:  12/16/21     Brianna Merino, RN, BSN, CCM Care Management Coordinator [place] [facility]  Direct Phone: [phone]

## 2021-12-10 ENCOUNTER — Telehealth: Payer: Medicare HMO

## 2021-12-14 DIAGNOSIS — I10 Essential (primary) hypertension: Secondary | ICD-10-CM

## 2021-12-14 DIAGNOSIS — E039 Hypothyroidism, unspecified: Secondary | ICD-10-CM

## 2021-12-14 DIAGNOSIS — N182 Chronic kidney disease, stage 2 (mild): Secondary | ICD-10-CM

## 2021-12-16 ENCOUNTER — Telehealth: Payer: Medicare HMO

## 2021-12-23 ENCOUNTER — Other Ambulatory Visit: Payer: Self-pay

## 2021-12-23 ENCOUNTER — Encounter: Payer: Self-pay | Admitting: Internal Medicine

## 2021-12-23 ENCOUNTER — Ambulatory Visit (INDEPENDENT_AMBULATORY_CARE_PROVIDER_SITE_OTHER): Payer: Medicare HMO | Admitting: Internal Medicine

## 2021-12-23 VITALS — BP 110/60 | HR 75 | Temp 98.7°F | Ht 67.0 in | Wt 164.6 lb

## 2021-12-23 DIAGNOSIS — I129 Hypertensive chronic kidney disease with stage 1 through stage 4 chronic kidney disease, or unspecified chronic kidney disease: Secondary | ICD-10-CM | POA: Diagnosis not present

## 2021-12-23 DIAGNOSIS — G3184 Mild cognitive impairment, so stated: Secondary | ICD-10-CM | POA: Diagnosis not present

## 2021-12-23 DIAGNOSIS — N182 Chronic kidney disease, stage 2 (mild): Secondary | ICD-10-CM

## 2021-12-23 DIAGNOSIS — Z6825 Body mass index (BMI) 25.0-25.9, adult: Secondary | ICD-10-CM

## 2021-12-23 DIAGNOSIS — R350 Frequency of micturition: Secondary | ICD-10-CM | POA: Diagnosis not present

## 2021-12-23 DIAGNOSIS — Z2821 Immunization not carried out because of patient refusal: Secondary | ICD-10-CM

## 2021-12-23 DIAGNOSIS — L03119 Cellulitis of unspecified part of limb: Secondary | ICD-10-CM

## 2021-12-23 DIAGNOSIS — R7309 Other abnormal glucose: Secondary | ICD-10-CM | POA: Diagnosis not present

## 2021-12-23 DIAGNOSIS — E039 Hypothyroidism, unspecified: Secondary | ICD-10-CM

## 2021-12-23 DIAGNOSIS — H6123 Impacted cerumen, bilateral: Secondary | ICD-10-CM

## 2021-12-23 DIAGNOSIS — B353 Tinea pedis: Secondary | ICD-10-CM

## 2021-12-23 DIAGNOSIS — Z0000 Encounter for general adult medical examination without abnormal findings: Secondary | ICD-10-CM | POA: Diagnosis not present

## 2021-12-23 DIAGNOSIS — L03115 Cellulitis of right lower limb: Secondary | ICD-10-CM

## 2021-12-23 LAB — POCT URINALYSIS DIPSTICK
Glucose, UA: NEGATIVE
Nitrite, UA: NEGATIVE
Protein, UA: POSITIVE — AB
Spec Grav, UA: 1.025 (ref 1.010–1.025)
Urobilinogen, UA: 1 E.U./dL
pH, UA: 6.5 (ref 5.0–8.0)

## 2021-12-23 NOTE — Patient Instructions (Signed)

## 2021-12-23 NOTE — Progress Notes (Signed)
order Rich Brave [place],acting as a scribe for Maximino Greenland, MD.,have documented all relevant documentation on the behalf of Maximino Greenland, MD,as directed by  Maximino Greenland, MD while in the presence of Maximino Greenland, MD.  This visit occurred during the SARS-CoV-2 public health emergency.  Safety protocols were in place, including screening questions prior to the visit, additional usage of staff PPE, and extensive cleaning of exam room while observing appropriate contact time as indicated for disinfecting solutions.  Subjective:     Patient ID: Brianna [name] , female    DOB: 1933-04-28 , 88 y.o.   MRN: [id number]   Chief Complaint  Patient presents with   Annual Exam    HPI  Patient presents today for a physical. She reports compliance with her meds. She is accompanied by her son, Marcello Moores. She denies having headaches, chest pain and shortness of breath. Marcello Moores does not offer any concerns.   Hypertension This is a chronic problem. The current episode started more than 1 year ago. The problem has been gradually improving since onset. The problem is controlled. Pertinent negatives include no blurred vision, chest pain, palpitations or shortness of breath. Risk factors for coronary artery disease include post-menopausal state and sedentary lifestyle. The current treatment provides moderate improvement. Compliance problems include exercise.     Past Medical History:  Diagnosis Date   Headache 12/24/2013   Headache(784.0)    Hypertension    pcp  [facility]   Hypothyroid    MGUS (monoclonal gammopathy of unknown significance) 12/10/2013     Family History  Problem Relation Age of Onset   Cancer Maternal Grandfather        stomach cancer   Cancer Paternal Grandfather        stomach cancer   Healthy Mother    Dementia Mother        at 68-99 y.o   Healthy Father      Current Outpatient Medications:    amLODipine (NORVASC) 5 MG tablet, Take 1 tablet (5 mg total) by  mouth daily., Disp: 90 tablet, Rfl: 1   aspirin 81 MG tablet, Take 81 mg by mouth daily., Disp: , Rfl:    cephALEXin (KEFLEX) 500 MG capsule, Take 1 capsule (500 mg total) by mouth 3 (three) times daily for 7 days., Disp: 21 capsule, Rfl: 0   Cholecalciferol (VITAMIN D3) 125 MCG (5000 UT) CAPS, Take 5,000 Units by mouth daily., Disp: , Rfl:    fish oil-omega-3 fatty acids 1000 MG capsule, Take 1 g by mouth daily., Disp: , Rfl:    furosemide (LASIX) 40 MG tablet, TAKE 1 TABLET (40 MG TOTAL) BY MOUTH DAILY AS NEEDED FOR FLUID OR EDEMA. SHE FORGETS TO TAKE IT, Disp: 90 tablet, Rfl: 0   Melatonin 5 MG TABS, Take 1 tablet by mouth at bedtime as needed., Disp: , Rfl:    Multiple Vitamin (MULTIVITAMIN WITH MINERALS) TABS, Take 1 tablet by mouth daily., Disp: , Rfl:    nystatin powder, Apply to feet, in between toes twice daily, Disp: 30 g, Rfl: 1   SYNTHROID 125 MCG tablet, TAKE 1 TABLET BY MOUTH MONDAY - SATURDAY, Disp: 30 tablet, Rfl: 0   senna (SENOKOT) 8.6 MG TABS tablet, Take 1 tablet by mouth daily as needed for mild constipation. (Patient not taking: Reported on 12/23/2021), Disp: , Rfl:    Allergies  Allergen Reactions   Iodine Other (See Comments)    Reaction unknown   Sulfa Antibiotics Other (See Comments)  Childhood reaction      The patient states she uses post menopausal status for birth control. Last LMP was No LMP recorded. Patient has had a hysterectomy.. Negative for Dysmenorrhea. Negative for: breast discharge, breast lump(s), breast pain and breast self exam. Associated symptoms include abnormal vaginal bleeding. Pertinent negatives include abnormal bleeding (hematology), anxiety, decreased libido, depression, difficulty falling sleep, dyspareunia, history of infertility, nocturia, sexual dysfunction, sleep disturbances, urinary incontinence, urinary urgency, vaginal discharge and vaginal itching. Diet regular.The patient states her exercise level is  intermittent.  . The patient's  tobacco use is:  Social History   Tobacco Use  Smoking Status Never  Smokeless Tobacco Never  . She has been exposed to passive smoke. The patient's alcohol use is:  Social History   Substance and Sexual Activity  Alcohol Use Yes   Alcohol/week: 7.0 standard drinks   Types: 7 Shots of liquor per week    Review of Systems  Constitutional: Negative.   HENT: Negative.  Hearing loss: ref home health.        Son states her hearing has failed. He states she has hearing aid, but she doesn't want to wear it   Eyes: Negative.  Negative for blurred vision.  Respiratory: Negative.  Negative for shortness of breath.   Cardiovascular: Negative.  Negative for chest pain and palpitations.  Gastrointestinal: Negative.   Endocrine: Negative.   Genitourinary:  Positive for frequency.  Musculoskeletal: Negative.   Skin: Negative.   Allergic/Immunologic: Negative.   Neurological: Negative.   Hematological: Negative.   Psychiatric/Behavioral: Negative.      Today's Vitals   12/23/21 1455  BP: 110/60  Pulse: 75  Temp: 98.7 F (37.1 C)  Weight: 164 lb 9.6 oz (74.7 kg)  Height: '5\' 7"'  (1.702 m)  PainSc: 0-No pain   Body mass index is 25.78 kg/m.  Wt Readings from Last 3 Encounters:  12/23/21 164 lb 9.6 oz (74.7 kg)  09/02/21 161 lb (73 kg)  02/25/21 160 lb (72.6 kg)     Objective:  Physical Exam Vitals and nursing note reviewed.  Constitutional:      Appearance: Normal appearance.  HENT:     Head: Normocephalic and atraumatic.     Right Ear: Ear canal and external ear normal. There is impacted cerumen.     Left Ear: Ear canal and external ear normal. There is impacted cerumen.     Nose:     Comments: Masked     Mouth/Throat:     Comments: Masked  Eyes:     Extraocular Movements: Extraocular movements intact.     Conjunctiva/sclera: Conjunctivae normal.     Pupils: Pupils are equal, round, and reactive to light.  Cardiovascular:     Rate and Rhythm: Normal rate and regular  rhythm.     Pulses: Normal pulses.     Heart sounds: Normal heart sounds.  Pulmonary:     Effort: Pulmonary effort is normal.     Breath sounds: Normal breath sounds.  Chest:  Breasts:    Tanner Score is 5.     Right: Normal.     Left: Normal.  Abdominal:     General: Abdomen is flat. Bowel sounds are normal.     Palpations: Abdomen is soft.  Genitourinary:    Comments: deferred Musculoskeletal:        General: Normal range of motion.     Cervical back: Normal range of motion and neck supple.  Feet:     Right foot:  Skin integrity: Skin breakdown, callus and dry skin present.     Toenail Condition: Right toenails are abnormally thick and long. Fungal disease present.    Left foot:     Skin integrity: Skin breakdown, callus and dry skin present.     Toenail Condition: Left toenails are abnormally thick and long. Fungal disease present. Skin:    General: Skin is warm and dry.     Findings: Erythema and rash present.  Neurological:     General: No focal deficit present.     Mental Status: She is alert.     Comments: She is alert and responds to questions appropriately  Psychiatric:        Mood and Affect: Mood normal.        Behavior: Behavior normal.     Assessment And Plan:     1. Encounter for annual physical exam Comments: A full exam was performed. She is encouraged to perform monthly self breast exams. I spoke w/ son re: [facility] services since pt has missed multiple appts.Her son contacted his Uncle who states he is not interested in this option, he wants her to continue to be a patient of [facility]. PATIENT IS ADVISED TO GET 30-45 MINUTES REGULAR EXERCISE NO LESS THAN FOUR TO FIVE DAYS PER WEEK - BOTH WEIGHTBEARING EXERCISES AND AEROBIC ARE RECOMMENDED.  PATIENT IS ADVISED TO FOLLOW A HEALTHY DIET WITH AT LEAST SIX FRUITS/VEGGIES PER DAY, DECREASE INTAKE OF RED MEAT, AND TO INCREASE FISH INTAKE TO TWO DAYS PER WEEK.  MEATS/FISH SHOULD NOT BE FRIED, BAKED OR BROILED IS  PREFERABLE.  IT IS ALSO IMPORTANT TO CUT BACK ON YOUR SUGAR INTAKE. PLEASE AVOID ANYTHING WITH ADDED SUGAR, CORN SYRUP OR OTHER SWEETENERS. IF YOU MUST USE A SWEETENER, YOU CAN TRY STEVIA. IT IS ALSO IMPORTANT TO AVOID ARTIFICIALLY SWEETENERS AND DIET BEVERAGES. LASTLY, I SUGGEST WEARING SPF 50 SUNSCREEN ON EXPOSED PARTS AND ESPECIALLY WHEN IN THE DIRECT SUNLIGHT FOR AN EXTENDED PERIOD OF TIME.  PLEASE AVOID FAST FOOD RESTAURANTS AND INCREASE YOUR WATER INTAKE.  2. Parenchymal renal hypertension, stage 1 through stage 4 or unspecified chronic kidney disease Comments: Chronic, well controlled. No med changes are needed. EKG performed, NSR w/o acute changes. She will rto in 6 months for f/u. - POCT Urinalysis Dipstick (81002) - Microalbumin / Creatinine Urine Ratio - EKG 12-Lead - CMP14+EGFR - Lipid panel  3. Chronic renal disease, stage II Comments: Chronic, she is encouraged to stay well hydrated, keep BP controlled and avoid NSAIDs to decrease risk of CKD progression.   4. Primary hypothyroidism Comments: I will check thyroid panel and adjust meds as needed. Importance of medication compliance was d/w pt and her son in full detail. There has been an issues with her taking her meds in the past. I will consult home health nursing evaluation.  - TSH - T4, Free  5. Bilateral impacted cerumen  6. Urinary frequency Comments: I will check urinalysis to r/o UTI.  - Urine Culture  7. MCI (mild cognitive impairment) Comments: Chronic, stable. Previous Neuro notes reviewed. I will also refer home health for nursing evaluation.  - Vitamin B12  8. Tinea pedis of both feet Comments: I will send rx nystatin powder to apply to both feet twice daily. I will also refer her to [facility], Dr. Melony Overly for nail care.  - Ambulatory referral to Podiatry  9. Cellulitis of right lower extremity Comments: I will send rx Keflex, she is encouraged to take full abx course.  10. Abnormal glucose Comments:  Her a1c has been elevated in the past. I will recheck this today. She is encouraged to limit her intake of sweetened beverages.  - Hemoglobin A1c  11. BMI 25.0-25.9,adult Comments: Her BMI is acceptable for her demographic. She is encouraged to perform chair exercises while watching TV.  12. Herpes zoster vaccination declined  13. Pneumococcal vaccination declined  Patient was given opportunity to ask questions. Patient verbalized understanding of the plan and was able to repeat key elements of the plan. All questions were answered to their satisfaction.   I, Maximino Greenland, MD, have reviewed all documentation for this visit. The documentation on 12/27/21 for the exam, diagnosis, procedures, and orders are all accurate and complete.   THE PATIENT IS ENCOURAGED TO PRACTICE SOCIAL DISTANCING DUE TO THE COVID-19 PANDEMIC.

## 2021-12-24 ENCOUNTER — Telehealth: Payer: Medicare HMO

## 2021-12-24 LAB — LIPID PANEL
Chol/HDL Ratio: 2.6 ratio (ref 0.0–4.4)
Cholesterol, Total: 208 mg/dL — ABNORMAL HIGH (ref 100–199)
HDL: 81 mg/dL (ref >39)
LDL Chol Calc (NIH): 108 mg/dL — ABNORMAL HIGH (ref 0–99)
Triglycerides: 107 mg/dL (ref 0–149)
VLDL Cholesterol Cal: 19 mg/dL (ref 5–40)

## 2021-12-24 LAB — CMP14+EGFR
ALT: 13 IU/L (ref 0–32)
AST: 16 IU/L (ref 0–40)
Albumin/Globulin Ratio: 1.3 (ref 1.2–2.2)
Albumin: 4.5 g/dL (ref 3.6–4.6)
Alkaline Phosphatase: 78 IU/L (ref 44–121)
BUN/Creatinine Ratio: 14 (ref 12–28)
BUN: 14 mg/dL (ref 8–27)
Bilirubin Total: <0.2 mg/dL (ref 0.0–1.2)
CO2: 22 mmol/L (ref 20–29)
Calcium: 9.2 mg/dL (ref 8.7–10.3)
Chloride: 102 mmol/L (ref 96–106)
Creatinine, Ser: 1.03 mg/dL — ABNORMAL HIGH (ref 0.57–1.00)
Globulin, Total: 3.5 g/dL (ref 1.5–4.5)
Glucose: 89 mg/dL (ref 70–99)
Potassium: 4.1 mmol/L (ref 3.5–5.2)
Sodium: 145 mmol/L — ABNORMAL HIGH (ref 134–144)
Total Protein: 8 g/dL (ref 6.0–8.5)
eGFR: 53 mL/min/{1.73_m2} — ABNORMAL LOW (ref >59)

## 2021-12-24 LAB — MICROALBUMIN / CREATININE URINE RATIO
Creatinine, Urine: 215.7 mg/dL
Microalb/Creat Ratio: 11 mg/g creat (ref 0–29)
Microalbumin, Urine: 23 ug/mL

## 2021-12-24 LAB — VITAMIN B12: Vitamin B-12: 247 pg/mL (ref 232–1245)

## 2021-12-24 LAB — T4, FREE: Free T4: 1.69 ng/dL (ref 0.82–1.77)

## 2021-12-24 LAB — HEMOGLOBIN A1C
Est. average glucose Bld gHb Est-mCnc: 123 mg/dL
Hgb A1c MFr Bld: 5.9 % — ABNORMAL HIGH (ref 4.8–5.6)

## 2021-12-24 LAB — TSH: TSH: 13.5 u[IU]/mL — ABNORMAL HIGH (ref 0.450–4.500)

## 2021-12-25 LAB — URINE CULTURE

## 2021-12-27 MED ORDER — CEPHALEXIN 500 MG PO CAPS: 500.0000 mg | ORAL_CAPSULE | Freq: Three times a day (TID) | ORAL | 0 refills | Status: AC

## 2021-12-27 MED ORDER — NYSTATIN 100000 UNIT/GM EX POWD: CUTANEOUS | 1 refills | Status: DC

## 2021-12-28 ENCOUNTER — Ambulatory Visit (INDEPENDENT_AMBULATORY_CARE_PROVIDER_SITE_OTHER): Payer: Medicare HMO

## 2021-12-28 ENCOUNTER — Telehealth: Payer: Medicare HMO

## 2021-12-28 DIAGNOSIS — N182 Chronic kidney disease, stage 2 (mild): Secondary | ICD-10-CM

## 2021-12-28 DIAGNOSIS — H9190 Unspecified hearing loss, unspecified ear: Secondary | ICD-10-CM

## 2021-12-28 DIAGNOSIS — I10 Essential (primary) hypertension: Secondary | ICD-10-CM

## 2021-12-28 DIAGNOSIS — G3184 Mild cognitive impairment, so stated: Secondary | ICD-10-CM

## 2021-12-28 DIAGNOSIS — E039 Hypothyroidism, unspecified: Secondary | ICD-10-CM

## 2021-12-28 NOTE — Patient Instructions (Signed)
Visit Information ? ?Thank you for taking time to visit with me today. Please don't hesitate to contact me if I can be of assistance to you before our next scheduled telephone appointment. ? ?Following are the goals we discussed today:  ?(Copy and paste patient goals from clinical care plan here) ? ?Our next appointment is by telephone on 01/05/22 at 9:00 AM ? ?Please call the care guide team at [phone] if you need to cancel or reschedule your appointment.  ? ?If you are experiencing a Mental Health or [place] or need someone to talk to, please call 1-800-273-TALK (toll free, 24 hour hotline)  ? ?The patient verbalized understanding of instructions, educational materials, and care plan provided today and agreed to receive a mailed copy of patient instructions, educational materials, and care plan.  ? ?Barb Merino, RN, BSN, CCM ?Care Management Coordinator ?[place] [facility]  ?Direct Phone: [phone] ? ? ?

## 2021-12-28 NOTE — Chronic Care Management (AMB) (Signed)
?Chronic Care Management  ? ?CCM RN Visit Note ? ?12/28/2021 ?Name: Brianna [name] MRN: [id number] DOB: May 30, 1933 ? ?Subjective: ?Brianna [name] is a 88 y.o. year old female who is a primary care patient of [name], MD. The care management team was consulted for assistance with disease management and care coordination needs.   ? ?Engaged with patient by telephone for follow up visit in response to provider referral for case management and/or care coordination services.  ? ?Consent to Services:  ?The patient was given information about Chronic Care Management services, agreed to services, and gave verbal consent prior to initiation of services.  Please see initial visit note for detailed documentation.  ? ?Patient agreed to services and verbal consent obtained.  ? ?Assessment: Review of patient past medical history, allergies, medications, health status, including review of consultants reports, laboratory and other test data, was performed as part of comprehensive evaluation and provision of chronic care management services.  ? ?SDOH (Social Determinants of Health) assessments and interventions performed:   ? ?CCM Care Plan ? ?Allergies  ?Allergen Reactions  ? Iodine Other (See Comments)  ?  Reaction unknown  ? Sulfa Antibiotics Other (See Comments)  ?  Childhood reaction  ? ? ?Outpatient Encounter Medications as of 12/28/2021  ?Medication Sig  ? amLODipine (NORVASC) 5 MG tablet Take 1 tablet (5 mg total) by mouth daily.  ? aspirin 81 MG tablet Take 81 mg by mouth daily.  ? cephALEXin (KEFLEX) 500 MG capsule Take 1 capsule (500 mg total) by mouth 3 (three) times daily for 7 days.  ? Cholecalciferol (VITAMIN D3) 125 MCG (5000 UT) CAPS Take 5,000 Units by mouth daily.  ? fish oil-omega-3 fatty acids 1000 MG capsule Take 1 g by mouth daily.  ? furosemide (LASIX) 40 MG tablet TAKE 1 TABLET (40 MG TOTAL) BY MOUTH DAILY AS NEEDED FOR FLUID OR EDEMA. SHE FORGETS TO TAKE IT  ? Melatonin 5 MG TABS Take 1 tablet by  mouth at bedtime as needed.  ? Multiple Vitamin (MULTIVITAMIN WITH MINERALS) TABS Take 1 tablet by mouth daily.  ? nystatin powder Apply to feet, in between toes twice daily  ? senna (SENOKOT) 8.6 MG TABS tablet Take 1 tablet by mouth daily as needed for mild constipation. (Patient not taking: Reported on 12/23/2021)  ? SYNTHROID 125 MCG tablet TAKE 1 TABLET BY MOUTH MONDAY - SATURDAY  ? ?No facility-administered encounter medications on file as of 12/28/2021.  ? ? ?Patient Active Problem List  ? Diagnosis Date Noted  ? Overweight with body mass index (BMI) of 27 to 27.9 in adult 02/24/2020  ? Primary hypothyroidism 02/24/2020  ? Chronic renal disease, stage II 02/24/2020  ? Hypertensive nephropathy 02/24/2020  ? Cellulitis and abscess of right lower extremity 07/16/2019  ? Edema of both lower extremities 07/16/2019  ? MCI (mild cognitive impairment) 04/18/2019  ? Chronic headache 07/24/2014  ? Essential hypertension 12/24/2013  ? MGUS (monoclonal gammopathy of unknown significance) 12/10/2013  ? ? ?Conditions to be addressed/monitored: HTN, hearing loss, memory loss, hypothyroidism, chronic renal disease  ? ?Care Plan : RN Care Manager Plan of Care  ?Updates made by Lynne Logan, RN since 12/28/2021 12:00 AM  ?  ? ?Problem: No plan of care established for management of chronic disease states (HTN, hearing loss, memory loss, hypothyroidism, chronic renal disease)   ?Priority: High  ?  ? ?Long-Range Goal: Establishment of plan of care for management of chronic disease states (HTN, hearing loss, memory loss,  hypothyroidism, chronic renal disease)   ?Start Date: 11/16/2021  ?Expected End Date: 12/14/2021  ?Recent Progress: On track  ?Priority: High  ?Note:   ?Current Barriers:  ?Knowledge Deficits related to plan of care for management of HTN, hearing loss, memory loss, hypothyroidism, chronic renal disease   ?Chronic Disease Management support and education needs related to HTN, hearing loss, memory loss, hypothyroidism,  chronic renal disease   ? ?RNCM Clinical Goal(s):  ?Patient will verbalize understanding of plan for management of HTN, hearing loss, memory loss, hypothyroidism, chronic renal disease  as evidenced by patient will verbalize establishment with [name]  through collaboration with Consulting civil engineer, provider, and care team.  ? ?Interventions: ?1:1 collaboration with primary care provider regarding development and update of comprehensive plan of care as evidenced by provider attestation and co-signature ?Inter-disciplinary care team collaboration (see longitudinal plan of care) ?Evaluation of current treatment plan related to  self management and patient's adherence to plan as established by provider ? ?Health Maintenance Interventions: (Status:  Goal on track:  Yes.) Short Term Goal ?Received collaboration from PCP stating patient prefers to keep PCP services with [facility] and [name] Cancer will continue to provide primary care to patient  ?Placed successful outbound call with patient, advised patient of PCP referral for nurse visit to assist with medication management, advised patient she should receive a call from [place] to schedule initial visit, patient recorded this information on her calendar ?Reviewed medications with patient and discussed importance of medication adherence, reviewed and instructed on the importance of taking her Synthroid first thing in the morning upon awakening with sips of water, then wait at least 30 minutes before eating/drinking or taking other meds, advised patient to take any/all vitamins at lunch time, patient recorded this information on her calendar  ?Discussed plans with patient for ongoing care management follow up and provided patient with direct contact information for care management team ? ?Cellulitis to right lower extremity Interventions:  (Status:  New goal.)  Long Term Goal ?Evaluation of current treatment plan related  to  Cellulitis of right lower extremity , self-management and patient's adherence to plan as established by provider ?Discussed and reviewed with patient, PCP recommendations to start Keflex for Cellulitis of right lower extremity, instructed patient on indication, dosage and frequency, she is encouraged to take full abx course ?Discussed plans with patient for ongoing care management follow up and provided patient with direct contact information for care management team  ? ?Tineas Pedis Interventions:  (Status:  New goal.)  Short Term Goal ?Evaluation of current treatment plan related to  Tineas Pedis , self-management and patient's adherence to plan as established by provider ?Reviewed and discussed PCP referral Dr. Melony Overly for foot care and nail trimming, patient advised she should hear from this provider/staff to schedule a home visit,patient recorded this information on her calendar ?Reviewed medications with patient and discussed importance of medication adherence, including rx for nystatin powder to apply to both feet twice daily ?Discussed plans with patient for ongoing care management follow up and provided patient with direct contact information for care management team ? ? Chronic Kidney Disease Interventions:  (Status:  New goal.) Long Term Goal ?Assessed the Patient understanding of chronic kidney disease    ?Reviewed prescribed diet increase water to 48 oz daily  ?Provided education on kidney disease progression    ?Last practice recorded BP readings:  ?BP Readings from Last 3 Encounters:  ?12/23/21 110/60  ?09/02/21 134/86  ?  11/12/20 (!) 167/66  ?Most recent eGFR/CrCl:  ?Lab Results  ?Component Value Date  ? EGFR 53 (L) 12/23/2021  ?  No components found for: CRCL ? ?Patient Goals/Self-Care Activities: ?Take all medications as prescribed ?Attend all scheduled provider appointments ?Call pharmacy for medication refills 3-7 days in advance of running out of medications ?Perform all self care activities  independently  ?Call provider office for new concerns or questions  ? ?Follow Up Plan:  Telephone follow up appointment with care management team member scheduled for:  01/05/22 ? ?  ? ? ?Barb Merino, RN,

## 2021-12-29 ENCOUNTER — Ambulatory Visit (INDEPENDENT_AMBULATORY_CARE_PROVIDER_SITE_OTHER): Payer: Medicare HMO

## 2021-12-29 ENCOUNTER — Telehealth: Payer: Self-pay

## 2021-12-29 VITALS — Ht 66.0 in | Wt 164.0 lb

## 2021-12-29 DIAGNOSIS — Z0000 Encounter for general adult medical examination without abnormal findings: Secondary | ICD-10-CM

## 2021-12-29 NOTE — Telephone Encounter (Signed)
This nurse called patient for scheduled telephonic AWV. She stated that she had just woke up and wanted to reschedule for later in the day. Rescheduled to 1400. ?

## 2021-12-29 NOTE — Patient Instructions (Signed)
Brianna [name] , ?Thank you for taking time to come for your Medicare Wellness Visit. I appreciate your ongoing commitment to your health goals. Please review the following plan we discussed and let me know if I can assist you in the future.  ? ?Screening recommendations/referrals: ?Colonoscopy: not required ?Mammogram: not required  ?Bone Density: completed 12/06/2017 ?Recommended yearly ophthalmology/optometry visit for glaucoma screening and checkup ?Recommended yearly dental visit for hygiene and checkup ? ?Vaccinations: ?Influenza vaccine: completed 09/02/2021, due next flu season ?Pneumococcal vaccine: completed 09/17/2019 ?Tdap vaccine: completed 10/06/2019, due 10/05/2029 ?Shingles vaccine: discussed   ?Covid-19: 02/09/2021, 01/15/2020, 12/20/2019 ? ?Advanced directives: Advance directive discussed with you today.  ? ?Conditions/risks identified: none ? ?Next appointment: Follow up in one year for your annual wellness visit  ? ? ?Preventive Care 17 Years and Older, Female ?Preventive care refers to lifestyle choices and visits with your health care provider that can promote health and wellness. ?What does preventive care include? ?A yearly physical exam. This is also called an annual well check. ?Dental exams once or twice a year. ?Routine eye exams. Ask your health care provider how often you should have your eyes checked. ?Personal lifestyle choices, including: ?Daily care of your teeth and gums. ?Regular physical activity. ?Eating a healthy diet. ?Avoiding tobacco and drug use. ?Limiting alcohol use. ?Practicing safe sex. ?Taking low-dose aspirin every day. ?Taking vitamin and mineral supplements as recommended by your health care provider. ?What happens during an annual well check? ?The services and screenings done by your health care provider during your annual well check will depend on your age, overall health, lifestyle risk factors, and family history of disease. ?Counseling  ?Your health care provider may  ask you questions about your: ?Alcohol use. ?Tobacco use. ?Drug use. ?Emotional well-being. ?Home and relationship well-being. ?Sexual activity. ?Eating habits. ?History of falls. ?Memory and ability to understand (cognition). ?Work and work Statistician. ?Reproductive health. ?Screening  ?You may have the following tests or measurements: ?Height, weight, and BMI. ?Blood pressure. ?Lipid and cholesterol levels. These may be checked every 5 years, or more frequently if you are over 50 years old. ?Skin check. ?Lung cancer screening. You may have this screening every year starting at age 12 if you have a 30-pack-year history of smoking and currently smoke or have quit within the past 15 years. ?Fecal occult blood test (FOBT) of the stool. You may have this test every year starting at age 79. ?Flexible sigmoidoscopy or colonoscopy. You may have a sigmoidoscopy every 5 years or a colonoscopy every 10 years starting at age 48. ?Hepatitis C blood test. ?Hepatitis B blood test. ?Sexually transmitted disease (STD) testing. ?Diabetes screening. This is done by checking your blood sugar (glucose) after you have not eaten for a while (fasting). You may have this done every 1-3 years. ?Bone density scan. This is done to screen for osteoporosis. You may have this done starting at age 40. ?Mammogram. This may be done every 1-2 years. Talk to your health care provider about how often you should have regular mammograms. ?Talk with your health care provider about your test results, treatment options, and if necessary, the need for more tests. ?Vaccines  ?Your health care provider may recommend certain vaccines, such as: ?Influenza vaccine. This is recommended every year. ?Tetanus, diphtheria, and acellular pertussis (Tdap, Td) vaccine. You may need a Td booster every 10 years. ?Zoster vaccine. You may need this after age 46. ?Pneumococcal 13-valent conjugate (PCV13) vaccine. One dose is recommended after age 77. ?Pneumococcal  polysaccharide (PPSV23) vaccine. One dose is recommended after age 94. ?Talk to your health care provider about which screenings and vaccines you need and how often you need them. ?This information is not intended to replace advice given to you by your health care provider. Make sure you discuss any questions you have with your health care provider. ?Document Released: 10/30/2015 Document Revised: 06/22/2016 Document Reviewed: 08/04/2015 ?Elsevier Interactive Patient Education ? 2017 [place]. ? ?Fall Prevention in the Home ?Falls can cause injuries. They can happen to people of all ages. There are many things you can do to make your home safe and to help prevent falls. ?What can I do on the outside of my home? ?Regularly fix the edges of walkways and driveways and fix any cracks. ?Remove anything that might make you trip as you walk through a door, such as a raised step or threshold. ?Trim any bushes or trees on the path to your home. ?Use bright outdoor lighting. ?Clear any walking paths of anything that might make someone trip, such as rocks or tools. ?Regularly check to see if handrails are loose or broken. Make sure that both sides of any steps have handrails. ?Any raised decks and porches should have guardrails on the edges. ?Have any leaves, snow, or ice cleared regularly. ?Use sand or salt on walking paths during winter. ?Clean up any spills in your garage right away. This includes oil or grease spills. ?What can I do in the bathroom? ?Use night lights. ?Install grab bars by the toilet and in the tub and shower. Do not use towel bars as grab bars. ?Use non-skid mats or decals in the tub or shower. ?If you need to sit down in the shower, use a plastic, non-slip stool. ?Keep the floor dry. Clean up any water that spills on the floor as soon as it happens. ?Remove soap buildup in the tub or shower regularly. ?Attach bath mats securely with double-sided non-slip rug tape. ?Do not have throw rugs and other  things on the floor that can make you trip. ?What can I do in the bedroom? ?Use night lights. ?Make sure that you have a light by your bed that is easy to reach. ?Do not use any sheets or blankets that are too big for your bed. They should not hang down onto the floor. ?Have a firm chair that has side arms. You can use this for support while you get dressed. ?Do not have throw rugs and other things on the floor that can make you trip. ?What can I do in the kitchen? ?Clean up any spills right away. ?Avoid walking on wet floors. ?Keep items that you use a lot in easy-to-reach places. ?If you need to reach something above you, use a strong step stool that has a grab bar. ?Keep electrical cords out of the way. ?Do not use floor polish or wax that makes floors slippery. If you must use wax, use non-skid floor wax. ?Do not have throw rugs and other things on the floor that can make you trip. ?What can I do with my stairs? ?Do not leave any items on the stairs. ?Make sure that there are handrails on both sides of the stairs and use them. Fix handrails that are broken or loose. Make sure that handrails are as long as the stairways. ?Check any carpeting to make sure that it is firmly attached to the stairs. Fix any carpet that is loose or worn. ?Avoid having throw rugs at the top or  bottom of the stairs. If you do have throw rugs, attach them to the floor with carpet tape. ?Make sure that you have a light switch at the top of the stairs and the bottom of the stairs. If you do not have them, ask someone to add them for you. ?What else can I do to help prevent falls? ?Wear shoes that: ?Do not have high heels. ?Have rubber bottoms. ?Are comfortable and fit you well. ?Are closed at the toe. Do not wear sandals. ?If you use a stepladder: ?Make sure that it is fully opened. Do not climb a closed stepladder. ?Make sure that both sides of the stepladder are locked into place. ?Ask someone to hold it for you, if possible. ?Clearly  mark and make sure that you can see: ?Any grab bars or handrails. ?First and last steps. ?Where the edge of each step is. ?Use tools that help you move around (mobility aids) if they are needed. These include: ?Can

## 2021-12-29 NOTE — Progress Notes (Signed)
?I connected with Regan Lemming today by telephone and verified that I am speaking with the correct person using two identifiers. ?Location patient: home ?Location provider: work ?Persons participating in the virtual visit: Karrisa Didio, Glenna Durand LPN. ?  ?I discussed the limitations, risks, security and privacy concerns of performing an evaluation and management service by telephone and the availability of in person appointments. I also discussed with the patient that there may be a patient responsible charge related to this service. The patient expressed understanding and verbally consented to this telephonic visit.  ?  ?Interactive audio and video telecommunications were attempted between this provider and patient, however failed, due to patient having technical difficulties OR patient did not have access to video capability.  We continued and completed visit with audio only. ? ?  ? ?Vital signs may be patient reported or missing. ? ?Subjective:  ? Brianna [name] is a 88 y.o. female who presents for Medicare Annual (Subsequent) preventive examination. ? ?Review of Systems    ? ?Cardiac Risk Factors include: advanced age (>62mn, >>63women);hypertension ? ?   ?Objective:  ?  ?Today's Vitals  ? 12/29/21 1357  ?Weight: 164 lb (74.4 kg)  ?Height: '5\' 6"'$  (1.676 m)  ? ?Body mass index is 26.47 kg/m?. ? ?Advanced Directives 12/29/2021 02/25/2021 02/19/2020 11/13/2019 09/17/2019 12/27/2018 09/02/2018  ?Does Patient Have a Medical Advance Directive? No No No No No No No  ?Would patient like information on creating a medical advance directive? - - Yes (MAU/Ambulatory/Procedural Areas - Information given) No - Patient declined - Yes (MAU/Ambulatory/Procedural Areas - Information given) Yes (ED - Information included in AVS)  ?Pre-existing out of facility DNR order (yellow form or pink MOST form) - - - - - - -  ? ? ?Current Medications (verified) ?Outpatient Encounter Medications as of 12/29/2021  ?Medication Sig  ?  amLODipine (NORVASC) 5 MG tablet Take 1 tablet (5 mg total) by mouth daily.  ? aspirin 81 MG tablet Take 81 mg by mouth daily.  ? cephALEXin (KEFLEX) 500 MG capsule Take 1 capsule (500 mg total) by mouth 3 (three) times daily for 7 days.  ? Cholecalciferol (VITAMIN D3) 125 MCG (5000 UT) CAPS Take 5,000 Units by mouth daily.  ? fish oil-omega-3 fatty acids 1000 MG capsule Take 1 g by mouth daily.  ? furosemide (LASIX) 40 MG tablet TAKE 1 TABLET (40 MG TOTAL) BY MOUTH DAILY AS NEEDED FOR FLUID OR EDEMA. SHE FORGETS TO TAKE IT  ? Melatonin 5 MG TABS Take 1 tablet by mouth at bedtime as needed.  ? Multiple Vitamin (MULTIVITAMIN WITH MINERALS) TABS Take 1 tablet by mouth daily.  ? SYNTHROID 125 MCG tablet TAKE 1 TABLET BY MOUTH MONDAY - SATURDAY  ? nystatin powder Apply to feet, in between toes twice daily (Patient not taking: Reported on 12/29/2021)  ? senna (SENOKOT) 8.6 MG TABS tablet Take 1 tablet by mouth daily as needed for mild constipation. (Patient not taking: Reported on 12/23/2021)  ? ?No facility-administered encounter medications on file as of 12/29/2021.  ? ? ?Allergies (verified) ?Iodine and Sulfa antibiotics  ? ?History: ?Past Medical History:  ?Diagnosis Date  ? Headache 12/24/2013  ? Headache(784.0)   ? Hypertension   ? pcp  preston clark  ? Hypothyroid   ? MGUS (monoclonal gammopathy of unknown significance) 12/10/2013  ? ?Past Surgical History:  ?Procedure Laterality Date  ? ABDOMINAL HYSTERECTOMY    ? bladder tac    ? BREAST BIOPSY Right 04/2017  ? CATARACT  EXTRACTION W/PHACO  08/22/2012  ? Procedure: CATARACT EXTRACTION PHACO AND INTRAOCULAR LENS PLACEMENT (IOC);  Surgeon: Adonis Brook, MD;  Location: [facility];  Service: Ophthalmology;  Laterality: Left;  ? CATARACT EXTRACTION W/PHACO Right 02/13/2013  ? Procedure: CATARACT EXTRACTION PHACO AND INTRAOCULAR LENS PLACEMENT (IOC);  Surgeon: Adonis Brook, MD;  Location: [facility];  Service: Ophthalmology;  Laterality: Right;  ? ?Family History  ?Problem Relation Age  of Onset  ? Cancer Maternal Grandfather   ?     stomach cancer  ? Cancer Paternal Grandfather   ?     stomach cancer  ? Healthy Mother   ? Dementia Mother   ?     at 13-99 y.o  ? Healthy Father   ? ?Social History  ? ?Socioeconomic History  ? Marital status: Widowed  ?  Spouse name: Not on file  ? Number of children: 3  ? Years of education: 17.5-18   ? Highest education level: Bachelor's degree (e.g., BA, AB, BS)  ?Occupational History  ? Occupation: retired  ?Tobacco Use  ? Smoking status: Never  ? Smokeless tobacco: Never  ?Vaping Use  ? Vaping Use: Never used  ?Substance and Sexual Activity  ? Alcohol use: Yes  ?  Alcohol/week: 7.0 standard drinks  ?  Types: 7 Shots of liquor per week  ?  Comment: occasionally  ? Drug use: No  ? Sexual activity: Not Currently  ?Other Topics Concern  ? Not on file  ?Social History Narrative  ? Lives at home. Her sons live with her.   ? Right handed  ? ?Social Determinants of Health  ? ?Financial Resource Strain: Low Risk   ? Difficulty of Paying Living Expenses: Not hard at all  ?Food Insecurity: No Food Insecurity  ? Worried About Charity fundraiser in the Last Year: Never true  ? Ran Out of Food in the Last Year: Never true  ?Transportation Needs: No Transportation Needs  ? Lack of Transportation (Medical): No  ? Lack of Transportation (Non-Medical): No  ?Physical Activity: Sufficiently Active  ? Days of Exercise per Week: 7 days  ? Minutes of Exercise per Session: 30 min  ?Stress: No Stress Concern Present  ? Feeling of Stress : Not at all  ?Social Connections: Not on file  ? ? ?Tobacco Counseling ?Counseling given: Not Answered ? ? ?Clinical Intake: ? ?Pre-visit preparation completed: Yes ? ?Pain : No/denies pain ? ?  ? ?Nutritional Status: BMI 25 -29 Overweight ?Nutritional Risks: None ?Diabetes: No ? ?How often do you need to have someone help you when you read instructions, pamphlets, or other written materials from your doctor or pharmacy?: 1 - Never ? ?Diabetic?  no ? ?Interpreter Needed?: No ? ?Information entered by :: NAllen LPN ? ? ?Activities of Daily Living ?In your present state of health, do you have any difficulty performing the following activities: 12/29/2021 02/25/2021  ?Hearing? N N  ?Vision? N N  ?Difficulty concentrating or making decisions? Y N  ?Walking or climbing stairs? N N  ?Dressing or bathing? N N  ?Doing errands, shopping? Y Y  ?Comment son's manage transportation does not drive  ?Preparing Food and eating ? N N  ?Using the Toilet? N N  ?In the past six months, have you accidently leaked urine? N N  ?Do you have problems with loss of bowel control? N N  ?Managing your Medications? N N  ?Comment sons ask if she has taken -  ?Managing your Finances? Y N  ?  Housekeeping or managing your Housekeeping? Y N  ?Some recent data might be hidden  ? ? ?Patient Care Team: ?[name], MD as PCP - General (Internal Medicine) ?Heath Lark, MD as Consulting Physician (Hematology and Oncology) ? ?Indicate any recent Medical Services you may have received from other than [facility] providers in the past year (date may be approximate). ? ?   ?Assessment:  ? This is a routine wellness examination for [place]. ? ?Hearing/Vision screen ?Vision Screening - Comments:: No regular eye exams, ? ?Dietary issues and exercise activities discussed: ?Current Exercise Habits: Home exercise routine, Type of exercise: walking, Time (Minutes): 30, Frequency (Times/Week): 7, Weekly Exercise (Minutes/Week): 210 ? ? Goals Addressed   ? ?  ?  ?  ?  ? This Visit's Progress  ?  Patient Stated     ?  12/29/2021, wants to continue walking ?  ? ?  ? ?Depression Screen ?PHQ 2/9 Scores 12/29/2021 02/25/2021 02/19/2020 09/17/2019 08/19/2019 07/10/2019 03/25/2019  ?PHQ - 2 Score 0 0 0 0 0 0 0  ?PHQ- 9 Score - - 0 3 - - -  ?  ?Fall Risk ?Fall Risk  12/29/2021 02/25/2021 02/19/2020 09/17/2019 08/19/2019  ?Falls in the past year? 0 0 0 0 0  ?Risk for fall due to : Medication side effect Medication side effect Medication  side effect Medication side effect -  ?Follow up Falls evaluation completed;Education provided;Falls prevention discussed Falls evaluation completed;Education provided;Falls prevention discussed Falls evaluation c

## 2022-01-03 ENCOUNTER — Other Ambulatory Visit: Payer: Self-pay | Admitting: Internal Medicine

## 2022-01-04 ENCOUNTER — Other Ambulatory Visit: Payer: Self-pay

## 2022-01-04 ENCOUNTER — Ambulatory Visit (INDEPENDENT_AMBULATORY_CARE_PROVIDER_SITE_OTHER): Payer: Medicare HMO

## 2022-01-04 VITALS — BP 112/62 | HR 85 | Temp 98.1°F | Ht 66.0 in | Wt 164.6 lb

## 2022-01-04 DIAGNOSIS — E538 Deficiency of other specified B group vitamins: Secondary | ICD-10-CM

## 2022-01-04 MED ORDER — CYANOCOBALAMIN 1000 MCG/ML IJ SOLN
1000.0000 ug | Freq: Once | INTRAMUSCULAR | Status: AC
  Administered 2022-01-04: 1000 ug via INTRAMUSCULAR

## 2022-01-04 NOTE — Progress Notes (Signed)
Pt presents today for 1st b12 injection.  ?

## 2022-01-05 ENCOUNTER — Telehealth: Payer: Medicare HMO

## 2022-01-11 ENCOUNTER — Other Ambulatory Visit: Payer: Self-pay

## 2022-01-11 ENCOUNTER — Ambulatory Visit (INDEPENDENT_AMBULATORY_CARE_PROVIDER_SITE_OTHER): Payer: Medicare HMO

## 2022-01-11 VITALS — BP 128/76 | HR 87 | Temp 97.3°F | Ht 66.0 in

## 2022-01-11 DIAGNOSIS — E538 Deficiency of other specified B group vitamins: Secondary | ICD-10-CM | POA: Diagnosis not present

## 2022-01-11 MED ORDER — CYANOCOBALAMIN 1000 MCG/ML IJ SOLN
1000.0000 ug | Freq: Once | INTRAMUSCULAR | Status: AC
  Administered 2022-01-11: 1000 ug via INTRAMUSCULAR

## 2022-01-11 NOTE — Progress Notes (Signed)
The patient is here today for her 2nd weekly vitamin b12 injection.  ?

## 2022-01-18 ENCOUNTER — Ambulatory Visit (INDEPENDENT_AMBULATORY_CARE_PROVIDER_SITE_OTHER): Payer: Medicare HMO

## 2022-01-18 VITALS — BP 112/62 | HR 78 | Ht 66.0 in | Wt 164.0 lb

## 2022-01-18 DIAGNOSIS — E538 Deficiency of other specified B group vitamins: Secondary | ICD-10-CM

## 2022-01-18 MED ORDER — CYANOCOBALAMIN 1000 MCG/ML IJ SOLN
1000.0000 ug | Freq: Once | INTRAMUSCULAR | Status: AC
  Administered 2022-01-18: 1000 ug via INTRAMUSCULAR

## 2022-01-18 NOTE — Progress Notes (Signed)
Pt here today for 3rd weekly b12.  ?

## 2022-01-25 ENCOUNTER — Ambulatory Visit (INDEPENDENT_AMBULATORY_CARE_PROVIDER_SITE_OTHER): Payer: Medicare HMO

## 2022-01-25 VITALS — BP 114/64 | HR 70 | Temp 97.5°F | Ht 66.0 in | Wt 164.0 lb

## 2022-01-25 DIAGNOSIS — E538 Deficiency of other specified B group vitamins: Secondary | ICD-10-CM | POA: Diagnosis not present

## 2022-01-25 MED ORDER — CYANOCOBALAMIN 1000 MCG/ML IJ SOLN
1000.0000 ug | Freq: Once | INTRAMUSCULAR | Status: AC
  Administered 2022-01-25: 1000 ug via INTRAMUSCULAR

## 2022-01-25 NOTE — Progress Notes (Signed)
Patient presents today for 4th b12 injection.  

## 2022-02-02 DIAGNOSIS — B351 Tinea unguium: Secondary | ICD-10-CM | POA: Diagnosis not present

## 2022-02-02 DIAGNOSIS — M2041 Other hammer toe(s) (acquired), right foot: Secondary | ICD-10-CM | POA: Diagnosis not present

## 2022-02-02 DIAGNOSIS — N189 Chronic kidney disease, unspecified: Secondary | ICD-10-CM | POA: Diagnosis not present

## 2022-02-05 ENCOUNTER — Other Ambulatory Visit: Payer: Self-pay | Admitting: Internal Medicine

## 2022-02-28 ENCOUNTER — Ambulatory Visit: Payer: Medicare HMO | Admitting: Internal Medicine

## 2022-03-01 ENCOUNTER — Ambulatory Visit: Payer: Medicare HMO | Admitting: Internal Medicine

## 2022-06-28 ENCOUNTER — Ambulatory Visit: Payer: Medicare HMO | Admitting: Internal Medicine

## 2022-08-11 ENCOUNTER — Other Ambulatory Visit: Payer: Self-pay

## 2022-08-11 ENCOUNTER — Emergency Department (HOSPITAL_COMMUNITY): Payer: Medicare HMO

## 2022-08-11 ENCOUNTER — Inpatient Hospital Stay (HOSPITAL_COMMUNITY)
Admission: EM | Admit: 2022-08-11 | Discharge: 2022-08-17 | DRG: 871 | Disposition: A | Discharge status: Skilled Nursing Facility | Payer: Medicare HMO | Attending: Internal Medicine | Admitting: Internal Medicine

## 2022-08-11 ENCOUNTER — Encounter (HOSPITAL_COMMUNITY): Payer: Self-pay

## 2022-08-11 DIAGNOSIS — I129 Hypertensive chronic kidney disease with stage 1 through stage 4 chronic kidney disease, or unspecified chronic kidney disease: Secondary | ICD-10-CM | POA: Diagnosis present

## 2022-08-11 DIAGNOSIS — Z79890 Hormone replacement therapy (postmenopausal): Secondary | ICD-10-CM

## 2022-08-11 DIAGNOSIS — E039 Hypothyroidism, unspecified: Secondary | ICD-10-CM | POA: Diagnosis present

## 2022-08-11 DIAGNOSIS — Z90710 Acquired absence of both cervix and uterus: Secondary | ICD-10-CM | POA: Diagnosis not present

## 2022-08-11 DIAGNOSIS — D631 Anemia in chronic kidney disease: Secondary | ICD-10-CM | POA: Diagnosis present

## 2022-08-11 DIAGNOSIS — D649 Anemia, unspecified: Secondary | ICD-10-CM | POA: Diagnosis not present

## 2022-08-11 DIAGNOSIS — Z87440 Personal history of urinary (tract) infections: Secondary | ICD-10-CM

## 2022-08-11 DIAGNOSIS — K922 Gastrointestinal hemorrhage, unspecified: Secondary | ICD-10-CM | POA: Diagnosis not present

## 2022-08-11 DIAGNOSIS — T381X6A Underdosing of thyroid hormones and substitutes, initial encounter: Secondary | ICD-10-CM | POA: Diagnosis present

## 2022-08-11 DIAGNOSIS — Z91128 Patient's intentional underdosing of medication regimen for other reason: Secondary | ICD-10-CM | POA: Diagnosis not present

## 2022-08-11 DIAGNOSIS — G9340 Encephalopathy, unspecified: Secondary | ICD-10-CM | POA: Diagnosis not present

## 2022-08-11 DIAGNOSIS — Z79899 Other long term (current) drug therapy: Secondary | ICD-10-CM | POA: Diagnosis not present

## 2022-08-11 DIAGNOSIS — Z7401 Bed confinement status: Secondary | ICD-10-CM | POA: Diagnosis not present

## 2022-08-11 DIAGNOSIS — G8929 Other chronic pain: Secondary | ICD-10-CM | POA: Diagnosis not present

## 2022-08-11 DIAGNOSIS — Z7982 Long term (current) use of aspirin: Secondary | ICD-10-CM

## 2022-08-11 DIAGNOSIS — F0390 Unspecified dementia without behavioral disturbance: Secondary | ICD-10-CM | POA: Diagnosis not present

## 2022-08-11 DIAGNOSIS — R410 Disorientation, unspecified: Secondary | ICD-10-CM | POA: Diagnosis not present

## 2022-08-11 DIAGNOSIS — D472 Monoclonal gammopathy: Secondary | ICD-10-CM | POA: Diagnosis present

## 2022-08-11 DIAGNOSIS — I10 Essential (primary) hypertension: Secondary | ICD-10-CM | POA: Diagnosis present

## 2022-08-11 DIAGNOSIS — A4151 Sepsis due to Escherichia coli [E. coli]: Secondary | ICD-10-CM | POA: Diagnosis not present

## 2022-08-11 DIAGNOSIS — R531 Weakness: Secondary | ICD-10-CM | POA: Diagnosis not present

## 2022-08-11 DIAGNOSIS — R4182 Altered mental status, unspecified: Secondary | ICD-10-CM | POA: Diagnosis not present

## 2022-08-11 DIAGNOSIS — R6520 Severe sepsis without septic shock: Secondary | ICD-10-CM | POA: Diagnosis not present

## 2022-08-11 DIAGNOSIS — N182 Chronic kidney disease, stage 2 (mild): Secondary | ICD-10-CM | POA: Diagnosis not present

## 2022-08-11 DIAGNOSIS — R0902 Hypoxemia: Secondary | ICD-10-CM | POA: Diagnosis not present

## 2022-08-11 DIAGNOSIS — N1832 Chronic kidney disease, stage 3b: Secondary | ICD-10-CM | POA: Diagnosis not present

## 2022-08-11 DIAGNOSIS — Z888 Allergy status to other drugs, medicaments and biological substances status: Secondary | ICD-10-CM

## 2022-08-11 DIAGNOSIS — E876 Hypokalemia: Secondary | ICD-10-CM | POA: Diagnosis present

## 2022-08-11 DIAGNOSIS — Z515 Encounter for palliative care: Secondary | ICD-10-CM | POA: Diagnosis not present

## 2022-08-11 DIAGNOSIS — Z882 Allergy status to sulfonamides status: Secondary | ICD-10-CM

## 2022-08-11 DIAGNOSIS — R000 Tachycardia, unspecified: Secondary | ICD-10-CM | POA: Diagnosis not present

## 2022-08-11 DIAGNOSIS — Z7189 Other specified counseling: Secondary | ICD-10-CM

## 2022-08-11 DIAGNOSIS — N3090 Cystitis, unspecified without hematuria: Secondary | ICD-10-CM | POA: Diagnosis not present

## 2022-08-11 DIAGNOSIS — N390 Urinary tract infection, site not specified: Secondary | ICD-10-CM | POA: Diagnosis not present

## 2022-08-11 DIAGNOSIS — E860 Dehydration: Secondary | ICD-10-CM | POA: Diagnosis present

## 2022-08-11 DIAGNOSIS — A419 Sepsis, unspecified organism: Secondary | ICD-10-CM | POA: Diagnosis present

## 2022-08-11 DIAGNOSIS — Z800 Family history of malignant neoplasm of digestive organs: Secondary | ICD-10-CM | POA: Diagnosis not present

## 2022-08-11 DIAGNOSIS — E663 Overweight: Secondary | ICD-10-CM | POA: Diagnosis not present

## 2022-08-11 DIAGNOSIS — D6959 Other secondary thrombocytopenia: Secondary | ICD-10-CM | POA: Diagnosis present

## 2022-08-11 DIAGNOSIS — N1831 Chronic kidney disease, stage 3a: Secondary | ICD-10-CM | POA: Diagnosis present

## 2022-08-11 DIAGNOSIS — R001 Bradycardia, unspecified: Secondary | ICD-10-CM | POA: Diagnosis not present

## 2022-08-11 DIAGNOSIS — N179 Acute kidney failure, unspecified: Secondary | ICD-10-CM | POA: Diagnosis not present

## 2022-08-11 DIAGNOSIS — B957 Other staphylococcus as the cause of diseases classified elsewhere: Secondary | ICD-10-CM | POA: Diagnosis present

## 2022-08-11 DIAGNOSIS — G9341 Metabolic encephalopathy: Secondary | ICD-10-CM | POA: Diagnosis not present

## 2022-08-11 DIAGNOSIS — E871 Hypo-osmolality and hyponatremia: Secondary | ICD-10-CM | POA: Diagnosis present

## 2022-08-11 LAB — URINALYSIS, MICROSCOPIC (REFLEX): Squamous Epithelial / HPF: NONE SEEN (ref 0–5)

## 2022-08-11 LAB — BLOOD GAS, VENOUS
Acid-Base Excess: 6.4 mmol/L — ABNORMAL HIGH (ref 0.0–2.0)
Bicarbonate: 31.3 mmol/L — ABNORMAL HIGH (ref 20.0–28.0)
O2 Saturation: 59.6 %
Patient temperature: 37
pCO2, Ven: 45 mmHg (ref 44–60)
pH, Ven: 7.45 — ABNORMAL HIGH (ref 7.25–7.43)
pO2, Ven: 36 mmHg (ref 32–45)

## 2022-08-11 LAB — COMPREHENSIVE METABOLIC PANEL
ALT: 20 U/L (ref 0–44)
AST: 32 U/L (ref 15–41)
Albumin: 3.7 g/dL (ref 3.5–5.0)
Alkaline Phosphatase: 60 U/L (ref 38–126)
Anion gap: 14 (ref 5–15)
BUN: 30 mg/dL — ABNORMAL HIGH (ref 8–23)
CO2: 27 mmol/L (ref 22–32)
Calcium: 8.4 mg/dL — ABNORMAL LOW (ref 8.9–10.3)
Chloride: 98 mmol/L (ref 98–111)
Creatinine, Ser: 1.88 mg/dL — ABNORMAL HIGH (ref 0.44–1.00)
GFR, Estimated: 26 mL/min — ABNORMAL LOW (ref >60)
Glucose, Bld: 137 mg/dL — ABNORMAL HIGH (ref 70–99)
Potassium: 3 mmol/L — ABNORMAL LOW (ref 3.5–5.1)
Sodium: 139 mmol/L (ref 135–145)
Total Bilirubin: 1.4 mg/dL — ABNORMAL HIGH (ref 0.3–1.2)
Total Protein: 8.3 g/dL — ABNORMAL HIGH (ref 6.5–8.1)

## 2022-08-11 LAB — RETICULOCYTES
Immature Retic Fract: 14.7 % (ref 2.3–15.9)
RBC.: 3.29 MIL/uL — ABNORMAL LOW (ref 3.87–5.11)
Retic Count, Absolute: 31.3 10*3/uL (ref 19.0–186.0)
Retic Ct Pct: 1 % (ref 0.4–3.1)

## 2022-08-11 LAB — CBC WITH DIFFERENTIAL/PLATELET
Abs Immature Granulocytes: 0.04 10*3/uL (ref 0.00–0.07)
Basophils Absolute: 0 10*3/uL (ref 0.0–0.1)
Basophils Relative: 0 %
Eosinophils Absolute: 0 10*3/uL (ref 0.0–0.5)
Eosinophils Relative: 0 %
HCT: 37.6 % (ref 36.0–46.0)
Hemoglobin: 12.3 g/dL (ref 12.0–15.0)
Immature Granulocytes: 0 %
Lymphocytes Relative: 5 %
Lymphs Abs: 0.6 10*3/uL — ABNORMAL LOW (ref 0.7–4.0)
MCH: 31.5 pg (ref 26.0–34.0)
MCHC: 32.7 g/dL (ref 30.0–36.0)
MCV: 96.2 fL (ref 80.0–100.0)
Monocytes Absolute: 0.5 10*3/uL (ref 0.1–1.0)
Monocytes Relative: 5 %
Neutro Abs: 10.6 10*3/uL — ABNORMAL HIGH (ref 1.7–7.7)
Neutrophils Relative %: 90 %
Platelets: 162 10*3/uL (ref 150–400)
RBC: 3.91 MIL/uL (ref 3.87–5.11)
RDW: 15.3 % (ref 11.5–15.5)
WBC: 11.8 10*3/uL — ABNORMAL HIGH (ref 4.0–10.5)
nRBC: 0 % (ref 0.0–0.2)

## 2022-08-11 LAB — TROPONIN I (HIGH SENSITIVITY)
Troponin I (High Sensitivity): 11 ng/L (ref <18)
Troponin I (High Sensitivity): 13 ng/L (ref <18)

## 2022-08-11 LAB — URINALYSIS, ROUTINE W REFLEX MICROSCOPIC
Bilirubin Urine: NEGATIVE
Glucose, UA: NEGATIVE mg/dL
Ketones, ur: NEGATIVE mg/dL
Nitrite: NEGATIVE
Protein, ur: 100 mg/dL — AB
Specific Gravity, Urine: 1.015 (ref 1.005–1.030)
pH: 8 (ref 5.0–8.0)

## 2022-08-11 LAB — AMMONIA: Ammonia: 18 umol/L (ref 9–35)

## 2022-08-11 LAB — LIPASE, BLOOD: Lipase: 33 U/L (ref 11–51)

## 2022-08-11 LAB — SODIUM, URINE, RANDOM: Sodium, Ur: 16 mmol/L

## 2022-08-11 LAB — TSH: TSH: 65.626 u[IU]/mL — ABNORMAL HIGH (ref 0.350–4.500)

## 2022-08-11 LAB — CREATININE, URINE, RANDOM: Creatinine, Urine: 219 mg/dL

## 2022-08-11 MED ORDER — SODIUM CHLORIDE 0.9 % IV BOLUS
1000.0000 mL | Freq: Once | INTRAVENOUS | Status: AC
  Administered 2022-08-11: 1000 mL via INTRAVENOUS

## 2022-08-11 MED ORDER — SODIUM CHLORIDE 0.9 % IV SOLN
1.0000 g | INTRAVENOUS | Status: DC
  Administered 2022-08-12: 1 g via INTRAVENOUS
  Filled 2022-08-11: qty 10

## 2022-08-11 MED ORDER — SODIUM CHLORIDE 0.9 % IV SOLN
1.0000 g | Freq: Once | INTRAVENOUS | Status: AC
  Administered 2022-08-11: 1 g via INTRAVENOUS
  Filled 2022-08-11: qty 10

## 2022-08-11 MED ORDER — POTASSIUM CHLORIDE 10 MEQ/100ML IV SOLN
10.0000 meq | INTRAVENOUS | Status: AC
  Administered 2022-08-12 (×2): 10 meq via INTRAVENOUS
  Filled 2022-08-11 (×2): qty 100

## 2022-08-11 MED ORDER — ACETAMINOPHEN 325 MG PO TABS
650.0000 mg | ORAL_TABLET | Freq: Once | ORAL | Status: AC
  Administered 2022-08-11: 650 mg via ORAL
  Filled 2022-08-11: qty 2

## 2022-08-11 NOTE — H&P (Signed)
ALJEAN HORIUCHI KFE:[id number] DOB: May 21, 1933 DOA: 08/11/2022     PCP: [name], MD      Patient arrived to ER on 08/11/22 at 1539 Referred by Attending Rancour, Annie Main, MD   Patient coming from:    home Lives  With family    Chief Complaint:   Chief Complaint  Patient presents with   Possible UTI    HPI: CHASEY DULL is a 88 y.o. female with medical history significant of hypertension headaches hypothyroidism MGUS frequent UTIs     Presented with   fatigue and confusion Coming in from home patient lives with her son but has been more fatigued recently EMS arrived and noticed foul urine although patient was self unable to provide any history of complaints.  She has history of recurrent UTIs.  She had a Keflex bottle in the house that that was not finished. On arrival CBG 141 satting 96% on room air blood pressure 148/70 heart rate 76 Patient is self on unsure why she is here seems like the family members felt that she was more tired. Patient herself is oriented to person place but not time.  Otherwise she is not endorsing chest pain abdominal pain no fever no vomiting no nausea no diarrhea.  No dysuria       Regarding pertinent Chronic problems:        HTN on Norvasc      Hypothyroidism:  Lab Results  Component Value Date   TSH 65.626 (H) 08/11/2022   on synthroid   CKD stage IIIa- baseline Cr 1.1 CrCl cannot be calculated (Unknown ideal weight.).  Lab Results  Component Value Date   CREATININE 1.88 (H) 08/11/2022   CREATININE 1.03 (H) 12/23/2021   CREATININE 1.17 (H) 09/02/2021      While in ER:   UA showing evidence of UTI  Started on Rocephin  Also noted to have potassium down to 3.0 And AKI Markedly elevated TSH  CT HEAD   NON acute  CXR -  NON acute     Following Medications were ordered in ER: Medications  sodium chloride 0.9 % bolus 1,000 mL (has no administration in time range)  cefTRIAXone (ROCEPHIN) 1 g in sodium  chloride 0.9 % 100 mL IVPB (has no administration in time range)  acetaminophen (TYLENOL) tablet 650 mg (650 mg Oral Given 08/11/22 2036)    ______________    ED Triage Vitals  Enc Vitals Group     BP 08/11/22 1551 (!) 140/63     Pulse Rate 08/11/22 1551 77     Resp 08/11/22 1551 18     Temp 08/11/22 1553 97.9 F (36.6 C)     Temp Source 08/11/22 1553 Oral     SpO2 08/11/22 1551 96 %     Weight --      Height --      Head Circumference --      Peak Flow --      Pain Score --      Pain Loc --      Pain Edu? --      Excl. in [place]? --   TMAX(24)@     _________________________________________ Significant initial  Findings: Abnormal Labs Reviewed  CBC WITH DIFFERENTIAL/PLATELET - Abnormal; Notable for the following components:      Result Value   WBC 11.8 (*)    Neutro Abs 10.6 (*)    Lymphs Abs 0.6 (*)    All other components within normal limits  COMPREHENSIVE  METABOLIC PANEL - Abnormal; Notable for the following components:   Potassium 3.0 (*)    Glucose, Bld 137 (*)    BUN 30 (*)    Creatinine, Ser 1.88 (*)    Calcium 8.4 (*)    Total Protein 8.3 (*)    Total Bilirubin 1.4 (*)    GFR, Estimated 26 (*)    All other components within normal limits  URINALYSIS, ROUTINE W REFLEX MICROSCOPIC - Abnormal; Notable for the following components:   Hgb urine dipstick LARGE (*)    Protein, ur 100 (*)    Leukocytes,Ua MODERATE (*)    All other components within normal limits  TSH - Abnormal; Notable for the following components:   TSH 65.626 (*)    All other components within normal limits  URINALYSIS, MICROSCOPIC (REFLEX) - Abnormal; Notable for the following components:   Bacteria, UA MANY (*)    All other components within normal limits     _________________________ Troponin 13 ECG: Ordered Personally reviewed and interpreted by me showing: HR : 69 Rhythm: Low voltage, precordial leads Borderline T abnormalities, anterior leads Artifact in lead(s) I III aVL V1 V2  V3 QTC 420   ____________________ This patient meets SIRS Criteria and may be septic.    The recent clinical data is shown below. Vitals:   08/11/22 1956 08/11/22 2000 08/11/22 2025 08/11/22 2100  BP:  122/66  (!) 118/57  Pulse:  83  70  Resp:  (!) 21  (!) 22  Temp: 100.1 F (37.8 C)  (!) 100.9 F (38.3 C)   TempSrc: Oral  Axillary   SpO2:  94%  97%    WBC     Component Value Date/Time   WBC 11.8 (H) 08/11/2022 1700   LYMPHSABS 0.6 (L) 08/11/2022 1700   LYMPHSABS 2.3 07/15/2014 0949   MONOABS 0.5 08/11/2022 1700   MONOABS 0.3 07/15/2014 0949   EOSABS 0.0 08/11/2022 1700   EOSABS 0.0 07/15/2014 0949   BASOSABS 0.0 08/11/2022 1700   BASOSABS 0.0 07/15/2014 0949         UA  evidence of UTI     Urine analysis:    Component Value Date/Time   COLORURINE YELLOW 08/11/2022 2023   APPEARANCEUR CLEAR 08/11/2022 2023   LABSPEC 1.015 08/11/2022 2023   PHURINE 8.0 08/11/2022 2023   GLUCOSEU NEGATIVE 08/11/2022 2023   HGBUR LARGE (A) 08/11/2022 2023   BILIRUBINUR NEGATIVE 08/11/2022 2023   BILIRUBINUR small 12/23/2021 1634   KETONESUR NEGATIVE 08/11/2022 2023   PROTEINUR 100 (A) 08/11/2022 2023   UROBILINOGEN 1.0 12/23/2021 1634   UROBILINOGEN 0.2 03/10/2015 2228   NITRITE NEGATIVE 08/11/2022 2023   LEUKOCYTESUR MODERATE (A) 08/11/2022 2023    Results for orders placed or performed in visit on 12/23/21  Urine Culture     Status: None   Collection Time: 12/23/21  5:15 PM   Specimen: Urine   UR  Result Value Ref Range Status   Urine Culture, Routine Final report  Final   Organism ID, Bacteria Comment  Final    Comment: Mixed urogenital flora Less than 10,000 colonies/mL      _______________________________________________ Hospitalist was called for admission for sepsis, UTI, AKI   The following Work up has been ordered so far:  Orders Placed This Encounter  Procedures   DG Chest 2 View   CT Head Wo Contrast   CBC with Differential   Comprehensive  metabolic panel   Urinalysis, Routine w reflex microscopic   TSH   Ammonia  Lipase, blood   TSH   Urinalysis, Microscopic (reflex)   In and Out Cath   Consult to hospitalist   EKG 12-Lead   EKG 12-Lead     OTHER Significant initial  Findings:  labs showing:    Recent Labs  Lab 08/11/22 1700  NA 139  K 3.0*  CO2 27  GLUCOSE 137*  BUN 30*  CREATININE 1.88*  CALCIUM 8.4*    Cr    Up from baseline see below Lab Results  Component Value Date   CREATININE 1.88 (H) 08/11/2022   CREATININE 1.03 (H) 12/23/2021   CREATININE 1.17 (H) 09/02/2021    Recent Labs  Lab 08/11/22 1700  AST 32  ALT 20  ALKPHOS 60  BILITOT 1.4*  PROT 8.3*  ALBUMIN 3.7   Lab Results  Component Value Date   CALCIUM 8.4 (L) 08/11/2022    Plt: Lab Results  Component Value Date   PLT 162 08/11/2022     Venous  Blood Gas result:  pH   7.45 High  Acid-Base Excess 6.4 High  mmol/L  pCO2, Ven 45 mmHg O2 Saturation 59.6 %  pO2, Ven 36 mmHg     Recent Labs  Lab 08/11/22 1700  WBC 11.8*  NEUTROABS 10.6*  HGB 12.3  HCT 37.6  MCV 96.2  PLT 162    HG/HCT  stable,       Component Value Date/Time   HGB 12.3 08/11/2022 1700   HGB 13.0 09/02/2021 1635   HGB 12.2 07/15/2014 0949   HCT 37.6 08/11/2022 1700   HCT 38.9 09/02/2021 1635   HCT 37.6 07/15/2014 0949   MCV 96.2 08/11/2022 1700   MCV 97 09/02/2021 1635   MCV 87.6 07/15/2014 0949     Recent Labs  Lab 08/11/22 1700  LIPASE 33   Recent Labs  Lab 08/11/22 1700  AMMONIA 18    DM  labs:  HbA1C: Recent Labs    09/02/21 1635 12/23/21 1629  HGBA1C 5.9* 5.9*        Cultures: No results found for: "SDES", "SPECREQUEST", "CULT", "REPTSTATUS"   Radiological Exams on Admission: CT Head Wo Contrast  Result Date: 08/11/2022 CLINICAL DATA:  Mental status change, unknown cause EXAM: CT HEAD WITHOUT CONTRAST TECHNIQUE: Contiguous axial images were obtained from the base of the skull through the vertex without intravenous  contrast. RADIATION DOSE REDUCTION: This exam was performed according to the departmental dose-optimization program which includes automated exposure control, adjustment of the mA and/or kV according to patient size and/or use of iterative reconstruction technique. COMPARISON:  MRI head 09/02/2018, CT head 09/02/2018 BRAIN: BRAIN Cerebral ventricle sizes are concordant with the degree of cerebral volume loss. No evidence of large-territorial acute infarction. No parenchymal hemorrhage. No mass lesion. No extra-axial collection. No mass effect or midline shift. No hydrocephalus. Basilar cisterns are patent. Vascular: No hyperdense vessel. Skull: No acute fracture or focal lesion. Sinuses/Orbits: Paranasal sinuses and mastoid air cells are clear. Bilateral lens replacement. Otherwise the orbits are unremarkable. Other: None. IMPRESSION: No acute intracranial abnormality. Electronically Signed   By: Iven Finn M.D.   On: 08/11/2022 17:37   DG Chest 2 View  Result Date: 08/11/2022 CLINICAL DATA:  Altered mental status. EXAM: CHEST - 2 VIEW COMPARISON:  Chest radiograph dated 08/16/2012. FINDINGS: No focal consolidation, pleural effusion, pneumothorax. The cardiac silhouette is within normal limits. No acute osseous pathology. IMPRESSION: No active cardiopulmonary disease. Electronically Signed   By: Anner Crete M.D.   On: 08/11/2022 17:27  _______________________________________________________________________________________________________ Latest  Blood pressure (!) 118/57, pulse 70, temperature (!) 100.9 F (38.3 C), temperature source Axillary, resp. rate (!) 22, SpO2 97 %.   Vitals  labs and radiology finding personally reviewed  Review of Systems:    Pertinent positives include:   Fevers, chills, fatigue,  Constitutional:  No weight loss, night sweats, weight loss  HEENT:  No headaches, Difficulty swallowing,Tooth/dental problems,Sore throat,  No sneezing, itching, ear ache, nasal  congestion, post nasal drip,  Cardio-vascular:  No chest pain, Orthopnea, PND, anasarca, dizziness, palpitations.no Bilateral lower extremity swelling  GI:  No heartburn, indigestion, abdominal pain, nausea, vomiting, diarrhea, change in bowel habits, loss of appetite, melena, blood in stool, hematemesis Resp:  no shortness of breath at rest. No dyspnea on exertion, No excess mucus, no productive cough, No non-productive cough, No coughing up of blood.No change in color of mucus.No wheezing. Skin:  no rash or lesions. No jaundice GU:  no dysuria, change in color of urine, no urgency or frequency. No straining to urinate.  No flank pain.  Musculoskeletal:  No joint pain or no joint swelling. No decreased range of motion. No back pain.  Psych:  No change in mood or affect. No depression or anxiety. No memory loss.  Neuro: no localizing neurological complaints, no tingling, no weakness, no double vision, no gait abnormality, no slurred speech, no confusion  All systems reviewed and apart from [place] all are negative _______________________________________________________________________________________________ Past Medical History:   Past Medical History:  Diagnosis Date   Headache 12/24/2013   Headache(784.0)    Hypertension    pcp  [facility]   Hypothyroid    MGUS (monoclonal gammopathy of unknown significance) 12/10/2013     Past Surgical History:  Procedure Laterality Date   ABDOMINAL HYSTERECTOMY     bladder tac     BREAST BIOPSY Right 04/2017   CATARACT EXTRACTION W/PHACO  08/22/2012   Procedure: CATARACT EXTRACTION PHACO AND INTRAOCULAR LENS PLACEMENT ([place]);  Surgeon: Adonis Brook, MD;  Location: [facility];  Service: Ophthalmology;  Laterality: Left;   CATARACT EXTRACTION W/PHACO Right 02/13/2013   Procedure: CATARACT EXTRACTION PHACO AND INTRAOCULAR LENS PLACEMENT ([facility]);  Surgeon: Adonis Brook, MD;  Location: [facility];  Service: Ophthalmology;  Laterality: Right;    Social  History:  Ambulatory   independently      reports that she has never smoked. She has never used smokeless tobacco. She reports current alcohol use of about 7.0 standard drinks of alcohol per week. She reports that she does not use drugs.     Family History:  Family History  Problem Relation Age of Onset   Cancer Maternal Grandfather        stomach cancer   Cancer Paternal Grandfather        stomach cancer   Healthy Mother    Dementia Mother        at 69-99 y.o   Healthy Father    ______________________________________________________________________________________________ Allergies: Allergies  Allergen Reactions   Iodine Other (See Comments)    Reaction unknown   Sulfa Antibiotics Other (See Comments)    Childhood reaction     Prior to Admission medications   Medication Sig Start Date End Date Taking? Authorizing Provider  amLODipine (NORVASC) 5 MG tablet Take 1 tablet (5 mg total) by mouth daily. 09/02/21   Bary Castilla, NP  aspirin 81 MG tablet Take 81 mg by mouth daily.    [provider]  Cholecalciferol (VITAMIN D3) 125 MCG (5000 UT) CAPS Take 5,000  Units by mouth daily.    [provider]  fish oil-omega-3 fatty acids 1000 MG capsule Take 1 g by mouth daily.    [provider]  furosemide (LASIX) 40 MG tablet TAKE 1 TABLET (40 MG TOTAL) BY MOUTH DAILY AS NEEDED FOR FLUID OR EDEMA. SHE FORGETS TO TAKE IT 05/18/20   [name], MD  levothyroxine (SYNTHROID) 125 MCG tablet TAKE 1 TABLET BY MOUTH MONDAY - SATURDAY 02/07/22   [name], MD  Melatonin 5 MG TABS Take 1 tablet by mouth at bedtime as needed.    [provider]  Multiple Vitamin (MULTIVITAMIN WITH MINERALS) TABS Take 1 tablet by mouth daily.    [provider]  nystatin powder Apply to feet, in between toes twice daily Patient not taking: Reported on 12/29/2021 12/27/21   [name], MD  senna (SENOKOT) 8.6 MG TABS tablet Take 1 tablet by mouth daily as  needed for mild constipation. Patient not taking: Reported on 12/23/2021    [provider]    ___________________________________________________________________________________________________ Physical Exam:    08/11/2022    9:00 PM 08/11/2022    8:00 PM 08/11/2022    7:30 PM  Vitals with BMI  Systolic [phone]  Diastolic 57 66 51  Pulse 70 83 69     1. General:  in No  Acute distress    Chronically ill   -appearing 2. Psychological: Alert and  Oriented 3. Head/ENT:    Dry Mucous Membranes                          Head Non traumatic, neck supple                           Poor Dentition 4. SKIN: decreased Skin turgor,  Skin clean Dry and intact no rash 5. Heart: Regular rate and rhythm no  Murmur, no Rub or gallop 6. Lungs:  no wheezes or crackles   7. Abdomen: Soft,  non-tender, Non distended   8. Lower extremities: no clubbing, cyanosis, no  edema 9. Neurologically Grossly intact, moving all 4 extremities equally   10. MSK: Normal range of motion    Chart has been reviewed  ______________________________________________________________________________________________  Assessment/Plan 88 y.o. female with medical history significant of hypertension headaches hypothyroidism MGUS frequent UTIs   Admitted for  UTI sepsis, AKI   Present on Admission:  Sepsis ([place])  MGUS (monoclonal gammopathy of unknown significance)  Essential hypertension  Primary hypothyroidism  Chronic renal disease, stage II  AKI (acute kidney injury) ([place])  Hypokalemia     Sepsis ([place])  -SIRS criteria met with elevated white blood cell count,       Component Value Date/Time   WBC 11.8 (H) 08/11/2022 1700   LYMPHSABS 0.6 (L) 08/11/2022 1700   LYMPHSABS 2.3 07/15/2014 0949    fever   RR >20 Today's Vitals   08/11/22 2000 08/11/22 2025 08/11/22 2100 08/11/22 2200  BP: 122/66  (!) 118/57 (!) 109/48  Pulse: 83  70 66  Resp: (!) 21  (!) 22 14  Temp:  (!) 100.9 F (38.3 C)     TempSrc:  Axillary    SpO2: 94%  97% 95%   There is no height or weight on file to calculate BMI.  This patient meets SIRS Criteria and may be septic.   The recent clinical data is shown below. Vitals:   08/11/22 2000 08/11/22 2025  08/11/22 2100 08/11/22 2200  BP: 122/66  (!) 118/57 (!) 109/48  Pulse: 83  70 66  Resp: (!) 21  (!) 22 14  Temp:  (!) 100.9 F (38.3 C)    TempSrc:  Axillary    SpO2: 94%  97% 95%      -Most likely source being:  Urinary,   Patient meeting criteria for Severe sepsis with    evidence of end organ damage/organ dysfunction such as   acute metabolic encephalopathy       Component Value Date/Time   PLT 162 08/11/2022 1700   PLT 202 09/02/2021 1635      - Obtain serial lactic acid and procalcitonin level.  - Initiated IV antibiotics in ER: Antibiotics Given (last 72 hours)     None       Will continue  on :rocephin   - await results of blood and urine culture  - Rehydrate aggressively  Intravenous fluids were administered     10:06 PM   MGUS (monoclonal gammopathy of unknown significance) Chronic stable  Essential hypertension Allow permissive hypertension  Primary hypothyroidism Noted elevated TSH we will check T4 to free patient have had elevated TSH for at least 1 year it is unclear if she is still taking her Synthroid may need to increase the dose and repeat if she has been compliant  Chronic renal disease, stage II  -chronic avoid nephrotoxic medications such as NSAIDs, Vanco Zosyn combo,  avoid hypotension, continue to follow renal function   AKI (acute kidney injury) ([place]) In the setting of dehydration will rehydrate and follow renal function obtain urine electrolytes  Hypokalemia We will replace check magnesium level   Other plan as per orders.  DVT prophylaxis:  SCD     Code Status:    Code Status: Not on file FULL CODE  as per patient   I had personally discussed CODE STATUS with patient     Family  Communication:   Family not at  Bedside    Disposition Plan:     likely will need placement for rehabilitation                           Following barriers for discharge:                            Electrolytes corrected                                                             Afebrile, white count improving able to transition to PO antibiotics                             Will need to be able to tolerate PO                            Will likely need home health, home O2, set up  Would benefit from PT/OT eval prior to DC  Ordered                   Swallow eval - SLP ordered                                    Transition of care consulted                   Nutrition    consulted                                       Consults called: none    Admission status:  ED Disposition     ED Disposition  Admit   Condition  --   [place]: [place] [[id number]]  Level of Care: Telemetry [5]  Admit to tele based on following criteria: Other see comments  Comments: hypokalemia  May place patient in observation at [facility] or [facility] if equivalent level of care is available:: No  Covid Evaluation: Asymptomatic - no recent exposure (last 10 days) testing not required  Diagnosis: Sepsis [facility]) [[id number]]  Admitting Physician: Toy Baker [[id number]]  Attending Physician: Toy Baker [[id number]]           Obs    Level of care     tele  For 12H   Ronia Hazelett 08/11/2022, 11:49 PM    [facility]     after 2 AM please page floor coverage PA If 7AM-7PM, please contact the day team taking care of the patient using [email or web address]   Patient was evaluated in the context of the global COVID-19 pandemic, which necessitated consideration that the patient might be at risk for infection with the SARS-CoV-2 virus that causes COVID-19. Institutional protocols and algorithms that pertain to the  evaluation of patients at risk for COVID-19 are in a state of rapid change based on information released by regulatory bodies including the CDC and federal and state organizations. These policies and algorithms were followed during the patient's care.

## 2022-08-11 NOTE — Assessment & Plan Note (Signed)
-  SIRS criteria met with elevated white blood cell count,       Component Value Date/Time   WBC 11.8 (H) 08/11/2022 1700   LYMPHSABS 0.6 (L) 08/11/2022 1700   LYMPHSABS 2.3 07/15/2014 0949    fever   RR >20 Today's Vitals   08/11/22 2000 08/11/22 2025 08/11/22 2100 08/11/22 2200  BP: 122/66  (!) 118/57 (!) 109/48  Pulse: 83  70 66  Resp: (!) 21  (!) 22 14  Temp:  (!) 100.9 F (38.3 C)    TempSrc:  Axillary    SpO2: 94%  97% 95%   There is no height or weight on file to calculate BMI.  This patient meets SIRS Criteria and may be septic.   The recent clinical data is shown below. Vitals:   08/11/22 2000 08/11/22 2025 08/11/22 2100 08/11/22 2200  BP: 122/66  (!) 118/57 (!) 109/48  Pulse: 83  70 66  Resp: (!) 21  (!) 22 14  Temp:  (!) 100.9 F (38.3 C)    TempSrc:  Axillary    SpO2: 94%  97% 95%      -Most likely source being:  Urinary,   Patient meeting criteria for Severe sepsis with    evidence of end organ damage/organ dysfunction such as   acute metabolic encephalopathy       Component Value Date/Time   PLT 162 08/11/2022 1700   PLT 202 09/02/2021 1635      - Obtain serial lactic acid and procalcitonin level.  - Initiated IV antibiotics in ER: Antibiotics Given (last 72 hours)    None      Will continue  on :rocephin   - await results of blood and urine culture  - Rehydrate aggressively  Intravenous fluids were administered     10:06 PM

## 2022-08-11 NOTE — ED Triage Notes (Addendum)
Pt BIB EMS from home. Pt lives with son. Pt friend called EMS, stating that patient is acting more tired than normal, EMS reports foul urine odor in house. Pt has no complaints at this time. Pt is alert and oriented to self. Pt has hx of UTI's, EMS reports pt had a keflex bottle in house that was not empty.  CBG 141 96% RA 148/70 76 HR

## 2022-08-11 NOTE — Assessment & Plan Note (Signed)
Allow permissive hypertension 

## 2022-08-11 NOTE — ED Provider Notes (Signed)
[facility] DEPT Provider Note   CSN: [id number] Arrival date & time: 08/11/22  1539     History  Chief Complaint  Patient presents with   Possible UTI    Brianna [name] is a 88 y.o. female.  Patient brought in via EMS from home with concern for UTI.  Patient does not know why she is here.  Apparently family members called because she was acting more tired than normal and smelled of urine and there was concern for UTI.  Patient denies any complaints.  She is oriented to person and place but believes the month is December.  She denies any head, neck, back, chest or abdominal pain.  No fever.  No vomiting or diarrhea.  No pain with urination or blood in the urine. She does not know who called the ambulance.  The history is provided by the patient and the EMS personnel.       Home Medications Prior to Admission medications   Medication Sig Start Date End Date Taking? Authorizing Provider  amLODipine (NORVASC) 5 MG tablet Take 1 tablet (5 mg total) by mouth daily. 09/02/21   Bary Castilla, NP  aspirin 81 MG tablet Take 81 mg by mouth daily.    [provider]  Cholecalciferol (VITAMIN D3) 125 MCG (5000 UT) CAPS Take 5,000 Units by mouth daily.    [provider]  fish oil-omega-3 fatty acids 1000 MG capsule Take 1 g by mouth daily.    [provider]  furosemide (LASIX) 40 MG tablet TAKE 1 TABLET (40 MG TOTAL) BY MOUTH DAILY AS NEEDED FOR FLUID OR EDEMA. SHE FORGETS TO TAKE IT 05/18/20   [name], MD  levothyroxine (SYNTHROID) 125 MCG tablet TAKE 1 TABLET BY MOUTH MONDAY - SATURDAY 02/07/22   [name], MD  Melatonin 5 MG TABS Take 1 tablet by mouth at bedtime as needed.    [provider]  Multiple Vitamin (MULTIVITAMIN WITH MINERALS) TABS Take 1 tablet by mouth daily.    [provider]  nystatin powder Apply to feet, in between toes twice daily Patient not taking: Reported on 12/29/2021  12/27/21   [name], MD  senna (SENOKOT) 8.6 MG TABS tablet Take 1 tablet by mouth daily as needed for mild constipation. Patient not taking: Reported on 12/23/2021    [provider]      Allergies    Iodine and Sulfa antibiotics    Review of Systems   Review of Systems  Constitutional:  Negative for activity change, appetite change and fever.  HENT:  Negative for congestion.   Respiratory:  Negative for cough, chest tightness and shortness of breath.   Cardiovascular:  Negative for chest pain.  Gastrointestinal:  Negative for abdominal pain, nausea and vomiting.  Genitourinary:  Negative for dysuria and hematuria.  Musculoskeletal:  Negative for arthralgias and myalgias.  Skin:  Negative for rash.  Neurological:  Negative for dizziness, weakness and headaches.   all other systems are negative except as noted in the HPI and PMH.    Physical Exam Updated Vital Signs BP (!) 140/63   Pulse 77   Temp 97.9 F (36.6 C) (Oral)   Resp 18   SpO2 96%  Physical Exam Vitals and nursing note reviewed.  Constitutional:      General: She is not in acute distress.    Appearance: She is well-developed.     Comments: Disheveled  HENT:     Head: Normocephalic and atraumatic.  Mouth/Throat:     Pharynx: No oropharyngeal exudate.  Eyes:     Conjunctiva/sclera: Conjunctivae normal.     Pupils: Pupils are equal, round, and reactive to light.  Neck:     Comments: No meningismus. Cardiovascular:     Rate and Rhythm: Normal rate and regular rhythm.     Heart sounds: Normal heart sounds. No murmur heard. Pulmonary:     Effort: Pulmonary effort is normal. No respiratory distress.     Breath sounds: Normal breath sounds.  Abdominal:     Palpations: Abdomen is soft.     Tenderness: There is no abdominal tenderness. There is no guarding or rebound.  Musculoskeletal:        General: No tenderness. Normal range of motion.     Cervical back: Normal range of motion and neck  supple.  Skin:    General: Skin is warm.  Neurological:     Mental Status: She is alert.     Cranial Nerves: No cranial nerve deficit.     Motor: No abnormal muscle tone.     Coordination: Coordination normal.     Comments:  5/5 strength throughout. CN 2-12 intact.Equal grip strength.  Oriented to person and place.  Believes month is December.  Psychiatric:        Behavior: Behavior normal.     ED Results / Procedures / Treatments   Labs (all labs ordered are listed, but only abnormal results are displayed) Labs Reviewed  CBC WITH DIFFERENTIAL/PLATELET - Abnormal; Notable for the following components:      Result Value   WBC 11.8 (*)    Neutro Abs 10.6 (*)    Lymphs Abs 0.6 (*)    All other components within normal limits  COMPREHENSIVE METABOLIC PANEL - Abnormal; Notable for the following components:   Potassium 3.0 (*)    Glucose, Bld 137 (*)    BUN 30 (*)    Creatinine, Ser 1.88 (*)    Calcium 8.4 (*)    Total Protein 8.3 (*)    Total Bilirubin 1.4 (*)    GFR, Estimated 26 (*)    All other components within normal limits  URINALYSIS, ROUTINE W REFLEX MICROSCOPIC - Abnormal; Notable for the following components:   Hgb urine dipstick LARGE (*)    Protein, ur 100 (*)    Leukocytes,Ua MODERATE (*)    All other components within normal limits  TSH - Abnormal; Notable for the following components:   TSH 65.626 (*)    All other components within normal limits  URINALYSIS, MICROSCOPIC (REFLEX) - Abnormal; Notable for the following components:   Bacteria, UA MANY (*)    All other components within normal limits  LACTIC ACID, PLASMA - Abnormal; Notable for the following components:   Lactic Acid, Venous 2.1 (*)    All other components within normal limits  PROTIME-INR - Abnormal; Notable for the following components:   Prothrombin Time 15.4 (*)    All other components within normal limits  APTT - Abnormal; Notable for the following components:   aPTT 37 (*)    All other  components within normal limits  COMPREHENSIVE METABOLIC PANEL - Abnormal; Notable for the following components:   Potassium 2.7 (*)    Chloride 97 (*)    Glucose, Bld 122 (*)    BUN 30 (*)    Creatinine, Ser 1.76 (*)    Calcium 7.4 (*)    Albumin 3.0 (*)    GFR, Estimated 28 (*)  All other components within normal limits  PHOSPHORUS - Abnormal; Notable for the following components:   Phosphorus 2.3 (*)    All other components within normal limits  BLOOD GAS, VENOUS - Abnormal; Notable for the following components:   pH, Ven 7.45 (*)    Bicarbonate 31.3 (*)    Acid-Base Excess 6.4 (*)    All other components within normal limits  FOLATE - Abnormal; Notable for the following components:   Folate 5.1 (*)    All other components within normal limits  IRON AND TIBC - Abnormal; Notable for the following components:   Iron 23 (*)    TIBC 184 (*)    All other components within normal limits  FERRITIN - Abnormal; Notable for the following components:   Ferritin 466 (*)    All other components within normal limits  RETICULOCYTES - Abnormal; Notable for the following components:   RBC. 3.29 (*)    All other components within normal limits  CULTURE, BLOOD (ROUTINE X 2)  CULTURE, BLOOD (ROUTINE X 2)  URINE CULTURE  AMMONIA  LIPASE, BLOOD  CK  MAGNESIUM  CREATININE, URINE, RANDOM  SODIUM, URINE, RANDOM  VITAMIN B12  TSH  CBC WITH DIFFERENTIAL/PLATELET  LACTIC ACID, PLASMA  PROCALCITONIN  PREALBUMIN  OSMOLALITY  OSMOLALITY, URINE  T3  T4, FREE  TROPONIN I (HIGH SENSITIVITY)  TROPONIN I (HIGH SENSITIVITY)    EKG EKG Interpretation  Date/Time:  Thursday August 11 2022 17:33:23 EDT Ventricular Rate:  69 PR Interval:    QRS Duration: 96 QT Interval:  392 QTC Calculation: 420 R Axis:   16 Text Interpretation: sinus Low voltage, precordial leads Borderline T abnormalities, anterior leads Artifact in lead(s) I III aVL V1 V2 V3 Interpretation limited secondary to  artifact No significant change was found Confirmed by Ezequiel Essex [phone]) on 08/11/2022 5:39:47 PM  Radiology CT Head Wo Contrast  Result Date: 08/11/2022 CLINICAL DATA:  Mental status change, unknown cause EXAM: CT HEAD WITHOUT CONTRAST TECHNIQUE: Contiguous axial images were obtained from the base of the skull through the vertex without intravenous contrast. RADIATION DOSE REDUCTION: This exam was performed according to the departmental dose-optimization program which includes automated exposure control, adjustment of the mA and/or kV according to patient size and/or use of iterative reconstruction technique. COMPARISON:  MRI head 09/02/2018, CT head 09/02/2018 BRAIN: BRAIN Cerebral ventricle sizes are concordant with the degree of cerebral volume loss. No evidence of large-territorial acute infarction. No parenchymal hemorrhage. No mass lesion. No extra-axial collection. No mass effect or midline shift. No hydrocephalus. Basilar cisterns are patent. Vascular: No hyperdense vessel. Skull: No acute fracture or focal lesion. Sinuses/Orbits: Paranasal sinuses and mastoid air cells are clear. Bilateral lens replacement. Otherwise the orbits are unremarkable. Other: None. IMPRESSION: No acute intracranial abnormality. Electronically Signed   By: Iven Finn M.D.   On: 08/11/2022 17:37   DG Chest 2 View  Result Date: 08/11/2022 CLINICAL DATA:  Altered mental status. EXAM: CHEST - 2 VIEW COMPARISON:  Chest radiograph dated 08/16/2012. FINDINGS: No focal consolidation, pleural effusion, pneumothorax. The cardiac silhouette is within normal limits. No acute osseous pathology. IMPRESSION: No active cardiopulmonary disease. Electronically Signed   By: Anner Crete M.D.   On: 08/11/2022 17:27    Procedures Procedures    Medications Ordered in ED Medications - No data to display  ED Course/ Medical Decision Making/ A&P  Medical Decision Making Amount and/or  Complexity of Data Reviewed Labs: ordered. Decision-making details documented in ED Course. Radiology: ordered and independent interpretation performed. Decision-making details documented in ED Course. ECG/medicine tests: ordered and independent interpretation performed. Decision-making details documented in ED Course.  Risk OTC drugs. Decision regarding hospitalization.   Patient brought in from home with concern for UTI.  She has no complaints.  Vitals are stable.  Abdomen is soft and nontender.  Attempted to contact patient's son Marcello Moores at numbers listed but no answer was received.  IVF begun. Infectious and metabolic workup pursued.  CT Head and CXR without acute findings. Reviewed and interpreted by me.  Labs with AKI, hypokalemia, elevated TSH, hypokalemia.  Patient's brother has arrived. He states he last saw her a week ago. She has not been herself for several days, confused, smelling of urine.   Initiate IVF, IV antibiotics after cultures obtained. Replace potassium Does not appear toxic or septic.   Given AMS, UTI, AKI, hypoK, will plan admission.  D/w Dr. Roel Cluck.         Final Clinical Impression(s) / ED Diagnoses Final diagnoses:  None    Rx / DC Orders ED Discharge Orders     None         Ashraf Mesta, Annie Main, MD 08/12/22 0107

## 2022-08-11 NOTE — ED Notes (Signed)
Patient transported to CT 

## 2022-08-11 NOTE — Assessment & Plan Note (Signed)
In the setting of dehydration will rehydrate and follow renal function obtain urine electrolytes

## 2022-08-11 NOTE — Assessment & Plan Note (Signed)
Chronic-stable.

## 2022-08-11 NOTE — ED Notes (Signed)
Pure wick placed.

## 2022-08-11 NOTE — Assessment & Plan Note (Signed)
-  chronic avoid nephrotoxic medications such as NSAIDs, Vanco Zosyn combo,  avoid hypotension, continue to follow renal function  

## 2022-08-11 NOTE — Subjective & Objective (Signed)
Coming in from home patient lives with her son but has been more fatigued recently EMS arrived and noticed foul urine although patient was self unable to provide any history of complaints.  She has history of recurrent UTIs.  She had a Keflex bottle in the house that that was not finished. On arrival CBG 141 satting 96% on room air blood pressure 148/70 heart rate 76 Patient is self on unsure why she is here seems like the family members felt that she was more tired. Patient herself is oriented to person place but not time.  Otherwise she is not endorsing chest pain abdominal pain no fever no vomiting no nausea no diarrhea.  No dysuria

## 2022-08-11 NOTE — Assessment & Plan Note (Signed)
Noted elevated TSH we will check T4 to free patient have had elevated TSH for at least 1 year it is unclear if she is still taking her Synthroid may need to increase the dose and repeat if she has been compliant

## 2022-08-11 NOTE — Assessment & Plan Note (Signed)
We will replace check magnesium level ?

## 2022-08-12 DIAGNOSIS — Z800 Family history of malignant neoplasm of digestive organs: Secondary | ICD-10-CM | POA: Diagnosis not present

## 2022-08-12 DIAGNOSIS — R410 Disorientation, unspecified: Secondary | ICD-10-CM

## 2022-08-12 DIAGNOSIS — Z79899 Other long term (current) drug therapy: Secondary | ICD-10-CM | POA: Diagnosis not present

## 2022-08-12 DIAGNOSIS — R6520 Severe sepsis without septic shock: Secondary | ICD-10-CM | POA: Diagnosis present

## 2022-08-12 DIAGNOSIS — A4151 Sepsis due to Escherichia coli [E. coli]: Secondary | ICD-10-CM | POA: Diagnosis present

## 2022-08-12 DIAGNOSIS — E871 Hypo-osmolality and hyponatremia: Secondary | ICD-10-CM | POA: Diagnosis present

## 2022-08-12 DIAGNOSIS — Z91128 Patient's intentional underdosing of medication regimen for other reason: Secondary | ICD-10-CM | POA: Diagnosis not present

## 2022-08-12 DIAGNOSIS — N179 Acute kidney failure, unspecified: Secondary | ICD-10-CM | POA: Diagnosis present

## 2022-08-12 DIAGNOSIS — D631 Anemia in chronic kidney disease: Secondary | ICD-10-CM | POA: Diagnosis present

## 2022-08-12 DIAGNOSIS — F0390 Unspecified dementia without behavioral disturbance: Secondary | ICD-10-CM | POA: Diagnosis not present

## 2022-08-12 DIAGNOSIS — N182 Chronic kidney disease, stage 2 (mild): Secondary | ICD-10-CM | POA: Diagnosis not present

## 2022-08-12 DIAGNOSIS — N3090 Cystitis, unspecified without hematuria: Secondary | ICD-10-CM | POA: Diagnosis present

## 2022-08-12 DIAGNOSIS — A419 Sepsis, unspecified organism: Secondary | ICD-10-CM | POA: Diagnosis not present

## 2022-08-12 DIAGNOSIS — Z515 Encounter for palliative care: Secondary | ICD-10-CM | POA: Diagnosis not present

## 2022-08-12 DIAGNOSIS — E860 Dehydration: Secondary | ICD-10-CM | POA: Diagnosis present

## 2022-08-12 DIAGNOSIS — Z90710 Acquired absence of both cervix and uterus: Secondary | ICD-10-CM | POA: Diagnosis not present

## 2022-08-12 DIAGNOSIS — I10 Essential (primary) hypertension: Secondary | ICD-10-CM | POA: Diagnosis not present

## 2022-08-12 DIAGNOSIS — G9341 Metabolic encephalopathy: Secondary | ICD-10-CM | POA: Diagnosis present

## 2022-08-12 DIAGNOSIS — Z87440 Personal history of urinary (tract) infections: Secondary | ICD-10-CM | POA: Diagnosis not present

## 2022-08-12 DIAGNOSIS — Z7189 Other specified counseling: Secondary | ICD-10-CM

## 2022-08-12 DIAGNOSIS — K922 Gastrointestinal hemorrhage, unspecified: Secondary | ICD-10-CM | POA: Diagnosis present

## 2022-08-12 DIAGNOSIS — N1831 Chronic kidney disease, stage 3a: Secondary | ICD-10-CM | POA: Diagnosis present

## 2022-08-12 DIAGNOSIS — D6959 Other secondary thrombocytopenia: Secondary | ICD-10-CM | POA: Diagnosis present

## 2022-08-12 DIAGNOSIS — E876 Hypokalemia: Secondary | ICD-10-CM | POA: Diagnosis present

## 2022-08-12 DIAGNOSIS — E039 Hypothyroidism, unspecified: Secondary | ICD-10-CM | POA: Diagnosis present

## 2022-08-12 DIAGNOSIS — R4182 Altered mental status, unspecified: Secondary | ICD-10-CM | POA: Diagnosis present

## 2022-08-12 DIAGNOSIS — D472 Monoclonal gammopathy: Secondary | ICD-10-CM | POA: Diagnosis present

## 2022-08-12 DIAGNOSIS — I129 Hypertensive chronic kidney disease with stage 1 through stage 4 chronic kidney disease, or unspecified chronic kidney disease: Secondary | ICD-10-CM | POA: Diagnosis present

## 2022-08-12 DIAGNOSIS — T381X6A Underdosing of thyroid hormones and substitutes, initial encounter: Secondary | ICD-10-CM | POA: Diagnosis present

## 2022-08-12 DIAGNOSIS — Z882 Allergy status to sulfonamides status: Secondary | ICD-10-CM | POA: Diagnosis not present

## 2022-08-12 DIAGNOSIS — Z79890 Hormone replacement therapy (postmenopausal): Secondary | ICD-10-CM | POA: Diagnosis not present

## 2022-08-12 LAB — COMPREHENSIVE METABOLIC PANEL
ALT: 16 U/L (ref 0–44)
ALT: 16 U/L (ref 0–44)
AST: 24 U/L (ref 15–41)
AST: 22 U/L (ref 15–41)
Albumin: 3 g/dL — ABNORMAL LOW (ref 3.5–5.0)
Albumin: 2.9 g/dL — ABNORMAL LOW (ref 3.5–5.0)
Alkaline Phosphatase: 50 U/L (ref 38–126)
Alkaline Phosphatase: 52 U/L (ref 38–126)
Anion gap: 11 (ref 5–15)
Anion gap: 10 (ref 5–15)
BUN: 30 mg/dL — ABNORMAL HIGH (ref 8–23)
BUN: 26 mg/dL — ABNORMAL HIGH (ref 8–23)
CO2: 28 mmol/L (ref 22–32)
CO2: 26 mmol/L (ref 22–32)
Calcium: 7.4 mg/dL — ABNORMAL LOW (ref 8.9–10.3)
Calcium: 7.5 mg/dL — ABNORMAL LOW (ref 8.9–10.3)
Chloride: 97 mmol/L — ABNORMAL LOW (ref 98–111)
Chloride: 101 mmol/L (ref 98–111)
Creatinine, Ser: 1.76 mg/dL — ABNORMAL HIGH (ref 0.44–1.00)
Creatinine, Ser: 1.56 mg/dL — ABNORMAL HIGH (ref 0.44–1.00)
GFR, Estimated: 28 mL/min — ABNORMAL LOW (ref >60)
GFR, Estimated: 32 mL/min — ABNORMAL LOW (ref >60)
Glucose, Bld: 122 mg/dL — ABNORMAL HIGH (ref 70–99)
Glucose, Bld: 121 mg/dL — ABNORMAL HIGH (ref 70–99)
Potassium: 2.7 mmol/L — CL (ref 3.5–5.1)
Potassium: 3.1 mmol/L — ABNORMAL LOW (ref 3.5–5.1)
Sodium: 136 mmol/L (ref 135–145)
Sodium: 137 mmol/L (ref 135–145)
Total Bilirubin: 1 mg/dL (ref 0.3–1.2)
Total Bilirubin: 1 mg/dL (ref 0.3–1.2)
Total Protein: 7 g/dL (ref 6.5–8.1)
Total Protein: 6.7 g/dL (ref 6.5–8.1)

## 2022-08-12 LAB — CBC WITH DIFFERENTIAL/PLATELET
Abs Immature Granulocytes: 0.12 K/uL — ABNORMAL HIGH (ref 0.00–0.07)
Basophils Absolute: 0 K/uL (ref 0.0–0.1)
Basophils Relative: 0 %
Eosinophils Absolute: 0 K/uL (ref 0.0–0.5)
Eosinophils Relative: 0 %
HCT: 32.6 % — ABNORMAL LOW (ref 36.0–46.0)
Hemoglobin: 10.6 g/dL — ABNORMAL LOW (ref 12.0–15.0)
Immature Granulocytes: 2 %
Lymphocytes Relative: 11 %
Lymphs Abs: 0.9 K/uL (ref 0.7–4.0)
MCH: 31.5 pg (ref 26.0–34.0)
MCHC: 32.5 g/dL (ref 30.0–36.0)
MCV: 97 fL (ref 80.0–100.0)
Monocytes Absolute: 0.6 K/uL (ref 0.1–1.0)
Monocytes Relative: 7 %
Neutro Abs: 6.5 K/uL (ref 1.7–7.7)
Neutrophils Relative %: 80 %
Platelets: 130 K/uL — ABNORMAL LOW (ref 150–400)
RBC: 3.36 MIL/uL — ABNORMAL LOW (ref 3.87–5.11)
RDW: 15.3 % (ref 11.5–15.5)
WBC: 8.1 K/uL (ref 4.0–10.5)
nRBC: 0 % (ref 0.0–0.2)

## 2022-08-12 LAB — IRON AND TIBC
Iron: 23 ug/dL — ABNORMAL LOW (ref 28–170)
Saturation Ratios: 13 % (ref 10.4–31.8)
TIBC: 184 ug/dL — ABNORMAL LOW (ref 250–450)
UIBC: 161 ug/dL

## 2022-08-12 LAB — OSMOLALITY: Osmolality: 294 mOsm/kg (ref 275–295)

## 2022-08-12 LAB — APTT: aPTT: 37 seconds — ABNORMAL HIGH (ref 24–36)

## 2022-08-12 LAB — PHOSPHORUS: Phosphorus: 2.3 mg/dL — ABNORMAL LOW (ref 2.5–4.6)

## 2022-08-12 LAB — LACTIC ACID, PLASMA
Lactic Acid, Venous: 2.1 mmol/L (ref 0.5–1.9)
Lactic Acid, Venous: 1.8 mmol/L (ref 0.5–1.9)

## 2022-08-12 LAB — MAGNESIUM: Magnesium: 1.8 mg/dL (ref 1.7–2.4)

## 2022-08-12 LAB — PROCALCITONIN: Procalcitonin: 4.83 ng/mL

## 2022-08-12 LAB — FOLATE: Folate: 5.1 ng/mL — ABNORMAL LOW (ref >5.9)

## 2022-08-12 LAB — T4, FREE: Free T4: <0.25 ng/dL — ABNORMAL LOW (ref 0.61–1.12)

## 2022-08-12 LAB — PROTIME-INR
INR: 1.2 (ref 0.8–1.2)
Prothrombin Time: 15.4 seconds — ABNORMAL HIGH (ref 11.4–15.2)

## 2022-08-12 LAB — FERRITIN: Ferritin: 466 ng/mL — ABNORMAL HIGH (ref 11–307)

## 2022-08-12 LAB — VITAMIN B12: Vitamin B-12: 223 pg/mL (ref 180–914)

## 2022-08-12 LAB — OSMOLALITY, URINE: Osmolality, Ur: 585 mOsm/kg (ref 300–900)

## 2022-08-12 LAB — CK: Total CK: 199 U/L (ref 38–234)

## 2022-08-12 MED ORDER — ASPIRIN 81 MG PO TBEC
81.0000 mg | DELAYED_RELEASE_TABLET | Freq: Every day | ORAL | Status: DC
  Administered 2022-08-12 – 2022-08-17 (×6): 81 mg via ORAL
  Filled 2022-08-12 (×6): qty 1

## 2022-08-12 MED ORDER — ADULT MULTIVITAMIN W/MINERALS CH
1.0000 | ORAL_TABLET | Freq: Every day | ORAL | Status: DC
  Administered 2022-08-12 – 2022-08-17 (×6): 1 via ORAL
  Filled 2022-08-12 (×6): qty 1

## 2022-08-12 MED ORDER — LEVOTHYROXINE SODIUM 125 MCG PO TABS
125.0000 ug | ORAL_TABLET | Freq: Every day | ORAL | Status: DC
  Administered 2022-08-13 – 2022-08-17 (×5): 125 ug via ORAL
  Filled 2022-08-12 (×5): qty 1

## 2022-08-12 MED ORDER — POTASSIUM CHLORIDE 2 MEQ/ML IV SOLN
INTRAVENOUS | Status: DC
  Filled 2022-08-12 (×2): qty 1000

## 2022-08-12 MED ORDER — POTASSIUM CHLORIDE CRYS ER 20 MEQ PO TBCR
40.0000 meq | EXTENDED_RELEASE_TABLET | ORAL | Status: DC
  Administered 2022-08-12: 40 meq via ORAL
  Filled 2022-08-12: qty 2

## 2022-08-12 MED ORDER — POTASSIUM CHLORIDE 20 MEQ PO PACK
40.0000 meq | PACK | Freq: Once | ORAL | Status: AC
  Administered 2022-08-12: 40 meq via ORAL
  Filled 2022-08-12: qty 2

## 2022-08-12 MED ORDER — ORAL CARE MOUTH RINSE
15.0000 mL | OROMUCOSAL | Status: DC | PRN
  Administered 2022-08-16: 15 mL via OROMUCOSAL

## 2022-08-12 MED ORDER — HEPARIN SODIUM (PORCINE) 5000 UNIT/ML IJ SOLN
5000.0000 [IU] | Freq: Three times a day (TID) | INTRAMUSCULAR | Status: DC
  Administered 2022-08-12 – 2022-08-17 (×15): 5000 [IU] via SUBCUTANEOUS
  Filled 2022-08-12 (×15): qty 1

## 2022-08-12 NOTE — Progress Notes (Signed)
Attempted to do nursing admission history. Pt unable to answer questions. Obtained information from computer and completed what could be done.  Doroteo Bradford BSN, RN-BC Throughput Nurse 08/12/2022 8:09 PM

## 2022-08-12 NOTE — Progress Notes (Signed)
PROGRESS NOTE  Brianna [name] DGL:[id number] DOB: Jan 30, 1933   PCP: [name], MD  Patient is from: Home.  Reports living with son.  Reports ambulating independently at baseline.  DOA: 08/11/2022 LOS: 0  Chief complaints Chief Complaint  Patient presents with   Possible UTI     Brief Narrative / Interim history: 88 year old F with PMH of dementia, HTN, hypothyroidism, MGUS and recurrent UTI presenting with confusion and fatigue, and admitted for sepsis due to UTI and AKI.[name]  Patient was febrile to 100.9 with some leukocytosis.  UA concerning for UTI.  Take acid 2.1.  Last Pro-Cal 4.83.  TSH elevated to 66.  Cultures and thyroid panel ordered.  Started on IV fluid and IV antibiotics  Subjective: Seen and examined earlier this morning.  No major events overnight of this morning.  Patient is awake and alert.  She has no complaint but has no insight into why she is in the hospital.  She is oriented to self and place but not time.  She denies pain, cough, chest pain, trouble breathing, nausea, vomiting or UTI symptoms.  Objective: Vitals:   08/12/22 0930 08/12/22 1030 08/12/22 1215 08/12/22 1300  BP: 125/61 (!) 115/59 (!) 108/56 113/63  Pulse: 62 (!) 58 63 (!) 56  Resp: '18 16 19 12  '$ Temp:  99.8 F (37.7 C)    TempSrc:  Oral    SpO2: 100% 94% 93% 97%    Examination:  GENERAL: Appears frail.  No apparent distress. HEENT: MMM.  Vision and hearing grossly intact.  NECK: Supple.  No apparent JVD.  RESP:  No IWOB.  Fair aeration bilaterally. CVS:  RRR. Heart sounds normal.  ABD/GI/GU: BS+. Abd soft, NTND.  MSK/EXT:  Moves extremities. No apparent deformity.  Trace ankle edema, right> left. SKIN: no apparent skin lesion or wound NEURO: Awake, alert and oriented to self and place.  Follows commands.  No apparent focal neuro deficit. PSYCH: Calm. Normal affect.   Procedures:  None  Microbiology summarized: Blood cultures pending Urine culture pending  Assessment and  plan: Principal Problem:   Severe sepsis ([place]) Active Problems:   MGUS (monoclonal gammopathy of unknown significance)   Essential hypertension   Dementia ([facility])   Hypothyroidism   Chronic renal disease, stage II   AKI (acute kidney injury) ([place])   Hypokalemia   Goals of care, counseling/discussion   Severe sepsis due to urinary tract infection: Febrile to 100.9 with leukocytosis, lactic acidosis, AKI and altered mental status.  UA concerning for UTI.  She did denies UTI symptoms but patient is not a reliable historian due to dementia.  Hemodynamically stable. -Continue IV ceftriaxone pending cultures  Confusion/dementia without behavioral disturbance: She is awake and alert but only oriented to self and place which seem to be the case on admission as well.  She has no focal neuro symptoms.  CT head without acute finding.  Concern about UTI.  TSH markedly elevated to 66.   -Treat treatable causes -Reorientation and delirium precaution  Hypothyroidism/markedly elevated TSH: Per patient's brother, patient has not been taking her medications. -Follow free T4 and total T3. -Resume home Synthroid at 125 mcg daily  AKI/azotemia: Improving. Recent Labs    09/02/21 1635 12/23/21 1629 08/11/22 1700 08/11/22 2310 08/12/22 0343  BUN 23 14 30* 30* 26*  CREATININE 1.17* 1.03* 1.88* 1.76* 1.56*  -Continue IV fluid  Normocytic anemia: Likely anemia of renal disease Recent Labs    09/02/21 1635 08/11/22 1700 08/11/22 2310  HGB 13.0 12.3  10.6*  -Check anemia panel -Monitor H&H  Generalized weakness: Multifactorial.  Reports ambulating independently at baseline. -PT/OT eval  Lactic acidosis: Resolved.  Mild thrombocytopenia: Likely reactive. -Monitor  Hypokalemia: -Monitor replenish as appropriate  Goal of care counseling: Briefly discussed CODE STATUS including pros and cons of CPR and intubation with patient's brother over the phone.  He voiced understanding but prefers to  continue full code. -Palliative care consulted  There is no height or weight on file to calculate BMI.           DVT prophylaxis:  heparin injection 5,000 Units Start: 08/12/22 1415  Code Status: Full code-confirmed with patient's brother over the phone Family Communication: Updated patient's brother over the phone Level of care: Telemetry Status is: Observation The patient will require care spanning > 2 midnights and should be moved to inpatient because: Urinary tract infection, altered mental status, AKI and hypothyroidism   Final disposition: TBD Consultants:  Palliative medicine  Sch Meds:  Scheduled Meds:  aspirin EC  81 mg Oral Daily   heparin injection (subcutaneous)  5,000 Units Subcutaneous Q8H   [START ON 08/13/2022] levothyroxine  125 mcg Oral Q0600   multivitamin with minerals  1 tablet Oral Daily   potassium chloride  40 mEq Oral Once   Continuous Infusions:  cefTRIAXone (ROCEPHIN)  IV     lactated ringers 1,000 mL with potassium chloride 20 mEq infusion 100 mL/hr at 08/12/22 1037   PRN Meds:.  Antimicrobials: Anti-infectives (From admission, onward)    Start     Dose/Rate Route Frequency Ordered Stop   08/12/22 2200  cefTRIAXone (ROCEPHIN) 1 g in sodium chloride 0.9 % 100 mL IVPB        1 g 200 mL/hr over 30 Minutes Intravenous Every 24 hours 08/11/22 2207     08/11/22 2130  cefTRIAXone (ROCEPHIN) 1 g in sodium chloride 0.9 % 100 mL IVPB        1 g 200 mL/hr over 30 Minutes Intravenous  Once 08/11/22 2122 08/11/22 2230        I have personally reviewed the following labs and images: CBC: Recent Labs  Lab 08/11/22 1700 08/11/22 2310  WBC 11.8* 8.1  NEUTROABS 10.6* 6.5  HGB 12.3 10.6*  HCT 37.6 32.6*  MCV 96.2 97.0  PLT 162 130*   BMP &GFR Recent Labs  Lab 08/11/22 1700 08/11/22 2310 08/12/22 0343  NA 139 136 137  K 3.0* 2.7* 3.1*  CL 98 97* 101  CO2 '27 28 26  '$ GLUCOSE 137* 122* 121*  BUN 30* 30* 26*  CREATININE 1.88* 1.76*  1.56*  CALCIUM 8.4* 7.4* 7.5*  MG  --  1.8  --   PHOS  --  2.3*  --    CrCl cannot be calculated (Unknown ideal weight.). Liver & Pancreas: Recent Labs  Lab 08/11/22 1700 08/11/22 2310 08/12/22 0343  AST 32 24 22  ALT '20 16 16  '$ ALKPHOS 60 50 52  BILITOT 1.4* 1.0 1.0  PROT 8.3* 7.0 6.7  ALBUMIN 3.7 3.0* 2.9*   Recent Labs  Lab 08/11/22 1700  LIPASE 33   Recent Labs  Lab 08/11/22 1700  AMMONIA 18   Diabetic: No results for input(s): "HGBA1C" in the last 72 hours. No results for input(s): "GLUCAP" in the last 168 hours. Cardiac Enzymes: Recent Labs  Lab 08/11/22 2310  CKTOTAL 199   No results for input(s): "PROBNP" in the last 8760 hours. Coagulation Profile: Recent Labs  Lab 08/11/22 2310  INR 1.2  Thyroid Function Tests: Recent Labs    08/11/22 1700  TSH 65.626*   Lipid Profile: No results for input(s): "CHOL", "HDL", "LDLCALC", "TRIG", "CHOLHDL", "LDLDIRECT" in the last 72 hours. Anemia Panel: Recent Labs    08/11/22 2310  VITAMINB12 223  FOLATE 5.1*  FERRITIN 466*  TIBC 184*  IRON 23*  RETICCTPCT 1.0   Urine analysis:    Component Value Date/Time   COLORURINE YELLOW 08/11/2022 2023   APPEARANCEUR CLEAR 08/11/2022 2023   LABSPEC 1.015 08/11/2022 2023   PHURINE 8.0 08/11/2022 2023   GLUCOSEU NEGATIVE 08/11/2022 2023   HGBUR LARGE (A) 08/11/2022 2023   BILIRUBINUR NEGATIVE 08/11/2022 2023   BILIRUBINUR small 12/23/2021 [place] 08/11/2022 2023   PROTEINUR 100 (A) 08/11/2022 2023   UROBILINOGEN 1.0 12/23/2021 1634   UROBILINOGEN 0.2 03/10/2015 2228   NITRITE NEGATIVE 08/11/2022 2023   LEUKOCYTESUR MODERATE (A) 08/11/2022 2023   Sepsis Labs: Invalid input(s): "PROCALCITONIN", "LACTICIDVEN"  Microbiology: No results found for this or any previous visit (from the past 240 hour(s)).  Radiology Studies: CT Head Wo Contrast  Result Date: 08/11/2022 CLINICAL DATA:  Mental status change, unknown cause EXAM: CT HEAD  WITHOUT CONTRAST TECHNIQUE: Contiguous axial images were obtained from the base of the skull through the vertex without intravenous contrast. RADIATION DOSE REDUCTION: This exam was performed according to the departmental dose-optimization program which includes automated exposure control, adjustment of the mA and/or kV according to patient size and/or use of iterative reconstruction technique. COMPARISON:  MRI head 09/02/2018, CT head 09/02/2018 BRAIN: BRAIN Cerebral ventricle sizes are concordant with the degree of cerebral volume loss. No evidence of large-territorial acute infarction. No parenchymal hemorrhage. No mass lesion. No extra-axial collection. No mass effect or midline shift. No hydrocephalus. Basilar cisterns are patent. Vascular: No hyperdense vessel. Skull: No acute fracture or focal lesion. Sinuses/Orbits: Paranasal sinuses and mastoid air cells are clear. Bilateral lens replacement. Otherwise the orbits are unremarkable. Other: None. IMPRESSION: No acute intracranial abnormality. Electronically Signed   By: Iven Finn M.D.   On: 08/11/2022 17:37   DG Chest 2 View  Result Date: 08/11/2022 CLINICAL DATA:  Altered mental status. EXAM: CHEST - 2 VIEW COMPARISON:  Chest radiograph dated 08/16/2012. FINDINGS: No focal consolidation, pleural effusion, pneumothorax. The cardiac silhouette is within normal limits. No acute osseous pathology. IMPRESSION: No active cardiopulmonary disease. Electronically Signed   By: Anner Crete M.D.   On: 08/11/2022 17:27      Klani Caridi T. Panacea  If 7PM-7AM, please contact night-coverage [email or web address] 08/12/2022, 2:13 PM

## 2022-08-12 NOTE — ED Notes (Signed)
PT at bedside to assess patient 

## 2022-08-12 NOTE — Evaluation (Signed)
Physical Therapy Evaluation Patient Details Name: Brianna [name] MRN: [id number] DOB: 30-Mar-1933 Today's Date: 08/12/2022  History of Present Illness  Brianna [name] is a 88 y.o. female presents with fatigue and confusion. Pt with working diagnosis of sepsis, UTI. PMH: HTN, headache, hypothyroid, monoclonal gammopathy of unknown significance  Clinical Impression  Pt admitted with above diagnosis. Pt oriented to self and being in hospital, unable to answer month, year, current president or situation. Pt needing significant assistance with mobility and max cues for sequencing. Pt appears to have difficulty motor planning and needing redirection to task at hand often. Pt needing max A to come to sitting EOB, able to sit EOB with UE support. Multiple attempts to power to upright standing, but achieves squatted standing position only with maxA+1. Pt needing max A+2 for return to supine and to reposition in bed. Per chart review, pt from home with son; recommending ST SNF prior to return home with family due to significant assist and weakness noted.  Pt currently with functional limitations due to the deficits listed below (see PT Problem List). Pt will benefit from skilled PT to increase their independence and safety with mobility to allow discharge to the venue listed below.          Recommendations for follow up therapy are one component of a multi-disciplinary discharge planning process, led by the attending physician.  Recommendations may be updated based on patient status, additional functional criteria and insurance authorization.  Follow Up Recommendations Skilled nursing-short term rehab (<3 hours/day) Can patient physically be transported by private vehicle: No    Assistance Recommended at Discharge Frequent or constant Supervision/Assistance  Patient can return home with the following  A lot of help with walking and/or transfers;A lot of help with bathing/dressing/bathroom;Assistance  with cooking/housework;Assist for transportation    Equipment Recommendations None recommended by PT (defer to next venue)  Recommendations for Other Services       Functional Status Assessment Patient has had a recent decline in their functional status and demonstrates the ability to make significant improvements in function in a reasonable and predictable amount of time.     Precautions / Restrictions Precautions Precautions: Fall Restrictions Weight Bearing Restrictions: No      Mobility  Bed Mobility Overal bed mobility: Needs Assistance Bed Mobility: Supine to Sit, Sit to Supine  Supine to sit: Max assist Sit to supine: Max assist, +2 for physical assistance  General bed mobility comments: max A to come to sitting EOB with max verbal cues for hand placement and task; +2 needed for return to supine for trunk and BLE assist, constant cues for sequencing    Transfers Overall transfer level: Needs assistance Equipment used:  (straight back chair in room) Transfers: Sit to/from Stand Sit to Stand: Max assist  General transfer comment: pt able to maintain static sitting EOB with CGA; trialed STS x3 attempts but only able to achieve squatted standing position with max A+1, pt verbalizes she thinks she is standing, though she is only ~3 inches off of bed in stooped position    Ambulation/Gait   Stairs   Wheelchair Mobility    Modified Rankin (Stroke Patients Only)       Balance Overall balance assessment: Needs assistance Sitting-balance support: Bilateral upper extremity supported Sitting balance-Leahy Scale: Fair  Standing balance support: During functional activity, Bilateral upper extremity supported, Reliant on assistive device for balance Standing balance-Leahy Scale: Poor       Pertinent Vitals/Pain Pain Assessment Pain Assessment: No/denies  pain    Home Living Family/patient expects to be discharged to:: Unsure  Additional Comments: pt reports living  with son but unable to provide further details    Prior Function Prior Level of Function : Patient poor historian/Family not available      Hand Dominance        Extremity/Trunk Assessment   Upper Extremity Assessment Upper Extremity Assessment: Defer to OT evaluation    Lower Extremity Assessment Lower Extremity Assessment: Generalized weakness (AROM WFL, strength grossly 3+/5, denies numbness/tingling)    Cervical / Trunk Assessment Cervical / Trunk Assessment: Kyphotic  Communication   Communication: No difficulties  Cognition Arousal/Alertness: Awake/alert Behavior During Therapy: WFL for tasks assessed/performed Overall Cognitive Status: No family/caregiver present to determine baseline cognitive functioning  General Comments: Pt pleasant, minimally conversational with therapist, states "hmm, wait a minute" when therapist asks date, current president or provides cues for movement, pt able to state name, being in hospital and no pain        General Comments      Exercises     Assessment/Plan    PT Assessment Patient needs continued PT services  PT Problem List Decreased strength;Decreased activity tolerance;Decreased balance;Decreased mobility;Decreased cognition;Decreased knowledge of use of DME;Decreased safety awareness       PT Treatment Interventions DME instruction;Gait training;Functional mobility training;Therapeutic activities;Therapeutic exercise;Patient/family education    PT Goals (Current goals can be found in the Care Plan section)  Acute Rehab PT Goals Patient Stated Goal: "can I have some of that fruit" PT Goal Formulation: With patient Time For Goal Achievement: 08/26/22 Potential to Achieve Goals: Good    Frequency Min 2X/week     Co-evaluation               AM-PAC PT "6 Clicks" Mobility  Outcome Measure Help needed turning from your back to your side while in a flat bed without using bedrails?: A Lot Help needed moving from  lying on your back to sitting on the side of a flat bed without using bedrails?: A Lot Help needed moving to and from a bed to a chair (including a wheelchair)?: Total Help needed standing up from a chair using your arms (e.g., wheelchair or bedside chair)?: Total Help needed to walk in hospital room?: Total Help needed climbing 3-5 steps with a railing? : Total 6 Click Score: 8    End of Session Equipment Utilized During Treatment: Gait belt Activity Tolerance: Patient tolerated treatment well Patient left: in bed;with call bell/phone within reach Nurse Communication: Mobility status PT Visit Diagnosis: Unsteadiness on feet (R26.81);Muscle weakness (generalized) (M62.81);Difficulty in walking, not elsewhere classified (R26.2)    Time: 9563-8756 PT Time Calculation (min) (ACUTE ONLY): 25 min   Charges:   PT Evaluation $PT Eval Low Complexity: 1 Low PT Treatments $Therapeutic Activity: 8-22 mins         Tori Okechukwu Regnier PT, DPT 08/12/22, 2:12 PM

## 2022-08-13 DIAGNOSIS — A419 Sepsis, unspecified organism: Secondary | ICD-10-CM | POA: Diagnosis not present

## 2022-08-13 DIAGNOSIS — N3090 Cystitis, unspecified without hematuria: Secondary | ICD-10-CM

## 2022-08-13 DIAGNOSIS — Z515 Encounter for palliative care: Secondary | ICD-10-CM | POA: Diagnosis not present

## 2022-08-13 DIAGNOSIS — R5381 Other malaise: Secondary | ICD-10-CM

## 2022-08-13 DIAGNOSIS — N179 Acute kidney failure, unspecified: Secondary | ICD-10-CM | POA: Diagnosis not present

## 2022-08-13 LAB — TSH: TSH: 56.094 u[IU]/mL — ABNORMAL HIGH (ref 0.350–4.500)

## 2022-08-13 LAB — BLOOD CULTURE ID PANEL (REFLEXED) - BCID2

## 2022-08-13 LAB — COMPREHENSIVE METABOLIC PANEL
ALT: 24 U/L (ref 0–44)
AST: 41 U/L (ref 15–41)
Albumin: 2.5 g/dL — ABNORMAL LOW (ref 3.5–5.0)
Alkaline Phosphatase: 52 U/L (ref 38–126)
Anion gap: 10 (ref 5–15)
BUN: 20 mg/dL (ref 8–23)
CO2: 26 mmol/L (ref 22–32)
Calcium: 7.7 mg/dL — ABNORMAL LOW (ref 8.9–10.3)
Chloride: 100 mmol/L (ref 98–111)
Creatinine, Ser: 1.32 mg/dL — ABNORMAL HIGH (ref 0.44–1.00)
GFR, Estimated: 39 mL/min — ABNORMAL LOW (ref >60)
Glucose, Bld: 97 mg/dL (ref 70–99)
Potassium: 3.2 mmol/L — ABNORMAL LOW (ref 3.5–5.1)
Sodium: 136 mmol/L (ref 135–145)
Total Bilirubin: 0.6 mg/dL (ref 0.3–1.2)
Total Protein: 6.3 g/dL — ABNORMAL LOW (ref 6.5–8.1)

## 2022-08-13 LAB — URINE CULTURE: Culture: >=100000 — AB

## 2022-08-13 LAB — CBC
HCT: 30.8 % — ABNORMAL LOW (ref 36.0–46.0)
Hemoglobin: 10.1 g/dL — ABNORMAL LOW (ref 12.0–15.0)
MCH: 30.9 pg (ref 26.0–34.0)
MCHC: 32.8 g/dL (ref 30.0–36.0)
MCV: 94.2 fL (ref 80.0–100.0)
Platelets: 135 10*3/uL — ABNORMAL LOW (ref 150–400)
RBC: 3.27 MIL/uL — ABNORMAL LOW (ref 3.87–5.11)
RDW: 15 % (ref 11.5–15.5)
WBC: 5.6 10*3/uL (ref 4.0–10.5)
nRBC: 0 % (ref 0.0–0.2)

## 2022-08-13 LAB — CK: Total CK: 385 U/L — ABNORMAL HIGH (ref 38–234)

## 2022-08-13 LAB — MAGNESIUM: Magnesium: 2 mg/dL (ref 1.7–2.4)

## 2022-08-13 LAB — PHOSPHORUS: Phosphorus: 1.6 mg/dL — ABNORMAL LOW (ref 2.5–4.6)

## 2022-08-13 MED ORDER — CEFADROXIL 500 MG PO CAPS
500.0000 mg | ORAL_CAPSULE | Freq: Two times a day (BID) | ORAL | Status: AC
  Administered 2022-08-13 – 2022-08-16 (×6): 500 mg via ORAL
  Filled 2022-08-13 (×6): qty 1

## 2022-08-13 MED ORDER — POTASSIUM PHOSPHATES 15 MMOLE/5ML IV SOLN
30.0000 mmol | Freq: Once | INTRAVENOUS | Status: AC
  Administered 2022-08-13: 30 mmol via INTRAVENOUS
  Filled 2022-08-13: qty 10

## 2022-08-13 MED ORDER — KCL-LACTATED RINGERS 20 MEQ/L IV SOLN
INTRAVENOUS | Status: DC
  Filled 2022-08-13: qty 1000

## 2022-08-13 MED ORDER — POTASSIUM CHLORIDE 2 MEQ/ML IV SOLN
INTRAVENOUS | Status: DC
  Filled 2022-08-13 (×6): qty 1000

## 2022-08-13 MED ORDER — POTASSIUM CHLORIDE 20 MEQ PO PACK
40.0000 meq | PACK | ORAL | Status: AC
  Administered 2022-08-13 (×2): 40 meq via ORAL
  Filled 2022-08-13 (×2): qty 2

## 2022-08-13 NOTE — Consult Note (Signed)
Consultation Note Date: 08/13/2022   Patient Name: Brianna [name]  DOB: 05/16/1933  MRN: [id number]  Age / Sex: 88 y.o., female   PCP: [name], MD Referring Physician: Mercy Riding, MD  Reason for Consultation: Establishing goals of care     Chief Complaint/History of Present Illness:   Patient is an 88 year old female with a past medical history of dementia, hypertension, hypothyroidism, MGUS, and recurrent urinary tract infections who is admitted on 08/11/2022 for management of confusion and fatigue.  During hospitalization, patient has been managed for sepsis secondary to UTI which also caused worsening AKI.  Patient has been managed with IV fluids and antibiotics.  Palliative care consulted to assist with complex medical decision making.  Review of EMR prior to presenting to bedside.  When presenting to bedside, patient laying comfortably in bed.  Patient's meal in front of her and appears to have eaten around 40%.  No family present at bedside.  Patient easily awakens as she was resting at initiation of visit.  Able to introduce myself and the role of palliative care team and her care.  Inquired how patient was feeling at this time and she noted that she is feeling better.  She feels like her mentation is improving and she denies any symptoms of pain.  She knows that she is in the hospital currently.  Patient reports that she had been at home with her son who assists in her care there. (Of note, son's contact information NOT listed under demographics).  Patient confirmed that she used to have 2 sons though 1 passed away. Provided emotional support. Patient described how when she is at home she normally enjoys doing things around the house such as sewing.  Inquired what patient was hoping for moving forward and she notes that she wants to work to regain her strength to return home and continue performing activities that bring her joy.  Inquired if patient would be willing to go to a  rehab to further support regaining her strength and she noted that she would be willing to do this.  Patient denied any symptoms of concern at this time.  Thanked patient for allowing this provider to visit with her today.  Primary Diagnoses  Present on Admission:  Severe sepsis (HCC)  MGUS (monoclonal gammopathy of unknown significance)  Essential hypertension  Hypothyroidism  Chronic renal disease, stage II  AKI (acute kidney injury) ([place])  Hypokalemia   Palliative Review of Systems: Denies any symptoms of concern at this time.   Past Medical History:  Diagnosis Date   Headache 12/24/2013   Headache(784.0)    Hypertension    pcp  [facility]   Hypothyroid    MGUS (monoclonal gammopathy of unknown significance) 12/10/2013   Social History   Socioeconomic History   Marital status: Widowed    Spouse name: Not on file   Number of children: 3   Years of education: 17.5-18    Highest education level: Bachelor's degree (e.g., BA, AB, BS)  Occupational History   Occupation: retired  Tobacco Use   Smoking status: Never   Smokeless tobacco: Never  Vaping Use   Vaping Use: Never used  Substance and Sexual Activity   Alcohol use: Yes    Alcohol/week: 7.0 standard drinks of alcohol    Types: 7 Shots of liquor per week    Comment: occasionally   Drug use: No   Sexual activity: Not Currently  Other Topics Concern   Not on file  Social History  Narrative   Lives at home. Her sons live with her.    Right handed   Social Determinants of Health   Financial Resource Strain: Low Risk  (12/29/2021)   Overall Financial Resource Strain (CARDIA)    Difficulty of Paying Living Expenses: Not hard at all  Food Insecurity: No Food Insecurity (12/29/2021)   Hunger Vital Sign    Worried About Running Out of Food in the Last Year: Never true    Ran Out of Food in the Last Year: Never true  Transportation Needs: No Transportation Needs (12/29/2021)   PRAPARE - Radiographer, therapeutic (Medical): No    Lack of Transportation (Non-Medical): No  Physical Activity: Sufficiently Active (12/29/2021)   Exercise Vital Sign    Days of Exercise per Week: 7 days    Minutes of Exercise per Session: 30 min  Stress: No Stress Concern Present (12/29/2021)   [place]    Feeling of Stress : Not at all  Social Connections: Somewhat Isolated (12/27/2018)   Social Connection and Isolation Panel [NHANES]    Frequency of Communication with Friends and Family: More than three times a week    Frequency of Social Gatherings with Friends and Family: More than three times a week    Attends Religious Services: Never    Marine scientist or Organizations: Yes    Attends Music therapist: More than 4 times per year    Marital Status: Widowed   Family History  Problem Relation Age of Onset   Cancer Maternal Grandfather        stomach cancer   Cancer Paternal Grandfather        stomach cancer   Healthy Mother    Dementia Mother        at 70-99 y.o   Healthy Father    Scheduled Meds:  aspirin EC  81 mg Oral Daily   cefadroxil  500 mg Oral BID   heparin injection (subcutaneous)  5,000 Units Subcutaneous Q8H   levothyroxine  125 mcg Oral Q0600   multivitamin with minerals  1 tablet Oral Daily   potassium chloride  40 mEq Oral Q4H   Continuous Infusions:  potassium PHOSPHATE IVPB (in mmol) 30 mmol (08/13/22 1014)   PRN Meds:.mouth rinse Allergies  Allergen Reactions   Iodine Other (See Comments)    Reaction unknown   Sulfa Antibiotics Other (See Comments)    Childhood reaction   CBC:    Component Value Date/Time   WBC 5.6 08/13/2022 0530   HGB 10.1 (L) 08/13/2022 0530   HGB 13.0 09/02/2021 1635   HGB 12.2 07/15/2014 0949   HCT 30.8 (L) 08/13/2022 0530   HCT 38.9 09/02/2021 1635   HCT 37.6 07/15/2014 0949   PLT 135 (L) 08/13/2022 0530   PLT 202 09/02/2021 1635   MCV 94.2  08/13/2022 0530   MCV 97 09/02/2021 1635   MCV 87.6 07/15/2014 0949   NEUTROABS 6.5 08/11/2022 2310   NEUTROABS 2.1 07/15/2014 0949   LYMPHSABS 0.9 08/11/2022 2310   LYMPHSABS 2.3 07/15/2014 0949   MONOABS 0.6 08/11/2022 2310   MONOABS 0.3 07/15/2014 0949   EOSABS 0.0 08/11/2022 2310   EOSABS 0.0 07/15/2014 0949   BASOSABS 0.0 08/11/2022 2310   BASOSABS 0.0 07/15/2014 0949   Comprehensive Metabolic Panel:    Component Value Date/Time   NA 136 08/13/2022 0530   NA 145 (H) 12/23/2021 1629   NA 142  07/15/2014 0949   K 3.2 (L) 08/13/2022 0530   K 3.7 07/15/2014 0949   CL 100 08/13/2022 0530   CO2 26 08/13/2022 0530   CO2 29 07/15/2014 0949   BUN 20 08/13/2022 0530   BUN 14 12/23/2021 1629   BUN 19.2 07/15/2014 0949   CREATININE 1.32 (H) 08/13/2022 0530   CREATININE 1.50 (H) 11/12/2020 1507   CREATININE 1.0 07/15/2014 0949   GLUCOSE 97 08/13/2022 0530   GLUCOSE 98 07/15/2014 0949   CALCIUM 7.7 (L) 08/13/2022 0530   CALCIUM 9.5 07/15/2014 0949   AST 41 08/13/2022 0530   AST 17 11/12/2020 1507   AST 16 07/15/2014 0949   ALT 24 08/13/2022 0530   ALT 9 11/12/2020 1507   ALT 14 07/15/2014 0949   ALKPHOS 52 08/13/2022 0530   ALKPHOS 67 07/15/2014 0949   BILITOT 0.6 08/13/2022 0530   BILITOT <0.2 12/23/2021 1629   BILITOT 0.2 (L) 11/12/2020 1507   BILITOT 0.28 07/15/2014 0949   PROT 6.3 (L) 08/13/2022 0530   PROT 8.0 12/23/2021 1629   PROT 7.7 07/15/2014 0949   ALBUMIN 2.5 (L) 08/13/2022 0530   ALBUMIN 4.5 12/23/2021 1629   ALBUMIN 3.6 07/15/2014 0949    Physical Exam: Vital Signs: BP 120/63 (BP Location: Right Arm)   Pulse (!) 57   Temp 98.3 F (36.8 C)   Resp 18   Ht _0  (1.676 m)   Wt 73.8 kg   SpO2 100%   BMI 26.26 kg/m  SpO2: SpO2: 100 % O2 Device: O2 Device: Room Air O2 Flow Rate:   Intake/output summary:  Intake/Output Summary (Last 24 hours) at 08/13/2022 1305 Last data filed at 08/13/2022 1014 Gross per 24 hour  Intake 1590.16 ml  Output 500 ml   Net 1090.16 ml   LBM: Last BM Date : 08/13/22 Baseline Weight: Weight: 73.8 kg Most recent weight: Weight: 73.8 kg  General: NAD, laying in bed, alert, interactive, pleasant Eyes: No discharge noted HENT: moist mucous membranes Cardiovascular: RRR Respiratory: no increase work of breathing noted, not in respiratory distress Abdomen: not distended Extremities: no edema in LE b/l Skin: no rashes or lesions on visible skin Neuro: Alert, interactive, participating in conversation       Palliative Performance Scale: 40%            Additional Data Reviewed: Recent Labs    08/11/22 2310 08/12/22 0343 08/13/22 0530  WBC 8.1  --  5.6  HGB 10.6*  --  10.1*  PLT 130*  --  135*  NA 136 137 136  BUN 30* 26* 20  CREATININE 1.76* 1.56* 1.32*    Imaging: CT Head Wo Contrast CLINICAL DATA:  Mental status change, unknown cause  EXAM: CT HEAD WITHOUT CONTRAST  TECHNIQUE: Contiguous axial images were obtained from the base of the skull through the vertex without intravenous contrast.  RADIATION DOSE REDUCTION: This exam was performed according to the departmental dose-optimization program which includes automated exposure control, adjustment of the mA and/or kV according to patient size and/or use of iterative reconstruction technique.  COMPARISON:  MRI head 09/02/2018, CT head 09/02/2018  BRAIN: BRAIN Cerebral ventricle sizes are concordant with the degree of cerebral volume loss.  No evidence of large-territorial acute infarction. No parenchymal hemorrhage. No mass lesion. No extra-axial collection.  No mass effect or midline shift. No hydrocephalus. Basilar cisterns are patent.  Vascular: No hyperdense vessel.  Skull: No acute fracture or focal lesion.  Sinuses/Orbits: Paranasal sinuses and mastoid air cells  are clear. Bilateral lens replacement. Otherwise the orbits are unremarkable.  Other: None.  IMPRESSION: No acute intracranial  abnormality.  Electronically Signed   By: Iven Finn M.D.   On: 08/11/2022 17:37 DG Chest 2 View CLINICAL DATA:  Altered mental status.  EXAM: CHEST - 2 VIEW  COMPARISON:  Chest radiograph dated 08/16/2012.  FINDINGS: No focal consolidation, pleural effusion, pneumothorax. The cardiac silhouette is within normal limits. No acute osseous pathology.  IMPRESSION: No active cardiopulmonary disease.  Electronically Signed   By: Anner Crete M.D.   On: 08/11/2022 17:27    I personally reviewed recent imaging.   Palliative Care Assessment and Plan Summary of Established Goals of Care and Medical Treatment Preferences   Patient is an 88 year old female with a past medical history of dementia, hypertension, hypothyroidism, MGUS, and recurrent urinary tract infections who is admitted on 08/11/2022 for management of confusion and fatigue.  During hospitalization, patient has been managed for sepsis secondary to UTI which also caused worsening AKI.  Patient has been managed with IV fluids and antibiotics.  Palliative care consulted to assist with complex medical decision making.  # Complex medical decision making/goals of care  -Met with patient as described above in HPI.  No family present at bedside.  Patient notes enjoyment performing activities at home such as slowing.  Patient is willing to go to rehab facility to work on regaining her strength.  -Patient states that she has 2 sons, though 1 passed away.  She noted she has a son at home who helps her around the house.  She is hopeful to return home once she regains her strength.   -Patient only has a brother and niece listed as contacts under demographics.  H&P on admission and patient herself states son lives with her to assist in support of care.  Would recommend obtaining son's contact information for  if patient is widowed and only has 1 child, her son would be the next of kin to assist in making medical decisions for the  patient unless she has paperwork stating otherwise.  Should patient not have any children, then decision making would fall to majority of reasonably available siblings.  EMR reviewed and no ACP mentation noted on file.  -  Code Status: Full Code    -Did not discuss with patient on his own visit as walking to build rapport.  # Symptom management  Patient denies any symptoms of concern at this time.  # Psycho-social/Spiritual Support:  - Support System: Patient noted she had 2 sons though 1 passed away.  Patient expressed living with one of her sons currently who assist her with work around the house.  # Discharge Planning: PT/OT have recommended subacute rehab for patient.  Patient herself notes willingness to go to rehab to regain her strength so she can return home to continue activities she enjoys.   -Should be appropriate on discharge, would recommend home palliative care follow-up to continue discussions with family and allow for completion of ACP documentation to support patient's goals for medical care moving forward.  Thank you for allowing the palliative care team to participate in the care [facility].  Chelsea Aus, DO Palliative Care Provider  PMT # [phone]  This provider spent a total of 56 minutes providing patient's care.  Includes review of EMR, discussing care with other staff members involved in patient's medical care, obtaining relevant history and information from patient and/or patient's family, and personal review of imaging and lab work.  Greater than 50% of the time was spent counseling and coordinating care related to the above assessment and plan.

## 2022-08-13 NOTE — Progress Notes (Signed)
PHARMACY - PHYSICIAN COMMUNICATION CRITICAL VALUE ALERT - BLOOD CULTURE IDENTIFICATION (BCID)  Brianna [name] is an 88 y.o. female who presented to [facility] on 08/11/2022 with a chief complaint of fatigue, confusion.   Assessment: severe sepsis due to UTI (pan-sensitive E.coli). Received call from Micro lab that blood cultures 1/4 bottles with gram positive cocci in clusters, BCID = Staph species, no resistance detected.   Name of physician (or Provider) Contacted: Dr. Cyndia Skeeters  Current antibiotics: Ceftriaxone   Changes to prescribed antibiotics recommended:  Per discussion with MD, GPC in clusters in 1/4 blood culture bottles likely contaminant. Will narrow Ceftriaxone to Cefadroxil '500mg'$  PO BID based on urine cultures.   Results for orders placed or performed during the hospital encounter of 08/11/22  Blood Culture ID Panel (Reflexed) (Collected: 08/11/2022 10:56 PM)  Result Value Ref Range   Enterococcus faecalis NOT DETECTED NOT DETECTED   Enterococcus Faecium NOT DETECTED NOT DETECTED   Listeria monocytogenes NOT DETECTED NOT DETECTED   Staphylococcus species DETECTED (A) NOT DETECTED   Staphylococcus aureus (BCID) NOT DETECTED NOT DETECTED   Staphylococcus epidermidis NOT DETECTED NOT DETECTED   Staphylococcus lugdunensis NOT DETECTED NOT DETECTED   Streptococcus species NOT DETECTED NOT DETECTED   Streptococcus agalactiae NOT DETECTED NOT DETECTED   Streptococcus pneumoniae NOT DETECTED NOT DETECTED   Streptococcus pyogenes NOT DETECTED NOT DETECTED   A.calcoaceticus-baumannii NOT DETECTED NOT DETECTED   Bacteroides fragilis NOT DETECTED NOT DETECTED   Enterobacterales NOT DETECTED NOT DETECTED   Enterobacter cloacae complex NOT DETECTED NOT DETECTED   Escherichia coli NOT DETECTED NOT DETECTED   Klebsiella aerogenes NOT DETECTED NOT DETECTED   Klebsiella oxytoca NOT DETECTED NOT DETECTED   Klebsiella pneumoniae NOT DETECTED NOT DETECTED   Proteus species NOT DETECTED  NOT DETECTED   Salmonella species NOT DETECTED NOT DETECTED   Serratia marcescens NOT DETECTED NOT DETECTED   Haemophilus influenzae NOT DETECTED NOT DETECTED   Neisseria meningitidis NOT DETECTED NOT DETECTED   Pseudomonas aeruginosa NOT DETECTED NOT DETECTED   Stenotrophomonas maltophilia NOT DETECTED NOT DETECTED   Candida albicans NOT DETECTED NOT DETECTED   Candida auris NOT DETECTED NOT DETECTED   Candida glabrata NOT DETECTED NOT DETECTED   Candida krusei NOT DETECTED NOT DETECTED   Candida parapsilosis NOT DETECTED NOT DETECTED   Candida tropicalis NOT DETECTED NOT DETECTED   Cryptococcus neoformans/gattii NOT DETECTED NOT DETECTED    [name] 08/13/2022  11:50 AM

## 2022-08-13 NOTE — Evaluation (Signed)
Occupational Therapy Evaluation Patient Details Name: Brianna [name] MRN: [id number] DOB: 08/26/1933 Today's Date: 08/13/2022   History of Present Illness Brianna [name] is a 88 y.o. female presents with fatigue and confusion. Pt with working diagnosis of sepsis, UTI. PMH: HTN, headache, hypothyroid, monoclonal gammopathy of unknown significance   Clinical Impression   Pt lives with her son and reports sponge bathing and helping him with IADLs at times. Pt is only able to offer limited PLOF and home set up information and no family available to confirm. Pt presents with impaired cognition, generalized weakness and inability to stand. She demonstrated ability to perform one grooming activity with set up and to self feed with set up. All other ADLs require mod to total assist. Recommending ST rehab prior to return home.      Recommendations for follow up therapy are one component of a multi-disciplinary discharge planning process, led by the attending physician.  Recommendations may be updated based on patient status, additional functional criteria and insurance authorization.   Follow Up Recommendations  Skilled nursing-short term rehab (<3 hours/day)    Assistance Recommended at Discharge Frequent or constant Supervision/Assistance  Patient can return home with the following Two people to help with walking and/or transfers;A lot of help with bathing/dressing/bathroom;Assistance with cooking/housework;Assistance with feeding;Direct supervision/assist for medications management;Direct supervision/assist for financial management;Assist for transportation;Help with stairs or ramp for entrance    Functional Status Assessment  Patient has had a recent decline in their functional status and demonstrates the ability to make significant improvements in function in a reasonable and predictable amount of time.  Equipment Recommendations   (defer to next venue)    Recommendations for Other  Services       Precautions / Restrictions Precautions Precautions: Fall Restrictions Weight Bearing Restrictions: No      Mobility Bed Mobility Overal bed mobility: Needs Assistance Bed Mobility: Rolling, Supine to Sit, Sit to Supine Rolling: Max assist   Supine to sit: Max assist Sit to supine: Max assist        Transfers   Equipment used: Rolling walker (2 wheels) Transfers: Sit to/from [employer] transfer comment: pt unable to stand with one person max assist, flexed posture, able to clear buttocks a few inches      Balance Overall balance assessment: Needs assistance Sitting-balance support: Single extremity supported Sitting balance-Leahy Scale: Fair       Standing balance-Leahy Scale: Zero                             ADL either performed or assessed with clinical judgement   ADL Overall ADL's : Needs assistance/impaired Eating/Feeding: Set up;Sitting Eating/Feeding Details (indicate cue type and reason): assist to open containers and cut up food Grooming: Wash/dry hands;Bed level;Set up Grooming Details (indicate cue type and reason): washed hands prior to eating Upper Body Bathing: Maximal assistance;Sitting   Lower Body Bathing: Total assistance;Bed level;Sitting/lateral leans   Upper Body Dressing : Moderate assistance;Sitting   Lower Body Dressing: Total assistance;Sitting/lateral leans;Bed level       Toileting- Clothing Manipulation and Hygiene: Total assistance;Bed level               Vision Baseline Vision/History: 1 Wears glasses Ability to See in Adequate Light: 0 Adequate Patient Visual Report: No change from baseline       Perception Perception Comments: tends to keep head  turned toward L, but regards entire food tray   Praxis      Pertinent Vitals/Pain Pain Assessment Pain Assessment: Faces Faces Pain Scale: No hurt     Hand Dominance Right   Extremity/Trunk Assessment Upper  Extremity Assessment Upper Extremity Assessment: Generalized weakness   Lower Extremity Assessment Lower Extremity Assessment: Defer to PT evaluation   Cervical / Trunk Assessment Cervical / Trunk Assessment: Kyphotic   Communication Communication Communication: No difficulties   Cognition Arousal/Alertness: Awake/alert Behavior During Therapy: Flat affect Overall Cognitive Status: No family/caregiver present to determine baseline cognitive functioning                                 General Comments: Pt cooperative, answers questions, follows commands within context of activity, impaired memory, disoriented to time and situation.     General Comments       Exercises     Shoulder Instructions      Home Living Family/patient expects to be discharged to:: Private residence Living Arrangements: Children (son)   Type of Home: House       Home Layout: Multi-level                   Additional Comments: pt only able to provide limited PLOF and home set up      Prior Functioning/Environment Prior Level of Function : Patient poor historian/Family not available               ADLs Comments: reports helping son with IADLs at times, sponge bathes        OT Problem List: Decreased strength;Decreased activity tolerance;Impaired balance (sitting and/or standing);Decreased cognition;Decreased knowledge of use of DME or AE      OT Treatment/Interventions: Self-care/ADL training;DME and/or AE instruction;Therapeutic activities;Patient/family education;Balance training    OT Goals(Current goals can be found in the care plan section) Acute Rehab OT Goals OT Goal Formulation: Patient unable to participate in goal setting Time For Goal Achievement: 08/27/22 Potential to Achieve Goals: Fair ADL Goals Pt Will Perform Grooming: sitting;with set-up Pt Will Perform Upper Body Bathing: with min assist;sitting Pt Will Perform Upper Body Dressing: with min  assist;sitting Additional ADL Goal #1: Pt will perform bed mobility with moderate assistance in preparation for ADLs. Additional ADL Goal #2: Pt will stand with moderate assistance as a precursor to ADLs.  OT Frequency: Min 2X/week    Co-evaluation              AM-PAC OT "6 Clicks" Daily Activity     Outcome Measure Help from another person eating meals?: A Little Help from another person taking care of personal grooming?: A Little Help from another person toileting, which includes using toliet, bedpan, or urinal?: Total Help from another person bathing (including washing, rinsing, drying)?: A Lot Help from another person to put on and taking off regular upper body clothing?: Total Help from another person to put on and taking off regular lower body clothing?: Total 6 Click Score: 11   End of Session Equipment Utilized During Treatment: Rolling walker (2 wheels);Gait belt Nurse Communication: Mobility status  Activity Tolerance: Patient tolerated treatment well Patient left: in bed;with call bell/phone within reach;with bed alarm set  OT Visit Diagnosis: Muscle weakness (generalized) (M62.81);Other symptoms and signs involving cognitive function                Time: 1209-1224 OT Time Calculation (min): 15 min Charges:  OT  General Charges $OT Visit: 1 Visit OT Evaluation $OT Eval Moderate Complexity: [place], OTR/L Acute Rehabilitation Services Office: [phone]   Malka So 08/13/2022, 12:48 PM

## 2022-08-13 NOTE — Progress Notes (Signed)
  Transition of Care [facility]) Screening Note   Patient Details  Name: Brianna [name] Date of Birth: [birth year]   Transition of Care [facility]) CM/SW Contact:    Kimber Relic, LCSW Phone Number: 08/13/2022, 1:33 PM    Transition of Care Department [facility]) has reviewed patient and no TOC needs have been identified at this time. We will continue to monitor patient advancement through interdisciplinary progression rounds. If new patient transition needs arise, please place a TOC consult.

## 2022-08-13 NOTE — Progress Notes (Signed)
PROGRESS NOTE  Brianna [name] YBO:[id number] DOB: 1933-06-05   PCP: [name], MD  Patient is from: Home.  Reports living with son.  Reports ambulating independently at baseline.  DOA: 08/11/2022 LOS: 1  Chief complaints Chief Complaint  Patient presents with   Possible UTI     Brief Narrative / Interim history: 88 year old F with PMH of dementia, HTN, hypothyroidism, MGUS and recurrent UTI presenting with confusion and fatigue, and admitted for sepsis due to UTI and AKI.[name]  Patient was febrile to 100.9 with some leukocytosis.  UA concerning for UTI.  Take acid 2.1.  Last Pro-Cal 4.83.  TSH elevated to 66.  Cultures and thyroid panel ordered.  Started on IV fluid and IV antibiotics.   GI bleed with staph species, not Staph aureus.  Urine culture with pansensitive E. coli.  Antibiotic de-escalated to p.o. cefadroxil.  Subjective: Seen and examined earlier this morning.  No major events overnight of this morning.  Resting in bed with eyes closed.  She is awake.  No complaints.  She denies chest pain, shortness of breath, nausea, vomiting, abdominal pain or UTI symptoms.  He is oriented to self, place and person but not time.  Objective: Vitals:   08/13/22 0350 08/13/22 0800 08/13/22 1100 08/13/22 1340  BP: (!) 120/57 120/63  (!) 113/58  Pulse: (!) 57 (!) 57  73  Resp: '16 18  18  '$ Temp: 99.1 F (37.3 C) 98.3 F (36.8 C)  98.6 F (37 C)  TempSrc: Oral   Oral  SpO2: 98% 100%  98%  Weight:   73.8 kg   Height:   '5\' 6"'$  (1.676 m)     Examination:  GENERAL: Appears frail.  No apparent distress. HEENT: MMM.  Vision and hearing grossly intact.  NECK: Supple.  No apparent JVD.  RESP:  No IWOB.  Fair aeration bilaterally. CVS:  RRR. Heart sounds normal.  ABD/GI/GU: BS+. Abd soft, NTND.  MSK/EXT:  Moves extremities. No apparent deformity.  Trace ankle edema, right> left. SKIN: no apparent skin lesion or wound NEURO: Awake. Oriented to self, place and person.  No apparent  focal neuro deficit. PSYCH: Calm. Normal affect.   Procedures:  None  Microbiology summarized: Blood cultures with GPC in clusters in 1 out of 2 aerobic bottles.  BC ID with staph species but not aureus. Urine culture with pansensitive E. coli  Assessment and plan: Principal Problem:   Severe sepsis (HCC) Active Problems:   MGUS (monoclonal gammopathy of unknown significance)   Essential hypertension   Dementia (HCC)   Hypothyroidism   Chronic renal disease, stage II   AKI (acute kidney injury) ([place])   Hypokalemia   Goals of care, counseling/discussion   Cystitis   Palliative care encounter   Severe sepsis due to urinary tract infection: Febrile to 100.9 with leukocytosis, lactic acidosis, AKI and altered mental status.  UA concerning for UTI.  She did denies UTI symptoms but patient is not a reliable historian due to dementia.  Blood and urine cultures as above.  Lactic acidosis resolved.  Hemodynamically stable. -IV CTX 10/26-10/27>> p.o. cefadroxil 10/28>> for a total of 5 days  Confusion/dementia without behavioral disturbance: Oriented to self, place and person but not time.  Likely her baseline.  No focal neurodeficits.  CT head without acute finding.   -Treat UTI and hypothyroidism -Treat treatable causes -Reorientation and delirium precaution  Hypothyroidism/markedly elevated TSH: TSH 66.  Free T4<0.25.  Has not been taking her meds. -Continue home Synthroid at 125  mcg daily -Follow total T3  AKI/azotemia: Improving. Recent Labs    09/02/21 1635 12/23/21 1629 08/11/22 1700 08/11/22 2310 08/12/22 0343 08/13/22 0530  BUN 23 14 30* 30* 26* 20  CREATININE 1.17* 1.03* 1.88* 1.76* 1.56* 1.32*  -Continue IV fluid  Normocytic anemia: Likely anemia of renal disease Recent Labs    09/02/21 1635 08/11/22 1700 08/11/22 2310 08/13/22 0530  HGB 13.0 12.3 10.6* 10.1*  -Check anemia panel -Monitor H&H  Generalized weakness: Multifactorial.  Reports ambulating  independently at baseline. -PT/OT-recommended SNF.  Lactic acidosis: Resolved.  Mild thrombocytopenia: Likely reactive. -Monitor  Hypokalemia/hypophosphatemia: -Monitor replenish as appropriate  Goal of care counseling: See goals of care discussion on 10/27.  Remains full code. -Appreciate input by palliative medicine  Body mass index is 26.26 kg/m.           DVT prophylaxis:  heparin injection 5,000 Units Start: 08/12/22 1415  Code Status: Full code Family Communication: None at bedside. Level of care: Telemetry Status is: Inpatient The patient will remain inpatient because: Urinary tract infection, AKI, electrolyte derangements and SNF bed   Final disposition: SNF Consultants:  Palliative medicine  Sch Meds:  Scheduled Meds:  aspirin EC  81 mg Oral Daily   cefadroxil  500 mg Oral BID   heparin injection (subcutaneous)  5,000 Units Subcutaneous Q8H   levothyroxine  125 mcg Oral Q0600   multivitamin with minerals  1 tablet Oral Daily   Continuous Infusions:  lactated ringers with KCl 20 mEq/L     potassium PHOSPHATE IVPB (in mmol) 30 mmol (08/13/22 1014)   PRN Meds:.  Antimicrobials: Anti-infectives (From admission, onward)    Start     Dose/Rate Route Frequency Ordered Stop   08/13/22 2200  cefadroxil (DURICEF) capsule 500 mg        500 mg Oral 2 times daily 08/13/22 1244 08/16/22 2159   08/12/22 2200  cefTRIAXone (ROCEPHIN) 1 g in sodium chloride 0.9 % 100 mL IVPB  Status:  Discontinued        1 g 200 mL/hr over 30 Minutes Intravenous Every 24 hours 08/11/22 2207 08/13/22 1244   08/11/22 2130  cefTRIAXone (ROCEPHIN) 1 g in sodium chloride 0.9 % 100 mL IVPB        1 g 200 mL/hr over 30 Minutes Intravenous  Once 08/11/22 2122 08/11/22 2230        I have personally reviewed the following labs and images: CBC: Recent Labs  Lab 08/11/22 1700 08/11/22 2310 08/13/22 0530  WBC 11.8* 8.1 5.6  NEUTROABS 10.6* 6.5  --   HGB 12.3 10.6* 10.1*  HCT  37.6 32.6* 30.8*  MCV 96.2 97.0 94.2  PLT 162 130* 135*   BMP &GFR Recent Labs  Lab 08/11/22 1700 08/11/22 2310 08/12/22 0343 08/13/22 0530  NA 139 136 137 136  K 3.0* 2.7* 3.1* 3.2*  CL 98 97* 101 100  CO2 '27 28 26 26  '$ GLUCOSE 137* 122* 121* 97  BUN 30* 30* 26* 20  CREATININE 1.88* 1.76* 1.56* 1.32*  CALCIUM 8.4* 7.4* 7.5* 7.7*  MG  --  1.8  --  2.0  PHOS  --  2.3*  --  1.6*   Estimated Creatinine Clearance: 30.9 mL/min (A) (by C-G formula based on SCr of 1.32 mg/dL (H)). Liver & Pancreas: Recent Labs  Lab 08/11/22 1700 08/11/22 2310 08/12/22 0343 08/13/22 0530  AST 32 24 22 41  ALT '20 16 16 24  '$ ALKPHOS 60 50 52 52  BILITOT 1.4* 1.0  1.0 0.6  PROT 8.3* 7.0 6.7 6.3*  ALBUMIN 3.7 3.0* 2.9* 2.5*   Recent Labs  Lab 08/11/22 1700  LIPASE 33   Recent Labs  Lab 08/11/22 1700  AMMONIA 18   Diabetic: No results for input(s): "HGBA1C" in the last 72 hours. No results for input(s): "GLUCAP" in the last 168 hours. Cardiac Enzymes: Recent Labs  Lab 08/11/22 2310 08/13/22 0530  CKTOTAL 199 385*   No results for input(s): "PROBNP" in the last 8760 hours. Coagulation Profile: Recent Labs  Lab 08/11/22 2310  INR 1.2   Thyroid Function Tests: Recent Labs    08/11/22 1700 08/13/22 0530  TSH 65.626* 56.094*  FREET4 <0.25*  --    Lipid Profile: No results for input(s): "CHOL", "HDL", "LDLCALC", "TRIG", "CHOLHDL", "LDLDIRECT" in the last 72 hours. Anemia Panel: Recent Labs    08/11/22 2310  VITAMINB12 223  FOLATE 5.1*  FERRITIN 466*  TIBC 184*  IRON 23*  RETICCTPCT 1.0   Urine analysis:    Component Value Date/Time   COLORURINE YELLOW 08/11/2022 2023   APPEARANCEUR CLEAR 08/11/2022 2023   LABSPEC 1.015 08/11/2022 2023   PHURINE 8.0 08/11/2022 2023   GLUCOSEU NEGATIVE 08/11/2022 2023   HGBUR LARGE (A) 08/11/2022 2023   BILIRUBINUR NEGATIVE 08/11/2022 2023   BILIRUBINUR small 12/23/2021 [place] 08/11/2022 2023   PROTEINUR 100  (A) 08/11/2022 2023   UROBILINOGEN 1.0 12/23/2021 1634   UROBILINOGEN 0.2 03/10/2015 2228   NITRITE NEGATIVE 08/11/2022 2023   LEUKOCYTESUR MODERATE (A) 08/11/2022 2023   Sepsis Labs: Invalid input(s): "PROCALCITONIN", "LACTICIDVEN"  Microbiology: Recent Results (from the past 240 hour(s))  Urine Culture     Status: Abnormal   Collection Time: 08/11/22 10:17 PM   Specimen: Urine, Catheterized  Result Value Ref Range Status   Specimen Description   Final    URINE, CATHETERIZED Performed at [place] [address], [place], [place] [address]    Special Requests   Final    NONE Performed at [facility], [place] [address] [place], [place] [address]    Culture >=100,000 COLONIES/mL ESCHERICHIA COLI (A)  Final   Report Status 08/13/2022 FINAL  Final   Organism ID, Bacteria ESCHERICHIA COLI (A)  Final      Susceptibility   Escherichia coli - MIC*    AMPICILLIN <=2 SENSITIVE Sensitive     CEFAZOLIN <=4 SENSITIVE Sensitive     CEFEPIME <=0.12 SENSITIVE Sensitive     CEFTRIAXONE <=0.25 SENSITIVE Sensitive     CIPROFLOXACIN <=0.25 SENSITIVE Sensitive     GENTAMICIN <=1 SENSITIVE Sensitive     IMIPENEM <=0.25 SENSITIVE Sensitive     NITROFURANTOIN <=16 SENSITIVE Sensitive     TRIMETH/SULFA <=20 SENSITIVE Sensitive     AMPICILLIN/SULBACTAM <=2 SENSITIVE Sensitive     PIP/TAZO <=4 SENSITIVE Sensitive     * >=100,000 COLONIES/mL ESCHERICHIA COLI  Culture, blood (x 2)     Status: None (Preliminary result)   Collection Time: 08/11/22 10:56 PM   Specimen: BLOOD  Result Value Ref Range Status   Specimen Description   Final    BLOOD LEFT ANTECUBITAL Performed at [place] [address] [place], [place] [address]    Special Requests   Final    BOTTLES DRAWN AEROBIC AND ANAEROBIC Blood Culture adequate volume Performed at [place] [address] [place], [place] [address]    Culture  Setup Time    Final    GRAM POSITIVE  COCCI IN CLUSTERS AEROBIC BOTTLE ONLY CRITICAL RESULT CALLED TO, READ BACK BY AND VERIFIED WITH: [place] [phone] FCP Performed at [facility], [place] [address], [place], [place] [address]    Culture GRAM POSITIVE COCCI  Final   Report Status PENDING  Incomplete  Blood Culture ID Panel (Reflexed)     Status: Abnormal   Collection Time: 08/11/22 10:56 PM  Result Value Ref Range Status   Enterococcus faecalis NOT DETECTED NOT DETECTED Final   Enterococcus Faecium NOT DETECTED NOT DETECTED Final   Listeria monocytogenes NOT DETECTED NOT DETECTED Final   Staphylococcus species DETECTED (A) NOT DETECTED Final    Comment: CRITICAL RESULT CALLED TO, READ BACK BY AND VERIFIED WITH: PHARMD J. GADHIA [phone] FCP    Staphylococcus aureus (BCID) NOT DETECTED NOT DETECTED Final   Staphylococcus epidermidis NOT DETECTED NOT DETECTED Final   Staphylococcus lugdunensis NOT DETECTED NOT DETECTED Final   Streptococcus species NOT DETECTED NOT DETECTED Final   Streptococcus agalactiae NOT DETECTED NOT DETECTED Final   Streptococcus pneumoniae NOT DETECTED NOT DETECTED Final   Streptococcus pyogenes NOT DETECTED NOT DETECTED Final   A.calcoaceticus-baumannii NOT DETECTED NOT DETECTED Final   Bacteroides fragilis NOT DETECTED NOT DETECTED Final   Enterobacterales NOT DETECTED NOT DETECTED Final   Enterobacter cloacae complex NOT DETECTED NOT DETECTED Final   Escherichia coli NOT DETECTED NOT DETECTED Final   Klebsiella aerogenes NOT DETECTED NOT DETECTED Final   Klebsiella oxytoca NOT DETECTED NOT DETECTED Final   Klebsiella pneumoniae NOT DETECTED NOT DETECTED Final   Proteus species NOT DETECTED NOT DETECTED Final   Salmonella species NOT DETECTED NOT DETECTED Final   Serratia marcescens NOT DETECTED NOT DETECTED Final   Haemophilus influenzae NOT DETECTED NOT DETECTED Final   Neisseria meningitidis NOT DETECTED NOT DETECTED Final   Pseudomonas  aeruginosa NOT DETECTED NOT DETECTED Final   Stenotrophomonas maltophilia NOT DETECTED NOT DETECTED Final   Candida albicans NOT DETECTED NOT DETECTED Final   Candida auris NOT DETECTED NOT DETECTED Final   Candida glabrata NOT DETECTED NOT DETECTED Final   Candida krusei NOT DETECTED NOT DETECTED Final   Candida parapsilosis NOT DETECTED NOT DETECTED Final   Candida tropicalis NOT DETECTED NOT DETECTED Final   Cryptococcus neoformans/gattii NOT DETECTED NOT DETECTED Final    Comment: Performed at [facility], [address] [place], [place] [address]  Culture, blood (x 2)     Status: None (Preliminary result)   Collection Time: 08/11/22 11:10 PM   Specimen: BLOOD  Result Value Ref Range Status   Specimen Description   Final    BLOOD BLOOD LEFT HAND Performed at [place] [address] [place], [place] [address]    Special Requests   Final    BOTTLES DRAWN AEROBIC AND ANAEROBIC Blood Culture results may not be optimal due to an inadequate volume of blood received in culture bottles Performed at [place] [address] [place], [place] [address]    Culture   Final    NO GROWTH 1 DAY Performed at [facility], [place] [address], [place], [place] [address]    Report Status PENDING  Incomplete    Radiology Studies: No results found.    Jared Cahn T. [place]  If 7PM-7AM, please contact night-coverage [email or web address] 08/13/2022, 4:01 PM

## 2022-08-13 NOTE — Progress Notes (Signed)
I attest to student documentation.  Divon Krabill, MSN-RN Nursing Adjunct Faculty [employer] 

## 2022-08-14 DIAGNOSIS — E039 Hypothyroidism, unspecified: Secondary | ICD-10-CM | POA: Diagnosis not present

## 2022-08-14 DIAGNOSIS — E876 Hypokalemia: Secondary | ICD-10-CM | POA: Diagnosis not present

## 2022-08-14 DIAGNOSIS — Z7189 Other specified counseling: Secondary | ICD-10-CM | POA: Diagnosis not present

## 2022-08-14 DIAGNOSIS — A419 Sepsis, unspecified organism: Secondary | ICD-10-CM | POA: Diagnosis not present

## 2022-08-14 LAB — MAGNESIUM: Magnesium: 1.7 mg/dL (ref 1.7–2.4)

## 2022-08-14 LAB — RENAL FUNCTION PANEL
Albumin: 2.7 g/dL — ABNORMAL LOW (ref 3.5–5.0)
Anion gap: 6 (ref 5–15)
BUN: 15 mg/dL (ref 8–23)
CO2: 25 mmol/L (ref 22–32)
Calcium: 7.6 mg/dL — ABNORMAL LOW (ref 8.9–10.3)
Chloride: 100 mmol/L (ref 98–111)
Creatinine, Ser: 1.25 mg/dL — ABNORMAL HIGH (ref 0.44–1.00)
GFR, Estimated: 42 mL/min — ABNORMAL LOW (ref >60)
Glucose, Bld: 103 mg/dL — ABNORMAL HIGH (ref 70–99)
Phosphorus: 2.1 mg/dL — ABNORMAL LOW (ref 2.5–4.6)
Potassium: 3.8 mmol/L (ref 3.5–5.1)
Sodium: 131 mmol/L — ABNORMAL LOW (ref 135–145)

## 2022-08-14 LAB — CBC
HCT: 33 % — ABNORMAL LOW (ref 36.0–46.0)
Hemoglobin: 10.9 g/dL — ABNORMAL LOW (ref 12.0–15.0)
MCH: 30.4 pg (ref 26.0–34.0)
MCHC: 33 g/dL (ref 30.0–36.0)
MCV: 92.2 fL (ref 80.0–100.0)
Platelets: 154 10*3/uL (ref 150–400)
RBC: 3.58 MIL/uL — ABNORMAL LOW (ref 3.87–5.11)
RDW: 15.1 % (ref 11.5–15.5)
WBC: 6.8 10*3/uL (ref 4.0–10.5)
nRBC: 0 % (ref 0.0–0.2)

## 2022-08-14 LAB — T3: T3, Total: <20 ng/dL — ABNORMAL LOW (ref 71–180)

## 2022-08-14 LAB — CK: Total CK: 341 U/L — ABNORMAL HIGH (ref 38–234)

## 2022-08-14 MED ORDER — BOOST / RESOURCE BREEZE PO LIQD CUSTOM
1.0000 | ORAL | Status: DC
  Administered 2022-08-14 – 2022-08-16 (×3): 1 via ORAL

## 2022-08-14 MED ORDER — ENSURE ENLIVE PO LIQD
237.0000 mL | Freq: Two times a day (BID) | ORAL | Status: DC
  Administered 2022-08-14 – 2022-08-16 (×6): 237 mL via ORAL

## 2022-08-14 NOTE — Progress Notes (Signed)
Initial Nutrition Assessment RD working remotely.   DOCUMENTATION CODES:   Not applicable  INTERVENTION:  - ordered Boost Breeze once/day, each supplement provides 250 kcal and 9 grams of protein.  - ordered Ensure Plus High Protein BID, each supplement provides 350 kcal and 20 grams of protein.  - complete NFPE when feasible.   NUTRITION DIAGNOSIS:   Increased nutrient needs related to acute illness as evidenced by estimated needs.  GOAL:   Patient will meet greater than or equal to 90% of their needs  MONITOR:   PO intake, Supplement acceptance, Labs, Weight trends  REASON FOR ASSESSMENT:   Malnutrition Screening Tool, Consult Assessment of nutrition requirement/status  ASSESSMENT:   88 year old female with medical history of dementia, HTN, hypothyroidism, MGUS and recurrent UTI presenting with confusion and fatigue. She was admitted for sepsis d/t UTI, AKI, and GIB. She was started on IV fluid and IV abx.  MST score of 2.0 as patient was noted to have been unable to answer d/t confusion. Now noted to be a/o x3.   No meal intakes documented this admission. She has not been seen by a [place] RD at any time in the past.  Weight yesterday was 163 lb and weight has been stable since 04/23/20. No information documented in the edema section of flow sheet.   Patient is followed by Palliative Care. She is Full Code.   PT/OT recommending SNF at d/c.   Labs reviewed; Na: 131 mmol/l, creatinine: 1.25 mg/dl, Ca: 7.6 mg/dl, GFR: 42 ml/min.  Medications reviewed; 125 mcg oral synthroid/day, 1 tablet multivitamin with minerals/day, 40 mEq Klor-Con x2 doses 10/28, 30 mmol IV KPhos x1 run 10/28.    NUTRITION - FOCUSED PHYSICAL EXAM:  RD working remotely.  Diet Order:   Diet Order             Diet regular Room service appropriate? Yes; Fluid consistency: Thin  Diet effective now                   EDUCATION NEEDS:   No education needs have been identified at  this time  Skin:  Skin Assessment: Reviewed RN Assessment  Last BM:  10/26 (type 6, smear)  Height:   Ht Readings from Last 1 Encounters:  08/13/22 '5\' 6"'$  (1.676 m)    Weight:   Wt Readings from Last 1 Encounters:  08/13/22 73.8 kg     BMI:  Body mass index is 26.26 kg/m.  Estimated Nutritional Needs:  Kcal:  1700-1900 kcal Protein:  80-90 grams Fluid:  >/= 2 L/day     Jarome Matin, MS, RD, LDN, CNSC Clinical Dietitian PRN/Relief staff On-call/weekend pager # available in [facility]

## 2022-08-14 NOTE — Progress Notes (Signed)
PROGRESS NOTE  Brianna [name] TZG:[id number] DOB: [birth year]   PCP: [name], MD  Patient is from: Home.  Reports living with son.  Independently ambulates at baseline but walks slowly  DOA: 08/11/2022 LOS: 2  Chief complaints Chief Complaint  Patient presents with   Possible UTI     Brief Narrative / Interim history: 88 year old F with PMH of dementia, HTN, hypothyroidism, MGUS and recurrent UTI presenting with confusion and fatigue, and admitted for sepsis due to UTI and AKI. Patient was febrile to 100.9 with some leukocytosis.  UA concerning for UTI.  Take acid 2.1.  Last Pro-Cal 4.83.  TSH elevated to 66.  Cultures and thyroid panel ordered.  Started on IV fluid and IV antibiotics.   GI bleed with staph species, not Staph aureus.  Urine culture with pansensitive E. coli.  Antibiotic de-escalated to p.o. cefadroxil.  Therapy recommended SNF.  Subjective: Seen and examined earlier this morning.  No major events overnight of this morning.  No complaints not a great historian.  She denies pain, shortness of breath, nausea, vomiting or UTI symptoms.  Objective: Vitals:   08/13/22 1100 08/13/22 1340 08/13/22 2007 08/14/22 0555  BP:  (!) 113/58 (!) 147/75 (!) 141/68  Pulse:  73 63 64  Resp:  '18 18 18  '$ Temp:  98.6 F (37 C) 99.2 F (37.3 C) 99.3 F (37.4 C)  TempSrc:  Oral Oral Oral  SpO2:  98% 99% 98%  Weight: 73.8 kg     Height: '5\' 6"'$  (1.676 m)       Examination:  GENERAL: Appears frail.  No distress. HEENT: MMM.  Vision and hearing grossly intact.  NECK: Supple.  No apparent JVD.  RESP:  No IWOB.  Fair aeration bilaterally. CVS:  RRR. Heart sounds normal.  ABD/GI/GU: BS+. Abd soft, NTND.  MSK/EXT: Somewhat limited range of motion in her legs.  No apparent deformity. SKIN: no apparent skin lesion or wound NEURO: Awake and alert. Oriented to self, place and person.  Follows commands.  No apparent focal neuro deficit. PSYCH: Calm. Normal affect.   Procedures:   None  Microbiology summarized: Blood cultures with GPC in clusters in 1 out of 2 aerobic bottles.  BC ID with staph species but not aureus. Urine culture with pansensitive E. coli  Assessment and plan: Principal Problem:   Severe sepsis (HCC) Active Problems:   MGUS (monoclonal gammopathy of unknown significance)   Essential hypertension   Dementia (HCC)   Hypothyroidism   Chronic renal disease, stage II   AKI (acute kidney injury) ([place])   Hypokalemia   Goals of care, counseling/discussion   Cystitis   Palliative care encounter   Severe sepsis due to urinary tract infection: Febrile to 100.9 with leukocytosis, lactic acidosis, AKI and altered mental status.  UA concerning for UTI.  She did denies UTI symptoms but patient is not a reliable historian due to dementia.  Blood and urine cultures as above.  Lactic acidosis resolved.  Hemodynamically stable. -IV CTX 10/26-10/27>> p.o. cefadroxil 10/28>> for a total of 5 days  Confusion/dementia without behavioral disturbance: Oriented to self, place and person but not time.  Likely her baseline.  No focal neurodeficits.  CT head without acute finding.   -Treat UTI and hypothyroidism -Treat treatable causes -Reorientation and delirium precaution  Hypothyroidism/markedly elevated TSH: TSH 66.  Free T4<0.25.  Has not been taking her meds. -Continue home Synthroid at 125 mcg daily -Follow total T3  AKI/azotemia: Improving. Recent Labs    09/02/21  1635 12/23/21 1629 08/11/22 1700 08/11/22 2310 08/12/22 0343 08/13/22 0530 08/14/22 0530  BUN 23 14 30* 30* 26* 20 15  CREATININE 1.17* 1.03* 1.88* 1.76* 1.56* 1.32* 1.25*  -Continue IV fluid.  Poor p.o. intake.  Normocytic anemia: Likely anemia of renal disease Recent Labs    09/02/21 1635 08/11/22 1700 08/11/22 2310 08/13/22 0530 08/14/22 0530  HGB 13.0 12.3 10.6* 10.1* 10.9*  -Check anemia panel -Monitor H&H  Generalized weakness: Multifactorial.  Reports ambulating  independently at baseline.  Confirmed with patient's brother over the phone. -OOB -PT/OT-recommended SNF.  Lactic acidosis: Resolved.  Mild thrombocytopenia: Likely reactive. -Monitor  Hypokalemia/hypophosphatemia/hyponatremia: -Monitor replenish as appropriate  Goal of care counseling: See goals of care discussion on 10/27.  Remains full code. -Appreciate input by palliative medicine  Increased nutrient needs Body mass index is 26.26 kg/m. Nutrition Problem: Increased nutrient needs Etiology: acute illness Signs/Symptoms: estimated needs Interventions: Boost Breeze, Ensure Enlive (each supplement provides 350kcal and 20 grams of protein)   DVT prophylaxis:  heparin injection 5,000 Units Start: 08/12/22 1415  Code Status: Full code Family Communication: Updated patient's brother over the phone. Level of care: Telemetry Status is: Inpatient The patient will remain inpatient because: UTI and safe disposition   Final disposition: SNF Consultants:  Palliative medicine  Sch Meds:  Scheduled Meds:  aspirin EC  81 mg Oral Daily   cefadroxil  500 mg Oral BID   feeding supplement  1 Container Oral Q24H   feeding supplement  237 mL Oral BID BM   heparin injection (subcutaneous)  5,000 Units Subcutaneous Q8H   levothyroxine  125 mcg Oral Q0600   multivitamin with minerals  1 tablet Oral Daily   Continuous Infusions:  lactated ringers 1,000 mL with potassium chloride 20 mEq infusion 75 mL/hr at 08/13/22 1832   PRN Meds:.  Antimicrobials: Anti-infectives (From admission, onward)    Start     Dose/Rate Route Frequency Ordered Stop   08/13/22 2200  cefadroxil (DURICEF) capsule 500 mg        500 mg Oral 2 times daily 08/13/22 1244 08/16/22 2159   08/12/22 2200  cefTRIAXone (ROCEPHIN) 1 g in sodium chloride 0.9 % 100 mL IVPB  Status:  Discontinued        1 g 200 mL/hr over 30 Minutes Intravenous Every 24 hours 08/11/22 2207 08/13/22 1244   08/11/22 2130  cefTRIAXone  (ROCEPHIN) 1 g in sodium chloride 0.9 % 100 mL IVPB        1 g 200 mL/hr over 30 Minutes Intravenous  Once 08/11/22 2122 08/11/22 2230        I have personally reviewed the following labs and images: CBC: Recent Labs  Lab 08/11/22 1700 08/11/22 2310 08/13/22 0530 08/14/22 0530  WBC 11.8* 8.1 5.6 6.8  NEUTROABS 10.6* 6.5  --   --   HGB 12.3 10.6* 10.1* 10.9*  HCT 37.6 32.6* 30.8* 33.0*  MCV 96.2 97.0 94.2 92.2  PLT 162 130* 135* 154   BMP &GFR Recent Labs  Lab 08/11/22 1700 08/11/22 2310 08/12/22 0343 08/13/22 0530 08/14/22 0530  NA 139 136 137 136 131*  K 3.0* 2.7* 3.1* 3.2* 3.8  CL 98 97* 101 100 100  CO2 '27 28 26 26 25  '$ GLUCOSE 137* 122* 121* 97 103*  BUN 30* 30* 26* 20 15  CREATININE 1.88* 1.76* 1.56* 1.32* 1.25*  CALCIUM 8.4* 7.4* 7.5* 7.7* 7.6*  MG  --  1.8  --  2.0 1.7  PHOS  --  2.3*  --  1.6* 2.1*   Estimated Creatinine Clearance: 32.6 mL/min (A) (by C-G formula based on SCr of 1.25 mg/dL (H)). Liver & Pancreas: Recent Labs  Lab 08/11/22 1700 08/11/22 2310 08/12/22 0343 08/13/22 0530 08/14/22 0530  AST 32 24 22 41  --   ALT '20 16 16 24  '$ --   ALKPHOS 60 50 52 52  --   BILITOT 1.4* 1.0 1.0 0.6  --   PROT 8.3* 7.0 6.7 6.3*  --   ALBUMIN 3.7 3.0* 2.9* 2.5* 2.7*   Recent Labs  Lab 08/11/22 1700  LIPASE 33   Recent Labs  Lab 08/11/22 1700  AMMONIA 18   Diabetic: No results for input(s): "HGBA1C" in the last 72 hours. No results for input(s): "GLUCAP" in the last 168 hours. Cardiac Enzymes: Recent Labs  Lab 08/11/22 2310 08/13/22 0530 08/14/22 0530  CKTOTAL 199 385* 341*   No results for input(s): "PROBNP" in the last 8760 hours. Coagulation Profile: Recent Labs  Lab 08/11/22 2310  INR 1.2   Thyroid Function Tests: Recent Labs    08/11/22 1700 08/13/22 0530  TSH 65.626* 56.094*  FREET4 <0.25*  --    Lipid Profile: No results for input(s): "CHOL", "HDL", "LDLCALC", "TRIG", "CHOLHDL", "LDLDIRECT" in the last 72  hours. Anemia Panel: Recent Labs    08/11/22 2310  VITAMINB12 223  FOLATE 5.1*  FERRITIN 466*  TIBC 184*  IRON 23*  RETICCTPCT 1.0   Urine analysis:    Component Value Date/Time   COLORURINE YELLOW 08/11/2022 2023   APPEARANCEUR CLEAR 08/11/2022 2023   LABSPEC 1.015 08/11/2022 2023   PHURINE 8.0 08/11/2022 2023   GLUCOSEU NEGATIVE 08/11/2022 2023   HGBUR LARGE (A) 08/11/2022 2023   BILIRUBINUR NEGATIVE 08/11/2022 2023   BILIRUBINUR small 12/23/2021 [place] 08/11/2022 2023   PROTEINUR 100 (A) 08/11/2022 2023   UROBILINOGEN 1.0 12/23/2021 1634   UROBILINOGEN 0.2 03/10/2015 2228   NITRITE NEGATIVE 08/11/2022 2023   LEUKOCYTESUR MODERATE (A) 08/11/2022 2023   Sepsis Labs: Invalid input(s): "PROCALCITONIN", "LACTICIDVEN"  Microbiology: Recent Results (from the past 240 hour(s))  Urine Culture     Status: Abnormal   Collection Time: 08/11/22 10:17 PM   Specimen: Urine, Catheterized  Result Value Ref Range Status   Specimen Description   Final    URINE, CATHETERIZED Performed at [facility] [address] [place], [place] [address]    Special Requests   Final    NONE Performed at [facility], [place] [address] [place], [place] [address]    Culture >=100,000 COLONIES/mL ESCHERICHIA COLI (A)  Final   Report Status 08/13/2022 FINAL  Final   Organism ID, Bacteria ESCHERICHIA COLI (A)  Final      Susceptibility   Escherichia coli - MIC*    AMPICILLIN <=2 SENSITIVE Sensitive     CEFAZOLIN <=4 SENSITIVE Sensitive     CEFEPIME <=0.12 SENSITIVE Sensitive     CEFTRIAXONE <=0.25 SENSITIVE Sensitive     CIPROFLOXACIN <=0.25 SENSITIVE Sensitive     GENTAMICIN <=1 SENSITIVE Sensitive     IMIPENEM <=0.25 SENSITIVE Sensitive     NITROFURANTOIN <=16 SENSITIVE Sensitive     TRIMETH/SULFA <=20 SENSITIVE Sensitive     AMPICILLIN/SULBACTAM <=2 SENSITIVE Sensitive     PIP/TAZO <=4 SENSITIVE Sensitive     * >=100,000  COLONIES/mL ESCHERICHIA COLI  Culture, blood (x 2)     Status: None (Preliminary result)   Collection Time: 08/11/22 10:56 PM  Specimen: BLOOD  Result Value Ref Range Status   Specimen Description   Final    BLOOD LEFT ANTECUBITAL Performed at [facility] [address] [place], [place] [address]    Special Requests   Final    BOTTLES DRAWN AEROBIC AND ANAEROBIC Blood Culture adequate volume Performed at [facility] [address] [place], [place] [address]    Culture  Setup Time   Final    GRAM POSITIVE COCCI IN CLUSTERS AEROBIC BOTTLE ONLY CRITICAL RESULT CALLED TO, READ BACK BY AND VERIFIED WITH: Crane [id number] FCP Performed at [facility], [place] [address] [place], [place] [address]    Culture GRAM POSITIVE COCCI  Final   Report Status PENDING  Incomplete  Blood Culture ID Panel (Reflexed)     Status: Abnormal   Collection Time: 08/11/22 10:56 PM  Result Value Ref Range Status   Enterococcus faecalis NOT DETECTED NOT DETECTED Final   Enterococcus Faecium NOT DETECTED NOT DETECTED Final   Listeria monocytogenes NOT DETECTED NOT DETECTED Final   Staphylococcus species DETECTED (A) NOT DETECTED Final    Comment: CRITICAL RESULT CALLED TO, READ BACK BY AND VERIFIED WITH: PHARMD J. GADHIA [id number] FCP    Staphylococcus aureus (BCID) NOT DETECTED NOT DETECTED Final   Staphylococcus epidermidis NOT DETECTED NOT DETECTED Final   Staphylococcus lugdunensis NOT DETECTED NOT DETECTED Final   Streptococcus species NOT DETECTED NOT DETECTED Final   Streptococcus agalactiae NOT DETECTED NOT DETECTED Final   Streptococcus pneumoniae NOT DETECTED NOT DETECTED Final   Streptococcus pyogenes NOT DETECTED NOT DETECTED Final   A.calcoaceticus-baumannii NOT DETECTED NOT DETECTED Final   Bacteroides fragilis NOT DETECTED NOT DETECTED Final   Enterobacterales NOT DETECTED NOT DETECTED Final   Enterobacter cloacae complex NOT  DETECTED NOT DETECTED Final   Escherichia coli NOT DETECTED NOT DETECTED Final   Klebsiella aerogenes NOT DETECTED NOT DETECTED Final   Klebsiella oxytoca NOT DETECTED NOT DETECTED Final   Klebsiella pneumoniae NOT DETECTED NOT DETECTED Final   Proteus species NOT DETECTED NOT DETECTED Final   Salmonella species NOT DETECTED NOT DETECTED Final   Serratia marcescens NOT DETECTED NOT DETECTED Final   Haemophilus influenzae NOT DETECTED NOT DETECTED Final   Neisseria meningitidis NOT DETECTED NOT DETECTED Final   Pseudomonas aeruginosa NOT DETECTED NOT DETECTED Final   Stenotrophomonas maltophilia NOT DETECTED NOT DETECTED Final   Candida albicans NOT DETECTED NOT DETECTED Final   Candida auris NOT DETECTED NOT DETECTED Final   Candida glabrata NOT DETECTED NOT DETECTED Final   Candida krusei NOT DETECTED NOT DETECTED Final   Candida parapsilosis NOT DETECTED NOT DETECTED Final   Candida tropicalis NOT DETECTED NOT DETECTED Final   Cryptococcus neoformans/gattii NOT DETECTED NOT DETECTED Final    Comment: Performed at [facility], [address], [place], [place] [address]  Culture, blood (x 2)     Status: None (Preliminary result)   Collection Time: 08/11/22 11:10 PM   Specimen: BLOOD  Result Value Ref Range Status   Specimen Description   Final    BLOOD BLOOD LEFT HAND Performed at [facility] [address], [place], [place] [address]    Special Requests   Final    BOTTLES DRAWN AEROBIC AND ANAEROBIC Blood Culture results may not be optimal due to an inadequate volume of blood received in culture bottles Performed at [facility] [address], [place], [place] [address]  Culture   Final    NO GROWTH 1 DAY Performed at [facility], [place] [address] [place], [place] [address]    Report Status PENDING  Incomplete    Radiology Studies: No results found.    Lavoris Sparling [address]  If 7PM-7AM, please contact  night-coverage [email or web address] 08/14/2022, 1:04 PM

## 2022-08-15 DIAGNOSIS — A419 Sepsis, unspecified organism: Secondary | ICD-10-CM | POA: Diagnosis not present

## 2022-08-15 DIAGNOSIS — E876 Hypokalemia: Secondary | ICD-10-CM | POA: Diagnosis not present

## 2022-08-15 DIAGNOSIS — E039 Hypothyroidism, unspecified: Secondary | ICD-10-CM | POA: Diagnosis not present

## 2022-08-15 DIAGNOSIS — Z7189 Other specified counseling: Secondary | ICD-10-CM | POA: Diagnosis not present

## 2022-08-15 LAB — RENAL FUNCTION PANEL
Albumin: 2.5 g/dL — ABNORMAL LOW (ref 3.5–5.0)
Anion gap: 6 (ref 5–15)
BUN: 15 mg/dL (ref 8–23)
CO2: 27 mmol/L (ref 22–32)
Calcium: 8.2 mg/dL — ABNORMAL LOW (ref 8.9–10.3)
Chloride: 102 mmol/L (ref 98–111)
Creatinine, Ser: 1.07 mg/dL — ABNORMAL HIGH (ref 0.44–1.00)
GFR, Estimated: 50 mL/min — ABNORMAL LOW (ref >60)
Glucose, Bld: 113 mg/dL — ABNORMAL HIGH (ref 70–99)
Phosphorus: 2.5 mg/dL (ref 2.5–4.6)
Potassium: 3.8 mmol/L (ref 3.5–5.1)
Sodium: 135 mmol/L (ref 135–145)

## 2022-08-15 LAB — CBC
HCT: 31.5 % — ABNORMAL LOW (ref 36.0–46.0)
Hemoglobin: 10.4 g/dL — ABNORMAL LOW (ref 12.0–15.0)
MCH: 30.7 pg (ref 26.0–34.0)
MCHC: 33 g/dL (ref 30.0–36.0)
MCV: 92.9 fL (ref 80.0–100.0)
Platelets: 178 10*3/uL (ref 150–400)
RBC: 3.39 MIL/uL — ABNORMAL LOW (ref 3.87–5.11)
RDW: 15.1 % (ref 11.5–15.5)
WBC: 6.7 10*3/uL (ref 4.0–10.5)
nRBC: 0 % (ref 0.0–0.2)

## 2022-08-15 LAB — CULTURE, BLOOD (ROUTINE X 2): Special Requests: ADEQUATE

## 2022-08-15 LAB — MAGNESIUM: Magnesium: 1.8 mg/dL (ref 1.7–2.4)

## 2022-08-15 NOTE — NC FL2 (Signed)
[place] LEVEL OF CARE SCREENING TOOL     IDENTIFICATION  Patient Name: Brianna [name] Birthdate: 14-Jul-1933 Sex: female Admission Date (Current Location): 08/11/2022  [facility] and [place] Number:  Herbalist and Address:  [facility],  [place], [place]      Provider Number: [id number]  Attending Physician Name and Address:  Mercy Riding, MD  Relative Name and Phone Number:  Mady Haagensen [phone]    Current Level of Care: Hospital Recommended Level of Care: [place] Prior Approval Number:    Date Approved/Denied:   PASRR Number: [id number]  Discharge Plan: SNF    Current Diagnoses: Patient Active Problem List   Diagnosis Date Noted   Cystitis 08/13/2022   Palliative care encounter 08/13/2022   Goals of care, counseling/discussion 08/12/2022   Severe sepsis ([place]) 08/11/2022   AKI (acute kidney injury) ([place]) 08/11/2022   Hypokalemia 08/11/2022   Overweight with body mass index (BMI) of 27 to 27.9 in adult 02/24/2020   Hypothyroidism 02/24/2020   Chronic renal disease, stage II 02/24/2020   Hypertensive nephropathy 02/24/2020   Cellulitis and abscess of right lower extremity 07/16/2019   Edema of both lower extremities 07/16/2019   Dementia ([place]) 04/18/2019   Chronic headache 07/24/2014   Essential hypertension 12/24/2013   MGUS (monoclonal gammopathy of unknown significance) 12/10/2013    Orientation RESPIRATION BLADDER Height & Weight     Self, Situation, Place  Normal Incontinent, External catheter Weight: 162 lb 11.2 oz (73.8 kg) Height:  '5\' 6"'$  (167.6 cm)  BEHAVIORAL SYMPTOMS/MOOD NEUROLOGICAL BOWEL NUTRITION STATUS      Incontinent Diet (Regular)  AMBULATORY STATUS COMMUNICATION OF NEEDS Skin   Total Care Verbally Normal                       Personal Care Assistance Level of Assistance  Bathing, Feeding, Dressing Bathing Assistance: Maximum  assistance Feeding assistance: Limited assistance Dressing Assistance: Maximum assistance     Functional Limitations Info  Sight, Hearing, Speech Sight Info: Impaired Hearing Info: Adequate Speech Info: Adequate    SPECIAL CARE FACTORS FREQUENCY  PT (By licensed PT), OT (By licensed OT)     PT Frequency: 5x/wk OT Frequency: 5x/wk            Contractures Contractures Info: Not present    Additional Factors Info  Code Status, Allergies Code Status Info: FULL Allergies Info: Iodine, Sulfa Antibiotics           Current Medications (08/15/2022):  This is the current hospital active medication list Current Facility-Administered Medications  Medication Dose Route Frequency Provider Last Rate Last Admin   aspirin EC tablet 81 mg  81 mg Oral Daily Gonfa, Taye T, MD   81 mg at 08/15/22 0924   cefadroxil (DURICEF) capsule 500 mg  500 mg Oral BID Wendee Beavers T, MD   500 mg at 08/15/22 0924   feeding supplement (BOOST / RESOURCE BREEZE) liquid 1 Container  1 Container Oral Q24H Mercy Riding, MD   1 Container at 08/15/22 0924   feeding supplement (ENSURE ENLIVE / ENSURE PLUS) liquid 237 mL  237 mL Oral BID BM Gonfa, Taye T, MD   237 mL at 08/14/22 2029   heparin injection 5,000 Units  5,000 Units Subcutaneous Q8H Gonfa, Taye T, MD   5,000 Units at 08/15/22 0510   lactated ringers 1,000 mL with potassium chloride 20 mEq infusion   Intravenous Continuous  Mercy Riding, MD 75 mL/hr at 08/15/22 0825 New Bag at 08/15/22 0825   levothyroxine (SYNTHROID) tablet 125 mcg  125 mcg Oral Q0600 Wendee Beavers T, MD   125 mcg at 08/15/22 0510   multivitamin with minerals tablet 1 tablet  1 tablet Oral Daily Mercy Riding, MD   1 tablet at 08/15/22 3700   Oral care mouth rinse  15 mL Mouth Rinse PRN Mercy Riding, MD         Discharge Medications: Please see discharge summary for a list of discharge medications.  Relevant Imaging Results:  Relevant Lab Results:   Additional Information SSN:  [id number]  Vassie Moselle, LCSW

## 2022-08-15 NOTE — Progress Notes (Addendum)
[facility]) Hospital Liaison note:  Notified by Normajean Baxter of request for [facility] Palliative Care services. Will continue to follow for disposition.  Please call with any outpatient palliative questions or concerns.  Thank you for the opportunity to participate in this patient's care.  Thank you, Lorelee Market, LPN [facility] Liaison [phone]

## 2022-08-15 NOTE — TOC Initial Note (Signed)
Transition of Care [facility]) - Initial/Assessment Note    Patient Details  Name: Brianna [name] MRN: [id number] Date of Birth: 06-07-1933  [facility] of [facility]) CM/SW Contact:    Vassie Moselle, LCSW Phone Number: 08/15/2022, 1:07 PM  Clinical Narrative:                 Met with pt and spoke to brother via t/c and confirmed plans for SNF placement at discharge. Pt has not been to SNF in the past and they are unfamiliar with facilities. Pt and brother are also agreeable for pt to be referred to outpatient palliative care and do not have a preference for agency used.  Pt has been referred out for SNF placement and currently awaiting bed offers. A referral has been made to [facility] for outpatient palliative care services.   Expected Discharge Plan: Skilled Nursing Facility Barriers to Discharge: Continued Medical Work up, SNF Pending bed offer   Patient Goals and CMS Choice Patient states their goals for this hospitalization and ongoing recovery are:: To go to SNF and then home CMS Medicare.gov Compare Post Acute Care list provided to:: Patient Choice offered to / list presented to : Patient, Sibling  Expected Discharge Plan and Services Expected Discharge Plan: [place] In-house Referral: Hospice / Palliative Care Discharge Planning Services: CM Consult Post Acute Care Choice: [place] Living arrangements for the past 2 months: Single Family Home                 DME Arranged: N/A DME Agency: NA                  Prior Living Arrangements/Services Living arrangements for the past 2 months: Single Family Home Lives with:: Adult Children Patient language and need for interpreter reviewed:: Yes Do you feel safe going back to the place where you live?: Yes      Need for Family Participation in Patient Care: Yes (Comment) Care giver support system in place?: No (comment)   Criminal Activity/Legal Involvement Pertinent to Current  Situation/Hospitalization: No - Comment as needed  Activities of Daily Living Home Assistive Devices/Equipment: Other (Comment) (pt unable to answer) ADL Screening (condition at time of admission) Patient's cognitive ability adequate to safely complete daily activities?: No Does the patient have difficulty concentrating, remembering, or making decisions?: Yes Patient able to express need for assistance with ADLs?: No Does the patient have difficulty dressing or bathing?: Yes Independently performs ADLs?: No Communication: Independent Dressing (OT): Needs assistance Is this a change from baseline?: Pre-admission baseline Grooming: Needs assistance Is this a change from baseline?: Pre-admission baseline Feeding: Needs assistance Is this a change from baseline?: Pre-admission baseline Bathing: Needs assistance Is this a change from baseline?: Pre-admission baseline Toileting: Needs assistance Is this a change from baseline?: Pre-admission baseline In/Out Bed: Needs assistance Is this a change from baseline?: Pre-admission baseline Walks in Home: Dependent Is this a change from baseline?: Pre-admission baseline Does the patient have difficulty walking or climbing stairs?: Yes Weakness of Legs: Both Weakness of Arms/Hands: Both  Permission Sought/Granted Permission sought to share information with : Family Supports, Chartered certified accountant granted to share information with : Yes, Verbal Permission Granted  Share Information with NAME: Caroline Sauger     Permission granted to share info w Relationship: Brother  Permission granted to share info w Contact Information: [phone]  Emotional Assessment Appearance:: Appears stated age Attitude/Demeanor/Rapport: Lethargic Affect (typically observed): Calm Orientation: : Oriented to Self, Oriented  to Place, Oriented to Situation Alcohol / Substance Use: Not Applicable Psych Involvement: No (comment)  Admission  diagnosis:  Acute encephalopathy [G93.40] Cystitis [N30.90] Sepsis ([place]) [A41.9] Severe sepsis ([place]) [A41.9, R65.20] Patient Active Problem List   Diagnosis Date Noted   Cystitis 08/13/2022   Palliative care encounter 08/13/2022   Goals of care, counseling/discussion 08/12/2022   Severe sepsis ([place]) 08/11/2022   AKI (acute kidney injury) ([place]) 08/11/2022   Hypokalemia 08/11/2022   Overweight with body mass index (BMI) of 27 to 27.9 in adult 02/24/2020   Hypothyroidism 02/24/2020   Chronic renal disease, stage II 02/24/2020   Hypertensive nephropathy 02/24/2020   Cellulitis and abscess of right lower extremity 07/16/2019   Edema of both lower extremities 07/16/2019   Dementia ([place]) 04/18/2019   Chronic headache 07/24/2014   Essential hypertension 12/24/2013   MGUS (monoclonal gammopathy of unknown significance) 12/10/2013   PCP:  [name], MD Pharmacy:   [id number] [place] [place]Phone: [phone]: [address] [address] [address] [place]Phone: [phone]: [phone]    Social Determinants of Health (SDOH) Interventions    Readmission Risk Interventions    08/15/2022    1:04 PM  Readmission Risk Prevention Plan  Post Dischage Appt Complete  Medication Screening Complete  Transportation Screening Complete

## 2022-08-15 NOTE — Care Management Important Message (Signed)
Important Message  Patient Details IM Letter given Name: Brianna [name] MRN: [id number] Date of Birth: 08-27-1933   Medicare Important Message Given:  Yes     Kerin Salen 08/15/2022, 1:31 PM

## 2022-08-15 NOTE — Progress Notes (Signed)
PROGRESS NOTE  Brianna [name] ALP:[id number] DOB: 02-20-1933   PCP: [name], MD  Patient is from: Home.  Reports living with son.  Independently ambulates at baseline but walks slowly  DOA: 08/11/2022 LOS: 3  Chief complaints Chief Complaint  Patient presents with   Possible UTI     Brief Narrative / Interim history: 88 year old F with PMH of dementia, HTN, hypothyroidism, MGUS and recurrent UTI presenting with confusion and fatigue, and admitted for sepsis due to UTI and AKI. Patient was febrile to 100.9 with some leukocytosis.  UA concerning for UTI.  Take acid 2.1.  Last Pro-Cal 4.83.  TSH elevated to 66.  Cultures and thyroid panel ordered.  Started on IV fluid and IV antibiotics.   GI bleed with staph species, not Staph aureus.  Urine culture with pansensitive E. coli.  Antibiotic de-escalated to p.o. cefadroxil.  Therapy recommended SNF.  Medically optimized for discharge pending SNF bed.  Subjective: Seen and examined earlier this morning.  No major events overnight or this morning.  No complaints.  Very deconditioned.   Objective: Vitals:   08/15/22 1200 08/15/22 1300 08/15/22 1305 08/15/22 1400  BP:   (!) 136/59   Pulse:   73   Resp: '18 17 17 19  '$ Temp:   98.4 F (36.9 C)   TempSrc:   Oral   SpO2:   97%   Weight:      Height:        Examination:  GENERAL: Appears frail.  No distress. HEENT: MMM.  Vision and hearing grossly intact.  NECK: Supple.  No apparent JVD.  RESP:  No IWOB.  Fair aeration bilaterally. CVS:  RRR. Heart sounds normal.  ABD/GI/GU: BS+. Abd soft, NTND.  MSK/EXT:  Moves extremities but very weak globally.  Significant muscle mass and subcu fat loss. SKIN: no apparent skin lesion or wound NEURO: Awake.Oriented to self, place and person.  No apparent focal neuro deficit. PSYCH: Calm. Normal affect.   Procedures:  None  Microbiology summarized: Blood cultures with GPC in clusters in 1 out of 2 aerobic bottles.  BC ID with staph  species but not aureus. Urine culture with pansensitive E. coli  Assessment and plan: Principal Problem:   Severe sepsis (HCC) Active Problems:   MGUS (monoclonal gammopathy of unknown significance)   Essential hypertension   Dementia (HCC)   Hypothyroidism   Chronic renal disease, stage II   AKI (acute kidney injury) ([place])   Hypokalemia   Goals of care, counseling/discussion   Cystitis   Palliative care encounter   Severe sepsis due to urinary tract infection: Febrile to 100.9 with leukocytosis, lactic acidosis, AKI and altered mental status.  UA concerning for UTI.  She did denies UTI symptoms but patient is not a reliable historian due to dementia.  Blood and urine cultures as above.  Lactic acidosis resolved.  Hemodynamically stable. -IV CTX 10/26-10/27>> p.o. cefadroxil 10/28>> for a total of 5 days  Confusion/dementia without behavioral disturbance: Oriented to self, place and person but not time.  Likely her baseline.  No focal neurodeficits.  CT head without acute finding.   -Treat UTI and hypothyroidism -Treat treatable causes -Reorientation and delirium precaution  Hypothyroidism/markedly elevated TSH: TSH 66.  Total T3 and free T4 very low.  Not taking Synthroid at home. -Continue home Synthroid at 125 mcg daily  AKI/azotemia: Seems to have resolved. Recent Labs    09/02/21 1635 12/23/21 1629 08/11/22 1700 08/11/22 2310 08/12/22 0343 08/13/22 0530 08/14/22 0530 08/15/22 0515  BUN 23 14 30* 30* 26* '20 15 15  '$ CREATININE 1.17* 1.03* 1.88* 1.76* 1.56* 1.32* 1.25* 1.07*  -Discontinue IV fluid and monitor off IV fluid.  Normocytic anemia: Likely anemia of renal disease.  Anemia panel suggests anemia of chronic disease. Recent Labs    09/02/21 1635 08/11/22 1700 08/11/22 2310 08/13/22 0530 08/14/22 0530 08/15/22 0515  HGB 13.0 12.3 10.6* 10.1* 10.9* 10.4*  -Monitor  Generalized weakness: Per patient and family, significant rapid decline.   -OOB/PT/OT  Lactic acidosis: Resolved.  Mild thrombocytopenia: Likely reactive.  Resolved.  Hypokalemia/hypophosphatemia/hyponatremia: -Monitor replenish as appropriate  Goal of care counseling: See goals of care discussion on 10/27.  Remains full code. -Appreciate input by palliative medicine  Increased nutrient needs Body mass index is 26.26 kg/m. Nutrition Problem: Increased nutrient needs Etiology: acute illness Signs/Symptoms: estimated needs Interventions: Boost Breeze, Ensure Enlive (each supplement provides 350kcal and 20 grams of protein)   DVT prophylaxis:  heparin injection 5,000 Units Start: 08/12/22 1415  Code Status: Full code Family Communication: Updated patient's brother over the phone on 10/29. Level of care: Telemetry Status is: Inpatient The patient will remain inpatient because: SNF bed.   Final disposition: SNF Consultants:  Palliative medicine  Sch Meds:  Scheduled Meds:  aspirin EC  81 mg Oral Daily   cefadroxil  500 mg Oral BID   feeding supplement  1 Container Oral Q24H   feeding supplement  237 mL Oral BID BM   heparin injection (subcutaneous)  5,000 Units Subcutaneous Q8H   levothyroxine  125 mcg Oral Q0600   multivitamin with minerals  1 tablet Oral Daily   Continuous Infusions:   PRN Meds:.  Antimicrobials: Anti-infectives (From admission, onward)    Start     Dose/Rate Route Frequency Ordered Stop   08/13/22 2200  cefadroxil (DURICEF) capsule 500 mg        500 mg Oral 2 times daily 08/13/22 1244 08/16/22 2159   08/12/22 2200  cefTRIAXone (ROCEPHIN) 1 g in sodium chloride 0.9 % 100 mL IVPB  Status:  Discontinued        1 g 200 mL/hr over 30 Minutes Intravenous Every 24 hours 08/11/22 2207 08/13/22 1244   08/11/22 2130  cefTRIAXone (ROCEPHIN) 1 g in sodium chloride 0.9 % 100 mL IVPB        1 g 200 mL/hr over 30 Minutes Intravenous  Once 08/11/22 2122 08/11/22 2230        I have personally reviewed the following labs and  images: CBC: Recent Labs  Lab 08/11/22 1700 08/11/22 2310 08/13/22 0530 08/14/22 0530 08/15/22 0515  WBC 11.8* 8.1 5.6 6.8 6.7  NEUTROABS 10.6* 6.5  --   --   --   HGB 12.3 10.6* 10.1* 10.9* 10.4*  HCT 37.6 32.6* 30.8* 33.0* 31.5*  MCV 96.2 97.0 94.2 92.2 92.9  PLT 162 130* 135* 154 178   BMP &GFR Recent Labs  Lab 08/11/22 2310 08/12/22 0343 08/13/22 0530 08/14/22 0530 08/15/22 0515  NA 136 137 136 131* 135  K 2.7* 3.1* 3.2* 3.8 3.8  CL 97* 101 100 100 102  CO2 '28 26 26 25 27  '$ GLUCOSE 122* 121* 97 103* 113*  BUN 30* 26* '20 15 15  '$ CREATININE 1.76* 1.56* 1.32* 1.25* 1.07*  CALCIUM 7.4* 7.5* 7.7* 7.6* 8.2*  MG 1.8  --  2.0 1.7 1.8  PHOS 2.3*  --  1.6* 2.1* 2.5   Estimated Creatinine Clearance: 38.1 mL/min (A) (by C-G formula based on SCr of 1.07  mg/dL (H)). Liver & Pancreas: Recent Labs  Lab 08/11/22 1700 08/11/22 2310 08/12/22 0343 08/13/22 0530 08/14/22 0530 08/15/22 0515  AST 32 24 22 41  --   --   ALT '20 16 16 24  '$ --   --   ALKPHOS 60 50 52 52  --   --   BILITOT 1.4* 1.0 1.0 0.6  --   --   PROT 8.3* 7.0 6.7 6.3*  --   --   ALBUMIN 3.7 3.0* 2.9* 2.5* 2.7* 2.5*   Recent Labs  Lab 08/11/22 1700  LIPASE 33   Recent Labs  Lab 08/11/22 1700  AMMONIA 18   Diabetic: No results for input(s): "HGBA1C" in the last 72 hours. No results for input(s): "GLUCAP" in the last 168 hours. Cardiac Enzymes: Recent Labs  Lab 08/11/22 2310 08/13/22 0530 08/14/22 0530  CKTOTAL 199 385* 341*   No results for input(s): "PROBNP" in the last 8760 hours. Coagulation Profile: Recent Labs  Lab 08/11/22 2310  INR 1.2   Thyroid Function Tests: Recent Labs    08/13/22 0530  TSH 56.094*   Lipid Profile: No results for input(s): "CHOL", "HDL", "LDLCALC", "TRIG", "CHOLHDL", "LDLDIRECT" in the last 72 hours. Anemia Panel: No results for input(s): "VITAMINB12", "FOLATE", "FERRITIN", "TIBC", "IRON", "RETICCTPCT" in the last 72 hours.  Urine analysis:    Component  Value Date/Time   COLORURINE YELLOW 08/11/2022 2023   APPEARANCEUR CLEAR 08/11/2022 2023   LABSPEC 1.015 08/11/2022 2023   PHURINE 8.0 08/11/2022 2023   GLUCOSEU NEGATIVE 08/11/2022 2023   HGBUR LARGE (A) 08/11/2022 2023   BILIRUBINUR NEGATIVE 08/11/2022 2023   BILIRUBINUR small 12/23/2021 1634   KETONESUR NEGATIVE 08/11/2022 2023   PROTEINUR 100 (A) 08/11/2022 2023   UROBILINOGEN 1.0 12/23/2021 1634   UROBILINOGEN 0.2 03/10/2015 2228   NITRITE NEGATIVE 08/11/2022 2023   LEUKOCYTESUR MODERATE (A) 08/11/2022 2023   Sepsis Labs: Invalid input(s): "PROCALCITONIN", "LACTICIDVEN"  Microbiology: Recent Results (from the past 240 hour(s))  Urine Culture     Status: Abnormal   Collection Time: 08/11/22 10:17 PM   Specimen: Urine, Catheterized  Result Value Ref Range Status   Specimen Description   Final    URINE, CATHETERIZED Performed at [facility] [address], [place], [place] [address]    Special Requests   Final    NONE Performed at [facility], [place] [address], [place], [place] [address]    Culture >=100,000 COLONIES/mL ESCHERICHIA COLI (A)  Final   Report Status 08/13/2022 FINAL  Final   Organism ID, Bacteria ESCHERICHIA COLI (A)  Final      Susceptibility   Escherichia coli - MIC*    AMPICILLIN <=2 SENSITIVE Sensitive     CEFAZOLIN <=4 SENSITIVE Sensitive     CEFEPIME <=0.12 SENSITIVE Sensitive     CEFTRIAXONE <=0.25 SENSITIVE Sensitive     CIPROFLOXACIN <=0.25 SENSITIVE Sensitive     GENTAMICIN <=1 SENSITIVE Sensitive     IMIPENEM <=0.25 SENSITIVE Sensitive     NITROFURANTOIN <=16 SENSITIVE Sensitive     TRIMETH/SULFA <=20 SENSITIVE Sensitive     AMPICILLIN/SULBACTAM <=2 SENSITIVE Sensitive     PIP/TAZO <=4 SENSITIVE Sensitive     * >=100,000 COLONIES/mL ESCHERICHIA COLI  Culture, blood (x 2)     Status: Abnormal   Collection Time: 08/11/22 10:56 PM   Specimen: BLOOD  Result Value Ref Range Status   Specimen Description    Final    BLOOD LEFT ANTECUBITAL Performed at [facility], [place] [address], [place], [place] [address]    Special Requests   Final    BOTTLES DRAWN AEROBIC AND ANAEROBIC Blood Culture adequate volume Performed at [facility] [address] [place], [place] [address]    Culture  Setup Time   Final    GRAM POSITIVE COCCI IN CLUSTERS AEROBIC BOTTLE ONLY CRITICAL RESULT CALLED TO, READ BACK BY AND VERIFIED WITH: [place] [phone] FCP    Culture (A)  Final    STAPHYLOCOCCUS AURICULARIS THE SIGNIFICANCE OF ISOLATING THIS ORGANISM FROM A SINGLE SET OF BLOOD CULTURES WHEN MULTIPLE SETS ARE DRAWN IS UNCERTAIN. PLEASE NOTIFY THE MICROBIOLOGY DEPARTMENT WITHIN ONE WEEK IF SPECIATION AND SENSITIVITIES ARE REQUIRED. Performed at [facility], [place] [address] [place], [place] [address]    Report Status 08/15/2022 FINAL  Final   Organism ID, Bacteria STAPHYLOCOCCUS AURICULARIS  Final      Susceptibility   Staphylococcus auricularis - MIC*    CIPROFLOXACIN <=0.5 SENSITIVE Sensitive     ERYTHROMYCIN >=8 RESISTANT Resistant     GENTAMICIN <=0.5 SENSITIVE Sensitive     OXACILLIN >=4 RESISTANT Resistant     TETRACYCLINE 2 SENSITIVE Sensitive     VANCOMYCIN 1 SENSITIVE Sensitive     TRIMETH/SULFA <=10 SENSITIVE Sensitive     CLINDAMYCIN <=0.25 SENSITIVE Sensitive     RIFAMPIN <=0.5 SENSITIVE Sensitive     Inducible Clindamycin NEGATIVE Sensitive     * STAPHYLOCOCCUS AURICULARIS  Blood Culture ID Panel (Reflexed)     Status: Abnormal   Collection Time: 08/11/22 10:56 PM  Result Value Ref Range Status   Enterococcus faecalis NOT DETECTED NOT DETECTED Final   Enterococcus Faecium NOT DETECTED NOT DETECTED Final   Listeria monocytogenes NOT DETECTED NOT DETECTED Final   Staphylococcus species DETECTED (A) NOT DETECTED Final    Comment: CRITICAL RESULT CALLED TO, READ BACK BY AND VERIFIED WITH: PHARMD J. GADHIA [phone] FCP     Staphylococcus aureus (BCID) NOT DETECTED NOT DETECTED Final   Staphylococcus epidermidis NOT DETECTED NOT DETECTED Final   Staphylococcus lugdunensis NOT DETECTED NOT DETECTED Final   Streptococcus species NOT DETECTED NOT DETECTED Final   Streptococcus agalactiae NOT DETECTED NOT DETECTED Final   Streptococcus pneumoniae NOT DETECTED NOT DETECTED Final   Streptococcus pyogenes NOT DETECTED NOT DETECTED Final   A.calcoaceticus-baumannii NOT DETECTED NOT DETECTED Final   Bacteroides fragilis NOT DETECTED NOT DETECTED Final   Enterobacterales NOT DETECTED NOT DETECTED Final   Enterobacter cloacae complex NOT DETECTED NOT DETECTED Final   Escherichia coli NOT DETECTED NOT DETECTED Final   Klebsiella aerogenes NOT DETECTED NOT DETECTED Final   Klebsiella oxytoca NOT DETECTED NOT DETECTED Final   Klebsiella pneumoniae NOT DETECTED NOT DETECTED Final   Proteus species NOT DETECTED NOT DETECTED Final   Salmonella species NOT DETECTED NOT DETECTED Final   Serratia marcescens NOT DETECTED NOT DETECTED Final   Haemophilus influenzae NOT DETECTED NOT DETECTED Final   Neisseria meningitidis NOT DETECTED NOT DETECTED Final   Pseudomonas aeruginosa NOT DETECTED NOT DETECTED Final   Stenotrophomonas maltophilia NOT DETECTED NOT DETECTED Final   Candida albicans NOT DETECTED NOT DETECTED Final   Candida auris NOT DETECTED NOT DETECTED Final   Candida glabrata NOT DETECTED NOT DETECTED Final   Candida krusei NOT DETECTED NOT DETECTED Final   Candida parapsilosis NOT DETECTED NOT DETECTED Final   Candida tropicalis NOT DETECTED NOT DETECTED Final   Cryptococcus neoformans/gattii NOT DETECTED NOT DETECTED Final  Comment: Performed at [facility], [place] [address], [place], [place] [address]  Culture, blood (x 2)     Status: None (Preliminary result)   Collection Time: 08/11/22 11:10 PM   Specimen: BLOOD  Result Value Ref Range Status   Specimen Description   Final    BLOOD BLOOD LEFT  HAND Performed at [facility] [address], [place], [place] [address]    Special Requests   Final    BOTTLES DRAWN AEROBIC AND ANAEROBIC Blood Culture results may not be optimal due to an inadequate volume of blood received in culture bottles Performed at [facility] [address], [place], [place] [address]    Culture   Final    NO GROWTH 3 DAYS Performed at [facility], [place] [address] [place], [place] [address]    Report Status PENDING  Incomplete    Radiology Studies: No results found.    Taveon Enyeart T. Forbes  If 7PM-7AM, please contact night-coverage [email or web address] 08/15/2022, 2:29 PM

## 2022-08-15 NOTE — Progress Notes (Signed)
  Daily Progress Note   Patient Name: Brianna [name]       Date: 08/15/2022 DOB: Oct 23, 1932  Age: 88 y.o. MRN#: [id number] Attending Physician: Mercy Riding, MD Primary Care Physician: [name], MD Admit Date: 08/11/2022 Length of Stay: 3 days  Patient last seen by this palliative provider on 10/28. Patient has underlying dementia though per reports had been fairly functional/ participatory in ADLs/IADLS at home with family support. Patient focusing on going to rehab with improvement of symptoms related to UTI. Recommended patient receive outpatient home palliative care follow up to continue discussions regarding medical care moving forward. Hope that in the home setting, family can be actively present and involved with these discussions. With this recommendation, PMT will sign off at this time. Thank you for involving our team in patient's care.    Chelsea Aus, DO Palliative Care Provider PMT # [phone]

## 2022-08-15 NOTE — Progress Notes (Signed)
Physical Therapy Treatment Patient Details Name: Brianna [name] MRN: [id number] DOB: [birth year] Today's Date: 08/15/2022   History of Present Illness Brianna [name] is a 88 y.o. female presents with fatigue and confusion. Pt with working diagnosis of sepsis, UTI. PMH: HTN, headache, hypothyroid, monoclonal gammopathy of unknown significance    PT Comments    Pt somewhat lethargic, oriented to self and location but not to year nor situation, pt did not respond to commands. Attempted supine to sit 3x, but pt physically resisted attempts at movement. Performed BLE ROM exercises. Pt and bed linens are saturated in urine, is on a purewick, NT notified.    Recommendations for follow up therapy are one component of a multi-disciplinary discharge planning process, led by the attending physician.  Recommendations may be updated based on patient status, additional functional criteria and insurance authorization.  Follow Up Recommendations  Skilled nursing-short term rehab (<3 hours/day) Can patient physically be transported by private vehicle: No   Assistance Recommended at Discharge Frequent or constant Supervision/Assistance  Patient can return home with the following A lot of help with walking and/or transfers;A lot of help with bathing/dressing/bathroom;Assistance with cooking/housework;Assist for transportation   Equipment Recommendations  None recommended by PT    Recommendations for Other Services       Precautions / Restrictions Precautions Precautions: Fall Restrictions Weight Bearing Restrictions: No     Mobility  Bed Mobility Overal bed mobility: Needs Assistance Bed Mobility: Supine to Sit     Supine to sit: Total assist     General bed mobility comments: pt unable to come to full upright position with total assist of 1, pt resisted attempt to move supine to sit with posterior trunk push, did not respond to commands for technique, pt stated she's too tired to do  anything, no +2 assist available. Noted pt saturated in urine, bedsheets also saturated, pt has purewick. NT notified. Total assist to scoot up in bed.    Transfers                        Ambulation/Gait                   Stairs             Wheelchair Mobility    Modified Rankin (Stroke Patients Only)       Balance                                            Cognition Arousal/Alertness: Lethargic Behavior During Therapy: Flat affect Overall Cognitive Status: No family/caregiver present to determine baseline cognitive functioning                                 General Comments: oriented to self and location, could not state current year, did not respond to commands, lethargic        Exercises General Exercises - Lower Extremity Heel Slides: AAROM, Both, 10 reps, Supine Hip ABduction/ADduction: AAROM, Both, 10 reps, Supine    General Comments        Pertinent Vitals/Pain Pain Assessment Faces Pain Scale: No hurt Breathing: normal Negative Vocalization: none Facial Expression: smiling or inexpressive Body Language: relaxed Consolability: no need to console PAINAD Score: 0    Home Living  Prior Function            PT Goals (current goals can now be found in the care plan section) Acute Rehab PT Goals PT Goal Formulation: Patient unable to participate in goal setting Time For Goal Achievement: 08/26/22 Potential to Achieve Goals: Fair Progress towards PT goals: Not progressing toward goals - comment (lethargy, confusion)    Frequency    Min 2X/week      PT Plan Current plan remains appropriate    Co-evaluation              AM-PAC PT "6 Clicks" Mobility   Outcome Measure  Help needed turning from your back to your side while in a flat bed without using bedrails?: A Lot Help needed moving from lying on your back to sitting on the side of a flat bed  without using bedrails?: Total Help needed moving to and from a bed to a chair (including a wheelchair)?: Total Help needed standing up from a chair using your arms (e.g., wheelchair or bedside chair)?: Total Help needed to walk in hospital room?: Total Help needed climbing 3-5 steps with a railing? : Total 6 Click Score: 7    End of Session Equipment Utilized During Treatment: Gait belt Activity Tolerance: Patient limited by fatigue Patient left: in bed;with bed alarm set;with call bell/phone within reach Nurse Communication: Mobility status;Other (comment) (pt saturated in urine, NT notified) PT Visit Diagnosis: Unsteadiness on feet (R26.81);Muscle weakness (generalized) (M62.81);Difficulty in walking, not elsewhere classified (R26.2)     Time: 0802-2336 PT Time Calculation (min) (ACUTE ONLY): 15 min  Charges:  $Therapeutic Activity: 8-22 mins                     Blondell Reveal Kistler PT 08/15/2022  Acute Rehabilitation Services  Office [phone]

## 2022-08-16 DIAGNOSIS — A419 Sepsis, unspecified organism: Secondary | ICD-10-CM | POA: Diagnosis not present

## 2022-08-16 DIAGNOSIS — N179 Acute kidney failure, unspecified: Secondary | ICD-10-CM | POA: Diagnosis not present

## 2022-08-16 DIAGNOSIS — N3090 Cystitis, unspecified without hematuria: Secondary | ICD-10-CM | POA: Diagnosis not present

## 2022-08-16 DIAGNOSIS — N182 Chronic kidney disease, stage 2 (mild): Secondary | ICD-10-CM | POA: Diagnosis not present

## 2022-08-16 LAB — RENAL FUNCTION PANEL
Albumin: 2.8 g/dL — ABNORMAL LOW (ref 3.5–5.0)
Anion gap: 9 (ref 5–15)
BUN: 13 mg/dL (ref 8–23)
CO2: 26 mmol/L (ref 22–32)
Calcium: 8.4 mg/dL — ABNORMAL LOW (ref 8.9–10.3)
Chloride: 100 mmol/L (ref 98–111)
Creatinine, Ser: 1.14 mg/dL — ABNORMAL HIGH (ref 0.44–1.00)
GFR, Estimated: 47 mL/min — ABNORMAL LOW (ref >60)
Glucose, Bld: 114 mg/dL — ABNORMAL HIGH (ref 70–99)
Phosphorus: 2.9 mg/dL (ref 2.5–4.6)
Potassium: 3.5 mmol/L (ref 3.5–5.1)
Sodium: 135 mmol/L (ref 135–145)

## 2022-08-16 LAB — MAGNESIUM: Magnesium: 1.7 mg/dL (ref 1.7–2.4)

## 2022-08-16 MED ORDER — LEVOTHYROXINE SODIUM 125 MCG PO TABS: 125.0000 ug | ORAL_TABLET | Freq: Every day | ORAL | Status: AC

## 2022-08-16 MED ORDER — ENSURE ENLIVE PO LIQD: 237.0000 mL | Freq: Two times a day (BID) | ORAL | 12 refills | Status: AC

## 2022-08-16 NOTE — TOC Progression Note (Addendum)
Transition of Care [facility]) - Progression Note    Patient Details  Name: Brianna [name] MRN: [id number] Date of Birth: [birth year]  Transition of Care [facility]) CM/SW [place], LCSW Phone Number: 08/16/2022, 8:53 AM  Clinical Narrative:    Reviewed bed offers with pt's brother who has accepted SNF placement at [facility] in [place]. CSW reached out to [facility] who are to reach back out to CSW after their morning meeting for bed availability. Insurance authorization has been requested and currently pending approval.   Update 10:00am- [facility] able to [facility] this pt once insurance authorization has been approved.   Update 2:38pm- Pt's insurance Josem Kaufmann is still pending.   Update 3:50pm- Pt's insurance has been approved for SNF from 10/31 to 11/02. Navi ID: [id number]. [facility] is able to accept pt first thing tomorrow morning and have requested for DC summary to be sent as soon as possible.   Expected Discharge Plan: [place] Barriers to Discharge: Continued Medical Work up, SNF Pending bed offer  Expected Discharge Plan and Services Expected Discharge Plan: [place] In-house Referral: Hospice / Palliative Care Discharge Planning Services: CM Consult Post Acute Care Choice: [place] Living arrangements for the past 2 months: Single Family Home Expected Discharge Date: 08/16/22               DME Arranged: N/A DME Agency: NA                   Social Determinants of Health (SDOH) Interventions    Readmission Risk Interventions    08/15/2022    1:04 PM  Readmission Risk Prevention Plan  Post Dischage Appt Complete  Medication Screening Complete  Transportation Screening Complete

## 2022-08-16 NOTE — Progress Notes (Signed)
PROGRESS NOTE  Brianna [name] LYY:[id number] DOB: 28-Sep-1933   PCP: [name], MD  Patient is from: Home.  Reports living with son.  Independently ambulates at baseline but walks slowly  DOA: 08/11/2022 LOS: 4  Chief complaints Chief Complaint  Patient presents with   Possible UTI     Brief Narrative / Interim history: 88 year old F with PMH of dementia, HTN, hypothyroidism, MGUS and recurrent UTI presenting with confusion and fatigue, and admitted for sepsis due to UTI and AKI. Patient was febrile to 100.9 with some leukocytosis.  UA concerning for UTI.  Take acid 2.1.  Last Pro-Cal 4.83.  TSH elevated to 66.  Cultures and thyroid panel ordered.  Started on IV fluid and IV antibiotics.   GI bleed with staph species, not Staph aureus.  Urine culture with pansensitive E. coli.  Antibiotic de-escalated to p.o. cefadroxil.  Therapy recommended SNF.  Medically optimized for discharge pending insurance authorization for SNF.  Subjective: Seen and examined earlier this morning.  No major events overnight of this morning.  No complaints.  Feels well today.  Objective: Vitals:   08/15/22 1600 08/15/22 2256 08/16/22 0514 08/16/22 1212  BP:  (!) 153/64 (!) 134/46 126/60  Pulse:  74 65 75  Resp: '15 18 14 16  '$ Temp:  97.9 F (36.6 C) 98.1 F (36.7 C) 98.2 F (36.8 C)  TempSrc:  Oral Oral Oral  SpO2:  98% 99% 97%  Weight:      Height:        Examination:  GENERAL: Appears frail.  No distress. HEENT: MMM.  Vision and hearing grossly intact.  NECK: Supple.  No apparent JVD.  RESP:  No IWOB.  Fair aeration bilaterally. CVS:  RRR. Heart sounds normal.  ABD/GI/GU: BS+. Abd soft, NTND.  MSK/EXT:  Moves extremities.  Significant muscle mass and subcu fat loss. SKIN: no apparent skin lesion or wound NEURO: Awake and alert. Oriented self, place, person and months.  Follows commands.  No apparent focal neuro deficit. PSYCH: Calm. Normal affect.   Procedures:   None  Microbiology summarized: Blood cultures with GPC in clusters in 1 out of 2 aerobic bottles.  BC ID with staph species but not aureus. Urine culture with pansensitive E. coli  Assessment and plan: Principal Problem:   Severe sepsis (HCC) Active Problems:   MGUS (monoclonal gammopathy of unknown significance)   Essential hypertension   Dementia (HCC)   Hypothyroidism   Chronic renal disease, stage II   AKI (acute kidney injury) ([place])   Hypokalemia   Goals of care, counseling/discussion   Cystitis   Palliative care encounter   Severe sepsis due to urinary tract infection: Febrile to 100.9 with leukocytosis, lactic acidosis, AKI and altered mental status.  UA concerning for UTI.  She did denies UTI symptoms but patient is not a reliable historian due to dementia.  Blood and urine cultures as above.  Lactic acidosis resolved.  Hemodynamically stable. -IV CTX 10/26-10/27>> p.o. cefadroxil 10/28>> for a total of 5 days  Confusion/dementia without behavioral disturbance: No focal neurodeficits.  CT head without acute finding.  This seems to have resolved.  She is awake and alert and oriented x4 except date.  -Treat UTI and hypothyroidism -Treat treatable causes -Reorientation and delirium precaution  Hypothyroidism/markedly elevated TSH: TSH 66.  Total T3 and free T4 very low.  Not taking Synthroid at home. -Continue home Synthroid at 125 mcg daily  AKI/azotemia on CKD-3B: Seems to have resolved. Recent Labs    09/02/21  1635 12/23/21 1629 08/11/22 1700 08/11/22 2310 08/12/22 0343 08/13/22 0530 08/14/22 0530 08/15/22 0515 08/16/22 0533  BUN 23 14 30* 30* 26* '20 15 15 13  '$ CREATININE 1.17* 1.03* 1.88* 1.76* 1.56* 1.32* 1.25* 1.07* 1.14*  -Continue monitoring  Normocytic anemia: Likely anemia of renal disease.  Anemia panel suggests anemia of chronic disease. Recent Labs    09/02/21 1635 08/11/22 1700 08/11/22 2310 08/13/22 0530 08/14/22 0530 08/15/22 0515  HGB  13.0 12.3 10.6* 10.1* 10.9* 10.4*  -Monitor  Generalized weakness: Per patient and family, significant rapid decline.  -OOB/PT/OT  Lactic acidosis: Resolved.  Mild thrombocytopenia: Likely reactive.  Resolved.  Hypokalemia/hypophosphatemia/hyponatremia: -Monitor replenish as appropriate  Goal of care counseling: See goals of care discussion on 10/27.  Remains full code. -Appreciate input by palliative medicine  Increased nutrient needs Body mass index is 26.26 kg/m. Nutrition Problem: Increased nutrient needs Etiology: acute illness Signs/Symptoms: estimated needs Interventions: Boost Breeze, Ensure Enlive (each supplement provides 350kcal and 20 grams of protein)   DVT prophylaxis:  heparin injection 5,000 Units Start: 08/12/22 1415  Code Status: Full code Family Communication: None at bedside today Level of care: Telemetry Status is: Inpatient The patient will remain inpatient because: SNF bed.   Final disposition: SNF Consultants:  Palliative medicine  Sch Meds:  Scheduled Meds:  aspirin EC  81 mg Oral Daily   feeding supplement  1 Container Oral Q24H   feeding supplement  237 mL Oral BID BM   heparin injection (subcutaneous)  5,000 Units Subcutaneous Q8H   levothyroxine  125 mcg Oral Q0600   multivitamin with minerals  1 tablet Oral Daily   Continuous Infusions:   PRN Meds:.  Antimicrobials: Anti-infectives (From admission, onward)    Start     Dose/Rate Route Frequency Ordered Stop   08/13/22 2200  cefadroxil (DURICEF) capsule 500 mg        500 mg Oral 2 times daily 08/13/22 1244 08/16/22 0958   08/12/22 2200  cefTRIAXone (ROCEPHIN) 1 g in sodium chloride 0.9 % 100 mL IVPB  Status:  Discontinued        1 g 200 mL/hr over 30 Minutes Intravenous Every 24 hours 08/11/22 2207 08/13/22 1244   08/11/22 2130  cefTRIAXone (ROCEPHIN) 1 g in sodium chloride 0.9 % 100 mL IVPB        1 g 200 mL/hr over 30 Minutes Intravenous  Once 08/11/22 2122 08/11/22 2230         I have personally reviewed the following labs and images: CBC: Recent Labs  Lab 08/11/22 1700 08/11/22 2310 08/13/22 0530 08/14/22 0530 08/15/22 0515  WBC 11.8* 8.1 5.6 6.8 6.7  NEUTROABS 10.6* 6.5  --   --   --   HGB 12.3 10.6* 10.1* 10.9* 10.4*  HCT 37.6 32.6* 30.8* 33.0* 31.5*  MCV 96.2 97.0 94.2 92.2 92.9  PLT 162 130* 135* 154 178   BMP &GFR Recent Labs  Lab 08/11/22 2310 08/12/22 0343 08/13/22 0530 08/14/22 0530 08/15/22 0515 08/16/22 0533  NA 136 137 136 131* 135 135  K 2.7* 3.1* 3.2* 3.8 3.8 3.5  CL 97* 101 100 100 102 100  CO2 '28 26 26 25 27 26  '$ GLUCOSE 122* 121* 97 103* 113* 114*  BUN 30* 26* '20 15 15 13  '$ CREATININE 1.76* 1.56* 1.32* 1.25* 1.07* 1.14*  CALCIUM 7.4* 7.5* 7.7* 7.6* 8.2* 8.4*  MG 1.8  --  2.0 1.7 1.8 1.7  PHOS 2.3*  --  1.6* 2.1* 2.5 2.9  Estimated Creatinine Clearance: 35.7 mL/min (A) (by C-G formula based on SCr of 1.14 mg/dL (H)). Liver & Pancreas: Recent Labs  Lab 08/11/22 1700 08/11/22 2310 08/12/22 0343 08/13/22 0530 08/14/22 0530 08/15/22 0515 08/16/22 0533  AST 32 24 22 41  --   --   --   ALT '20 16 16 24  '$ --   --   --   ALKPHOS 60 50 52 52  --   --   --   BILITOT 1.4* 1.0 1.0 0.6  --   --   --   PROT 8.3* 7.0 6.7 6.3*  --   --   --   ALBUMIN 3.7 3.0* 2.9* 2.5* 2.7* 2.5* 2.8*   Recent Labs  Lab 08/11/22 1700  LIPASE 33   Recent Labs  Lab 08/11/22 1700  AMMONIA 18   Diabetic: No results for input(s): "HGBA1C" in the last 72 hours. No results for input(s): "GLUCAP" in the last 168 hours. Cardiac Enzymes: Recent Labs  Lab 08/11/22 2310 08/13/22 0530 08/14/22 0530  CKTOTAL 199 385* 341*   No results for input(s): "PROBNP" in the last 8760 hours. Coagulation Profile: Recent Labs  Lab 08/11/22 2310  INR 1.2   Thyroid Function Tests: No results for input(s): "TSH", "T4TOTAL", "FREET4", "T3FREE", "THYROIDAB" in the last 72 hours.  Lipid Profile: No results for input(s): "CHOL", "HDL", "LDLCALC",  "TRIG", "CHOLHDL", "LDLDIRECT" in the last 72 hours. Anemia Panel: No results for input(s): "VITAMINB12", "FOLATE", "FERRITIN", "TIBC", "IRON", "RETICCTPCT" in the last 72 hours.  Urine analysis:    Component Value Date/Time   COLORURINE YELLOW 08/11/2022 2023   APPEARANCEUR CLEAR 08/11/2022 2023   LABSPEC 1.015 08/11/2022 2023   PHURINE 8.0 08/11/2022 2023   GLUCOSEU NEGATIVE 08/11/2022 2023   HGBUR LARGE (A) 08/11/2022 2023   BILIRUBINUR NEGATIVE 08/11/2022 2023   BILIRUBINUR small 12/23/2021 1634   KETONESUR NEGATIVE 08/11/2022 2023   PROTEINUR 100 (A) 08/11/2022 2023   UROBILINOGEN 1.0 12/23/2021 1634   UROBILINOGEN 0.2 03/10/2015 2228   NITRITE NEGATIVE 08/11/2022 2023   LEUKOCYTESUR MODERATE (A) 08/11/2022 2023   Sepsis Labs: Invalid input(s): "PROCALCITONIN", "LACTICIDVEN"  Microbiology: Recent Results (from the past 240 hour(s))  Urine Culture     Status: Abnormal   Collection Time: 08/11/22 10:17 PM   Specimen: Urine, Catheterized  Result Value Ref Range Status   Specimen Description   Final    URINE, CATHETERIZED Performed at [facility] [address], [place], [place] [address]    Special Requests   Final    NONE Performed at [facility], [place] [address], [place], [place] [address]    Culture >=100,000 COLONIES/mL ESCHERICHIA COLI (A)  Final   Report Status 08/13/2022 FINAL  Final   Organism ID, Bacteria ESCHERICHIA COLI (A)  Final      Susceptibility   Escherichia coli - MIC*    AMPICILLIN <=2 SENSITIVE Sensitive     CEFAZOLIN <=4 SENSITIVE Sensitive     CEFEPIME <=0.12 SENSITIVE Sensitive     CEFTRIAXONE <=0.25 SENSITIVE Sensitive     CIPROFLOXACIN <=0.25 SENSITIVE Sensitive     GENTAMICIN <=1 SENSITIVE Sensitive     IMIPENEM <=0.25 SENSITIVE Sensitive     NITROFURANTOIN <=16 SENSITIVE Sensitive     TRIMETH/SULFA <=20 SENSITIVE Sensitive     AMPICILLIN/SULBACTAM <=2 SENSITIVE Sensitive     PIP/TAZO <=4  SENSITIVE Sensitive     * >=100,000 COLONIES/mL ESCHERICHIA COLI  Culture, blood (x 2)     Status: Abnormal  Collection Time: 08/11/22 10:56 PM   Specimen: BLOOD  Result Value Ref Range Status   Specimen Description   Final    BLOOD LEFT ANTECUBITAL Performed at [facility] [address] [place], [place] [address]    Special Requests   Final    BOTTLES DRAWN AEROBIC AND ANAEROBIC Blood Culture adequate volume Performed at [facility] [address], [place], [place] [address]    Culture  Setup Time   Final    GRAM POSITIVE COCCI IN CLUSTERS AEROBIC BOTTLE ONLY CRITICAL RESULT CALLED TO, READ BACK BY AND VERIFIED WITH: [place] [phone] [id number] FCP    Culture (A)  Final    STAPHYLOCOCCUS AURICULARIS THE SIGNIFICANCE OF ISOLATING THIS ORGANISM FROM A SINGLE SET OF BLOOD CULTURES WHEN MULTIPLE SETS ARE DRAWN IS UNCERTAIN. PLEASE NOTIFY THE MICROBIOLOGY DEPARTMENT WITHIN ONE WEEK IF SPECIATION AND SENSITIVITIES ARE REQUIRED. Performed at [facility], [place] [address] [place], [place] [address]    Report Status 08/15/2022 FINAL  Final   Organism ID, Bacteria STAPHYLOCOCCUS AURICULARIS  Final      Susceptibility   Staphylococcus auricularis - MIC*    CIPROFLOXACIN <=0.5 SENSITIVE Sensitive     ERYTHROMYCIN >=8 RESISTANT Resistant     GENTAMICIN <=0.5 SENSITIVE Sensitive     OXACILLIN >=4 RESISTANT Resistant     TETRACYCLINE 2 SENSITIVE Sensitive     VANCOMYCIN 1 SENSITIVE Sensitive     TRIMETH/SULFA <=10 SENSITIVE Sensitive     CLINDAMYCIN <=0.25 SENSITIVE Sensitive     RIFAMPIN <=0.5 SENSITIVE Sensitive     Inducible Clindamycin NEGATIVE Sensitive     * STAPHYLOCOCCUS AURICULARIS  Blood Culture ID Panel (Reflexed)     Status: Abnormal   Collection Time: 08/11/22 10:56 PM  Result Value Ref Range Status   Enterococcus faecalis NOT DETECTED NOT DETECTED Final   Enterococcus Faecium NOT DETECTED NOT DETECTED Final   Listeria  monocytogenes NOT DETECTED NOT DETECTED Final   Staphylococcus species DETECTED (A) NOT DETECTED Final    Comment: CRITICAL RESULT CALLED TO, READ BACK BY AND VERIFIED WITH: PHARMD J. GADHIA [phone] [id number] FCP    Staphylococcus aureus (BCID) NOT DETECTED NOT DETECTED Final   Staphylococcus epidermidis NOT DETECTED NOT DETECTED Final   Staphylococcus lugdunensis NOT DETECTED NOT DETECTED Final   Streptococcus species NOT DETECTED NOT DETECTED Final   Streptococcus agalactiae NOT DETECTED NOT DETECTED Final   Streptococcus pneumoniae NOT DETECTED NOT DETECTED Final   Streptococcus pyogenes NOT DETECTED NOT DETECTED Final   A.calcoaceticus-baumannii NOT DETECTED NOT DETECTED Final   Bacteroides fragilis NOT DETECTED NOT DETECTED Final   Enterobacterales NOT DETECTED NOT DETECTED Final   Enterobacter cloacae complex NOT DETECTED NOT DETECTED Final   Escherichia coli NOT DETECTED NOT DETECTED Final   Klebsiella aerogenes NOT DETECTED NOT DETECTED Final   Klebsiella oxytoca NOT DETECTED NOT DETECTED Final   Klebsiella pneumoniae NOT DETECTED NOT DETECTED Final   Proteus species NOT DETECTED NOT DETECTED Final   Salmonella species NOT DETECTED NOT DETECTED Final   Serratia marcescens NOT DETECTED NOT DETECTED Final   Haemophilus influenzae NOT DETECTED NOT DETECTED Final   Neisseria meningitidis NOT DETECTED NOT DETECTED Final   Pseudomonas aeruginosa NOT DETECTED NOT DETECTED Final   Stenotrophomonas maltophilia NOT DETECTED NOT DETECTED Final   Candida albicans NOT DETECTED NOT DETECTED Final   Candida auris NOT DETECTED NOT DETECTED Final   Candida glabrata NOT DETECTED NOT DETECTED Final   Candida krusei NOT DETECTED  NOT DETECTED Final   Candida parapsilosis NOT DETECTED NOT DETECTED Final   Candida tropicalis NOT DETECTED NOT DETECTED Final   Cryptococcus neoformans/gattii NOT DETECTED NOT DETECTED Final    Comment: Performed at [facility], [address], [place], [place]  [address]  Culture, blood (x 2)     Status: None (Preliminary result)   Collection Time: 08/11/22 11:10 PM   Specimen: BLOOD  Result Value Ref Range Status   Specimen Description   Final    BLOOD BLOOD LEFT HAND Performed at [facility] [address] [place], [place] [address]    Special Requests   Final    BOTTLES DRAWN AEROBIC AND ANAEROBIC Blood Culture results may not be optimal due to an inadequate volume of blood received in culture bottles Performed at [facility] [address] [place], [place] [address]    Culture   Final    NO GROWTH 4 DAYS Performed at [facility], [place] [address], [place], [place] [address]    Report Status PENDING  Incomplete    Radiology Studies: No results found.    Danique Hartsough T. Sinclair  If 7PM-7AM, please contact night-coverage [email or web address] 08/16/2022, 3:34 PM

## 2022-08-16 NOTE — Discharge Summary (Signed)
Physician Discharge Summary  Brianna [name] OMB:[id number] DOB: May 03, 1933 DOA: 08/11/2022  PCP: [name], MD  Admit date: 08/11/2022 Discharge date: 08/16/2022 Admitted From: Home Disposition: SNF Recommendations for Outpatient Follow-up:  Follow up with PCP in 1 to 2 weeks Check BMP and CBC in 1 to 2 weeks Check TSH in 4 to 6 weeks Palliative follow-up at SNF Please follow up on the following pending results: None   Discharge Condition: Stable CODE STATUS: Full code  Contact information for after-discharge care     [facility] .   Service: Skilled Nursing Contact information: [address] [place] [facility]  88 year old F with PMH of dementia, HTN, hypothyroidism, CKD-3B, MGUS and recurrent UTI presenting with confusion and fatigue, and admitted for sepsis due to UTI and AKI. Patient was febrile to 100.9 with some leukocytosis.  UA concerning for UTI.  Take acid 2.1.  Last Pro-Cal 4.83.  TSH elevated to 66.  Cultures and thyroid panel ordered.  Started on IV fluid and IV antibiotics.    GI bleed with staph species, not Staph aureus.  Urine culture with pansensitive E. coli.  Antibiotic de-escalated to p.o. cefadroxil.  She completed course on 10/31.  AKI and encephalopathy resolved.  Therapy recommended SNF.   See individual problem list below for more.   Problems addressed during this hospitalization Principal Problem:   Severe sepsis ([place]) Active Problems:   MGUS (monoclonal gammopathy of unknown significance)   Essential hypertension   Dementia (HCC)   Hypothyroidism   Chronic renal disease, stage II   AKI (acute kidney injury) ([place])   Hypokalemia   Goals of care, counseling/discussion   Cystitis   Palliative care encounter  Severe sepsis due to urinary tract infection: Febrile to 100.9 with leukocytosis, lactic acidosis, AKI  and altered mental status.  UA concerning for UTI.  She did denies UTI symptoms but patient is not a reliable historian due to dementia.  Blood and urine cultures as above.  Lactic acidosis resolved.  Hemodynamically stable. -IV CTX 10/26-10/27>> p.o. cefadroxil 10/28-10/31   Confusion/dementia without behavioral disturbance: Likely due to UTI and hypothyroidism.  She is awake and alert and oriented x4 except date.  -Reorientation and delirium precaution   Hypothyroidism/markedly elevated TSH: TSH 66.  Total T3 and free T4 very low.  Not taking Synthroid at home. -Continue home Synthroid at 125 mcg daily -Check TSH in 4 to 6 weeks   AKI/azotemia on CKD-3B: Seems to have resolved. Recent Labs (within last 365 days)             Recent Labs    09/02/21 1635 12/23/21 1629 08/11/22 1700 08/11/22 2310 08/12/22 0343 08/13/22 0530 08/14/22 0530 08/15/22 0515 08/16/22 0533  BUN 23 14 30* 30* 26* '20 15 15 13  '$ CREATININE 1.17* 1.03* 1.88* 1.76* 1.56* 1.32* 1.25* 1.07* 1.14*    -Recheck in 1 to 2 weeks   Normocytic anemia: Likely anemia of renal disease.  Anemia panel suggests anemia of chronic disease. Recent Labs (within last 365 days)          Recent Labs    09/02/21 1635 08/11/22 1700 08/11/22 2310 08/13/22 0530 08/14/22 0530 08/15/22 0515  HGB 13.0 12.3 10.6* 10.1* 10.9* 10.4*    -Recheck in 1 to 2 weeks   Generalized weakness:  Per patient and family, significant rapid decline.  -Continue PT/OT   Lactic acidosis: Resolved.   Mild thrombocytopenia: Resolved.   Hypokalemia/hypophosphatemia/hyponatremia: Resolved.   Goal of care counseling: Palliative medicine consulted. -Palliative follow-up outpatient.   Increased nutrient needs Nutrition Problem: Increased nutrient needs Etiology: acute illness Signs/Symptoms: estimated needs Interventions: Boost Breeze, Ensure Enlive (each supplement provides 350kcal and 20 grams of protein)     Vital signs Vitals:    08/15/22 1600 08/15/22 2256 08/16/22 0514 08/16/22 1212  BP:  (!) 153/64 (!) 134/46 126/60  Pulse:  74 65 75  Temp:  97.9 F (36.6 C) 98.1 F (36.7 C) 98.2 F (36.8 C)  Resp: '15 18 14 16  '$ Height:      Weight:      SpO2:  98% 99% 97%  TempSrc:  Oral Oral Oral  BMI (Calculated):         Discharge exam  GENERAL: Appears frail.  No distress. HEENT: MMM.  Vision and hearing grossly intact.  NECK: Supple.  No apparent JVD.  RESP:  No IWOB.  Fair aeration bilaterally. CVS:  RRR. Heart sounds normal.  ABD/GI/GU: BS+. Abd soft, NTND.  MSK/EXT:  Moves extremities.  Significant muscle mass and subcu fat loss. SKIN: no apparent skin lesion or wound NEURO: Awake and alert. Oriented self, place, person and months.  Follows commands.  No apparent focal neuro deficit. PSYCH: Calm. Normal affect.   Discharge Instructions  Allergies as of 08/16/2022       Reactions   Iodine Other (See Comments)   Reaction unknown   Sulfa Antibiotics Other (See Comments)   Childhood reaction        Medication List     STOP taking these medications    amLODipine 5 MG tablet Commonly known as: NORVASC   fish oil-omega-3 fatty acids 1000 MG capsule   furosemide 40 MG tablet Commonly known as: LASIX   nystatin powder Commonly known as: nystatin       TAKE these medications    aspirin 81 MG tablet Take 81 mg by mouth daily.   feeding supplement Liqd Take 237 mLs by mouth 2 (two) times daily between meals.   levothyroxine 125 MCG tablet Commonly known as: SYNTHROID Take 1 tablet (125 mcg total) by mouth daily at 6 (six) AM. Start taking on: August 17, 2022 What changed: See the new instructions.   melatonin 5 MG Tabs Take 1 tablet by mouth at bedtime as needed.   multivitamin with minerals Tabs tablet Take 1 tablet by mouth daily.   Vitamin D3 125 MCG (5000 UT) Caps Take 5,000 Units by mouth daily.        Consultations: Palliative medicine  Procedures/Studies:   CT  Head Wo Contrast  Result Date: 08/11/2022 CLINICAL DATA:  Mental status change, unknown cause EXAM: CT HEAD WITHOUT CONTRAST TECHNIQUE: Contiguous axial images were obtained from the base of the skull through the vertex without intravenous contrast. RADIATION DOSE REDUCTION: This exam was performed according to the departmental dose-optimization program which includes automated exposure control, adjustment of the mA and/or kV according to patient size and/or use of iterative reconstruction technique. COMPARISON:  MRI head 09/02/2018, CT head 09/02/2018 BRAIN: BRAIN Cerebral ventricle sizes are concordant with the degree of cerebral volume loss. No evidence of large-territorial acute infarction. No parenchymal hemorrhage. No mass lesion. No extra-axial collection. No mass effect or midline shift. No hydrocephalus. Basilar cisterns are patent. Vascular: No hyperdense vessel. Skull: No acute fracture or focal lesion. Sinuses/Orbits: Paranasal  sinuses and mastoid air cells are clear. Bilateral lens replacement. Otherwise the orbits are unremarkable. Other: None. IMPRESSION: No acute intracranial abnormality. Electronically Signed   By: Iven Finn M.D.   On: 08/11/2022 17:37   DG Chest 2 View  Result Date: 08/11/2022 CLINICAL DATA:  Altered mental status. EXAM: CHEST - 2 VIEW COMPARISON:  Chest radiograph dated 08/16/2012. FINDINGS: No focal consolidation, pleural effusion, pneumothorax. The cardiac silhouette is within normal limits. No acute osseous pathology. IMPRESSION: No active cardiopulmonary disease. Electronically Signed   By: Anner Crete M.D.   On: 08/11/2022 17:27       The results of significant diagnostics from this hospitalization (including imaging, microbiology, ancillary and laboratory) are listed below for reference.     Microbiology: Recent Results (from the past 240 hour(s))  Urine Culture     Status: Abnormal   Collection Time: 08/11/22 10:17 PM   Specimen: Urine,  Catheterized  Result Value Ref Range Status   Specimen Description   Final    URINE, CATHETERIZED Performed at [facility] [address], [place], [place] [address]    Special Requests   Final    NONE Performed at [facility], [place] [address], [place], [place] [address]    Culture >=100,000 COLONIES/mL ESCHERICHIA COLI (A)  Final   Report Status 08/13/2022 FINAL  Final   Organism ID, Bacteria ESCHERICHIA COLI (A)  Final      Susceptibility   Escherichia coli - MIC*    AMPICILLIN <=2 SENSITIVE Sensitive     CEFAZOLIN <=4 SENSITIVE Sensitive     CEFEPIME <=0.12 SENSITIVE Sensitive     CEFTRIAXONE <=0.25 SENSITIVE Sensitive     CIPROFLOXACIN <=0.25 SENSITIVE Sensitive     GENTAMICIN <=1 SENSITIVE Sensitive     IMIPENEM <=0.25 SENSITIVE Sensitive     NITROFURANTOIN <=16 SENSITIVE Sensitive     TRIMETH/SULFA <=20 SENSITIVE Sensitive     AMPICILLIN/SULBACTAM <=2 SENSITIVE Sensitive     PIP/TAZO <=4 SENSITIVE Sensitive     * >=100,000 COLONIES/mL ESCHERICHIA COLI  Culture, blood (x 2)     Status: Abnormal   Collection Time: 08/11/22 10:56 PM   Specimen: BLOOD  Result Value Ref Range Status   Specimen Description   Final    BLOOD LEFT ANTECUBITAL Performed at [facility] [address], [place], [place] [address]    Special Requests   Final    BOTTLES DRAWN AEROBIC AND ANAEROBIC Blood Culture adequate volume Performed at [facility] [address], [place], [place] [address]    Culture  Setup Time   Final    GRAM POSITIVE COCCI IN CLUSTERS AEROBIC BOTTLE ONLY CRITICAL RESULT CALLED TO, READ BACK BY AND VERIFIED WITH: [place] [phone] FCP    Culture (A)  Final    STAPHYLOCOCCUS AURICULARIS THE SIGNIFICANCE OF ISOLATING THIS ORGANISM FROM A SINGLE SET OF BLOOD CULTURES WHEN MULTIPLE SETS ARE DRAWN IS UNCERTAIN. PLEASE NOTIFY THE MICROBIOLOGY DEPARTMENT WITHIN ONE WEEK IF SPECIATION AND  SENSITIVITIES ARE REQUIRED. Performed at [facility], [place] [address], [place],  [address]    Report Status 08/15/2022 FINAL  Final   Organism ID, Bacteria STAPHYLOCOCCUS AURICULARIS  Final      Susceptibility   Staphylococcus auricularis - MIC*    CIPROFLOXACIN <=0.5 SENSITIVE Sensitive     ERYTHROMYCIN >=8 RESISTANT Resistant     GENTAMICIN <=0.5 SENSITIVE Sensitive     OXACILLIN >=4 RESISTANT Resistant  TETRACYCLINE 2 SENSITIVE Sensitive     VANCOMYCIN 1 SENSITIVE Sensitive     TRIMETH/SULFA <=10 SENSITIVE Sensitive     CLINDAMYCIN <=0.25 SENSITIVE Sensitive     RIFAMPIN <=0.5 SENSITIVE Sensitive     Inducible Clindamycin NEGATIVE Sensitive     * STAPHYLOCOCCUS AURICULARIS  Blood Culture ID Panel (Reflexed)     Status: Abnormal   Collection Time: 08/11/22 10:56 PM  Result Value Ref Range Status   Enterococcus faecalis NOT DETECTED NOT DETECTED Final   Enterococcus Faecium NOT DETECTED NOT DETECTED Final   Listeria monocytogenes NOT DETECTED NOT DETECTED Final   Staphylococcus species DETECTED (A) NOT DETECTED Final    Comment: CRITICAL RESULT CALLED TO, READ BACK BY AND VERIFIED WITH: PHARMD J. GADHIA [phone] FCP    Staphylococcus aureus (BCID) NOT DETECTED NOT DETECTED Final   Staphylococcus epidermidis NOT DETECTED NOT DETECTED Final   Staphylococcus lugdunensis NOT DETECTED NOT DETECTED Final   Streptococcus species NOT DETECTED NOT DETECTED Final   Streptococcus agalactiae NOT DETECTED NOT DETECTED Final   Streptococcus pneumoniae NOT DETECTED NOT DETECTED Final   Streptococcus pyogenes NOT DETECTED NOT DETECTED Final   A.calcoaceticus-baumannii NOT DETECTED NOT DETECTED Final   Bacteroides fragilis NOT DETECTED NOT DETECTED Final   Enterobacterales NOT DETECTED NOT DETECTED Final   Enterobacter cloacae complex NOT DETECTED NOT DETECTED Final   Escherichia coli NOT DETECTED NOT DETECTED Final   Klebsiella aerogenes NOT DETECTED NOT DETECTED Final    Klebsiella oxytoca NOT DETECTED NOT DETECTED Final   Klebsiella pneumoniae NOT DETECTED NOT DETECTED Final   Proteus species NOT DETECTED NOT DETECTED Final   Salmonella species NOT DETECTED NOT DETECTED Final   Serratia marcescens NOT DETECTED NOT DETECTED Final   Haemophilus influenzae NOT DETECTED NOT DETECTED Final   Neisseria meningitidis NOT DETECTED NOT DETECTED Final   Pseudomonas aeruginosa NOT DETECTED NOT DETECTED Final   Stenotrophomonas maltophilia NOT DETECTED NOT DETECTED Final   Candida albicans NOT DETECTED NOT DETECTED Final   Candida auris NOT DETECTED NOT DETECTED Final   Candida glabrata NOT DETECTED NOT DETECTED Final   Candida krusei NOT DETECTED NOT DETECTED Final   Candida parapsilosis NOT DETECTED NOT DETECTED Final   Candida tropicalis NOT DETECTED NOT DETECTED Final   Cryptococcus neoformans/gattii NOT DETECTED NOT DETECTED Final    Comment: Performed at [facility], [address] [place], [place] [address]  Culture, blood (x 2)     Status: None (Preliminary result)   Collection Time: 08/11/22 11:10 PM   Specimen: BLOOD  Result Value Ref Range Status   Specimen Description   Final    BLOOD BLOOD LEFT HAND Performed at [facility] [address], [place], [place] [address]    Special Requests   Final    BOTTLES DRAWN AEROBIC AND ANAEROBIC Blood Culture results may not be optimal due to an inadequate volume of blood received in culture bottles Performed at [facility] [address], [place], [place] [address]    Culture   Final    NO GROWTH 4 DAYS Performed at [facility], [place] [address], [place], [place] [address]    Report Status PENDING  Incomplete     Labs:  CBC: Recent Labs  Lab 08/11/22 1700 08/11/22 2310 08/13/22 0530 08/14/22 0530 08/15/22 0515  WBC 11.8* 8.1 5.6 6.8 6.7  NEUTROABS 10.6* 6.5  --   --   --   HGB 12.3 10.6* 10.1* 10.9* 10.4*  HCT 37.6 32.6* 30.8* 33.0* 31.5*   MCV 96.2 97.0 94.2 92.2 92.9  PLT 162 130* 135* 154 178   BMP &GFR Recent Labs  Lab 08/11/22 2310 08/12/22 0343 08/13/22 0530 08/14/22 0530 08/15/22 0515 08/16/22 0533  NA 136 137 136 131* 135 135  K 2.7* 3.1* 3.2* 3.8 3.8 3.5  CL 97* 101 100 100 102 100  CO2 '28 26 26 25 27 26  '$ GLUCOSE 122* 121* 97 103* 113* 114*  BUN 30* 26* '20 15 15 13  '$ CREATININE 1.76* 1.56* 1.32* 1.25* 1.07* 1.14*  CALCIUM 7.4* 7.5* 7.7* 7.6* 8.2* 8.4*  MG 1.8  --  2.0 1.7 1.8 1.7  PHOS 2.3*  --  1.6* 2.1* 2.5 2.9   Estimated Creatinine Clearance: 35.7 mL/min (A) (by C-G formula based on SCr of 1.14 mg/dL (H)). Liver & Pancreas: Recent Labs  Lab 08/11/22 1700 08/11/22 2310 08/12/22 0343 08/13/22 0530 08/14/22 0530 08/15/22 0515 08/16/22 0533  AST 32 24 22 41  --   --   --   ALT '20 16 16 24  '$ --   --   --   ALKPHOS 60 50 52 52  --   --   --   BILITOT 1.4* 1.0 1.0 0.6  --   --   --   PROT 8.3* 7.0 6.7 6.3*  --   --   --   ALBUMIN 3.7 3.0* 2.9* 2.5* 2.7* 2.5* 2.8*   Recent Labs  Lab 08/11/22 1700  LIPASE 33   Recent Labs  Lab 08/11/22 1700  AMMONIA 18   Diabetic: No results for input(s): "HGBA1C" in the last 72 hours. No results for input(s): "GLUCAP" in the last 168 hours. Cardiac Enzymes: Recent Labs  Lab 08/11/22 2310 08/13/22 0530 08/14/22 0530  CKTOTAL 199 385* 341*   No results for input(s): "PROBNP" in the last 8760 hours. Coagulation Profile: Recent Labs  Lab 08/11/22 2310  INR 1.2   Thyroid Function Tests: No results for input(s): "TSH", "T4TOTAL", "FREET4", "T3FREE", "THYROIDAB" in the last 72 hours. Lipid Profile: No results for input(s): "CHOL", "HDL", "LDLCALC", "TRIG", "CHOLHDL", "LDLDIRECT" in the last 72 hours. Anemia Panel: No results for input(s): "VITAMINB12", "FOLATE", "FERRITIN", "TIBC", "IRON", "RETICCTPCT" in the last 72 hours. Urine analysis:    Component Value Date/Time   COLORURINE YELLOW 08/11/2022 2023   APPEARANCEUR CLEAR 08/11/2022 2023    LABSPEC 1.015 08/11/2022 2023   PHURINE 8.0 08/11/2022 2023   GLUCOSEU NEGATIVE 08/11/2022 2023   HGBUR LARGE (A) 08/11/2022 2023   BILIRUBINUR NEGATIVE 08/11/2022 2023   BILIRUBINUR small 12/23/2021 1634   KETONESUR NEGATIVE 08/11/2022 2023   PROTEINUR 100 (A) 08/11/2022 2023   UROBILINOGEN 1.0 12/23/2021 1634   UROBILINOGEN 0.2 03/10/2015 2228   NITRITE NEGATIVE 08/11/2022 2023   LEUKOCYTESUR MODERATE (A) 08/11/2022 2023   Sepsis Labs: Invalid input(s): "PROCALCITONIN", "LACTICIDVEN"   SIGNED:  Mercy Riding, MD  [facility] 08/16/2022, 4:00 PM

## 2022-08-17 DIAGNOSIS — N1832 Chronic kidney disease, stage 3b: Secondary | ICD-10-CM | POA: Diagnosis not present

## 2022-08-17 DIAGNOSIS — Z9181 History of falling: Secondary | ICD-10-CM | POA: Diagnosis not present

## 2022-08-17 DIAGNOSIS — N390 Urinary tract infection, site not specified: Secondary | ICD-10-CM | POA: Diagnosis not present

## 2022-08-17 DIAGNOSIS — F0390 Unspecified dementia without behavioral disturbance: Secondary | ICD-10-CM | POA: Diagnosis not present

## 2022-08-17 DIAGNOSIS — D649 Anemia, unspecified: Secondary | ICD-10-CM | POA: Diagnosis not present

## 2022-08-17 DIAGNOSIS — G8929 Other chronic pain: Secondary | ICD-10-CM | POA: Diagnosis not present

## 2022-08-17 DIAGNOSIS — I129 Hypertensive chronic kidney disease with stage 1 through stage 4 chronic kidney disease, or unspecified chronic kidney disease: Secondary | ICD-10-CM | POA: Diagnosis not present

## 2022-08-17 DIAGNOSIS — I10 Essential (primary) hypertension: Secondary | ICD-10-CM | POA: Diagnosis not present

## 2022-08-17 DIAGNOSIS — E039 Hypothyroidism, unspecified: Secondary | ICD-10-CM | POA: Diagnosis not present

## 2022-08-17 DIAGNOSIS — R531 Weakness: Secondary | ICD-10-CM | POA: Diagnosis not present

## 2022-08-17 DIAGNOSIS — N183 Chronic kidney disease, stage 3 unspecified: Secondary | ICD-10-CM | POA: Diagnosis not present

## 2022-08-17 DIAGNOSIS — D472 Monoclonal gammopathy: Secondary | ICD-10-CM | POA: Diagnosis not present

## 2022-08-17 DIAGNOSIS — R262 Difficulty in walking, not elsewhere classified: Secondary | ICD-10-CM | POA: Diagnosis not present

## 2022-08-17 DIAGNOSIS — R2689 Other abnormalities of gait and mobility: Secondary | ICD-10-CM | POA: Diagnosis not present

## 2022-08-17 DIAGNOSIS — Z7401 Bed confinement status: Secondary | ICD-10-CM | POA: Diagnosis not present

## 2022-08-17 DIAGNOSIS — E663 Overweight: Secondary | ICD-10-CM | POA: Diagnosis not present

## 2022-08-17 LAB — CULTURE, BLOOD (ROUTINE X 2): Culture: NO GROWTH

## 2022-08-17 NOTE — TOC Transition Note (Addendum)
Transition of Care [facility]) - CM/SW Discharge Note   Patient Details  Name: Brianna [name] MRN: [id number] Date of Birth: [birth year]  [facility] CM/SW Contact:  Vassie Moselle, LCSW Phone Number: 08/17/2022, 9:39 AM   Clinical Narrative:    Pt is to transfer to [facility] for SNF placement. Pt will be going to room 314b. RN to call report to [phone]. Spoke with pt's brother and confirmed discharge plans. CSW answered all questions regarding insurance and length of stay. Pt will be transported to facility via [facility].    Final next level of care: Skilled Nursing Facility Barriers to Discharge: Barriers Resolved   Patient Goals and CMS Choice Patient states their goals for this hospitalization and ongoing recovery are:: To go to SNF and then home CMS Medicare.gov Compare Post Acute Care list provided to:: Patient Choice offered to / list presented to : Patient, Sibling  Discharge Placement PASRR number recieved: 08/15/22            Patient chooses bed at: [facility] Patient to be transferred to facility by: [place] Name of family member notified: Caroline Sauger Patient and family notified of of transfer: 08/17/22  Discharge Plan and Services In-house Referral: Hospice / Palliative Care Discharge Planning Services: CM Consult Post Acute Care Choice: [facility]          DME Arranged: N/A DME Agency: NA                  Social Determinants of Health ([place]) Interventions     Readmission Risk Interventions    08/15/2022    1:04 PM  Readmission Risk Prevention Plan  Post Dischage Appt Complete  Medication Screening Complete  Transportation Screening Complete

## 2022-08-17 NOTE — Progress Notes (Signed)
Patent seen and examined Alert awake Moving all her extremities On RA Offers no complaints- Issues addressed: Sepsis 2/2 E coli UTI -pansensitive-completed antibiotics Dementia/confusion on admission-Mentation stable Check TSH/FT4 In 3-4 wks Aki-resolved Anemia-stable in 10gm Weakness-ptot at [facility] Electrolyte imbalance-resolved Full code Thrombocytopenia-resolved Labs 10/31 reviewed- and cbc 10/30 reviewed  Dc summary reviewed  from 08/16/22 and agree- no changes needed. Discharge Date: 08/17/22 to SNF

## 2022-08-17 NOTE — Progress Notes (Signed)
Attempted to call report to [facility] with no answer. IV removed without complications. Pt has all pt belongings in her possession. Will  attempt to call report again prior to discharge.

## 2022-08-17 NOTE — Progress Notes (Signed)
Called and reviewed AVS with Misty at [facility].

## 2022-08-19 ENCOUNTER — Telehealth: Payer: Self-pay

## 2022-08-19 DIAGNOSIS — D472 Monoclonal gammopathy: Secondary | ICD-10-CM | POA: Diagnosis not present

## 2022-08-19 DIAGNOSIS — E039 Hypothyroidism, unspecified: Secondary | ICD-10-CM | POA: Diagnosis not present

## 2022-08-19 DIAGNOSIS — I129 Hypertensive chronic kidney disease with stage 1 through stage 4 chronic kidney disease, or unspecified chronic kidney disease: Secondary | ICD-10-CM | POA: Diagnosis not present

## 2022-08-19 DIAGNOSIS — F0390 Unspecified dementia without behavioral disturbance: Secondary | ICD-10-CM | POA: Diagnosis not present

## 2022-08-19 DIAGNOSIS — N183 Chronic kidney disease, stage 3 unspecified: Secondary | ICD-10-CM | POA: Diagnosis not present

## 2022-08-19 NOTE — Telephone Encounter (Signed)
Spoke with patients brother to clarify patients status. Patient was transferred to SNF. Patients brother aware, once she is discharged or close to being discharged to give [place] a call for a follow up appointment.

## 2022-08-30 DIAGNOSIS — I129 Hypertensive chronic kidney disease with stage 1 through stage 4 chronic kidney disease, or unspecified chronic kidney disease: Secondary | ICD-10-CM | POA: Diagnosis not present

## 2022-08-30 DIAGNOSIS — E039 Hypothyroidism, unspecified: Secondary | ICD-10-CM | POA: Diagnosis not present

## 2022-08-30 DIAGNOSIS — D472 Monoclonal gammopathy: Secondary | ICD-10-CM | POA: Diagnosis not present

## 2022-08-30 DIAGNOSIS — F0390 Unspecified dementia without behavioral disturbance: Secondary | ICD-10-CM | POA: Diagnosis not present

## 2022-08-30 DIAGNOSIS — N183 Chronic kidney disease, stage 3 unspecified: Secondary | ICD-10-CM | POA: Diagnosis not present

## 2022-09-02 ENCOUNTER — Telehealth: Payer: Self-pay

## 2022-09-02 NOTE — Telephone Encounter (Signed)
Transition Care Management Follow-up Telephone Call Date of discharge and from where: 08/16/2022 [place]  How have you been since you were released from the hospital? Pt son states she is doing okay. She is being discharged today from [facility].  Any questions or concerns? No  Items Reviewed: Did the pt receive and understand the discharge instructions provided? Yes  Medications obtained and verified? Yes  Other? Yes  Any new allergies since your discharge? No  Dietary orders reviewed? Yes Do you have support at home? Yes   Home Care and Equipment/Supplies: Were home health services ordered? no If so, what is the name of the agency? N/a  Has the agency set up a time to come to the patient's home? no Were any new equipment or medical supplies ordered?  No What is the name of the medical supply agency? N/a Were you able to get the supplies/equipment? no Do you have any questions related to the use of the equipment or supplies? No  Functional Questionnaire: (I = Independent and D = Dependent) ADLs: I/d  Bathing/Dressing- i  Meal Prep- i  Eating- i  Maintaining continence- I/d  Transferring/Ambulation- i  Managing Meds- d  Follow up appointments reviewed:  PCP Hospital f/u appt confirmed? Yes  Scheduled to see robyn sanders on 09/14/2022  @ [address]. [facility] f/u appt confirmed? No  Scheduled to see n/a on n/a @ n/a. Are transportation arrangements needed? No  If their condition worsens, is the pt aware to call PCP or go to the Emergency Dept.? Yes Was the patient provided with contact information for the PCP's office or ED? Yes Was to pt encouraged to call back with questions or concerns? Yes

## 2022-09-06 DIAGNOSIS — D649 Anemia, unspecified: Secondary | ICD-10-CM | POA: Diagnosis not present

## 2022-09-06 DIAGNOSIS — F0390 Unspecified dementia without behavioral disturbance: Secondary | ICD-10-CM | POA: Diagnosis not present

## 2022-09-06 DIAGNOSIS — N390 Urinary tract infection, site not specified: Secondary | ICD-10-CM | POA: Diagnosis not present

## 2022-09-06 DIAGNOSIS — E039 Hypothyroidism, unspecified: Secondary | ICD-10-CM | POA: Diagnosis not present

## 2022-09-14 ENCOUNTER — Encounter: Payer: Medicare HMO | Admitting: Internal Medicine

## 2022-09-14 NOTE — Progress Notes (Deleted)
  Barnet Glasgow Yamilet Mcfayden,acting as a Education administrator for Maximino Greenland, MD.,have documented all relevant documentation on the behalf of Maximino Greenland, MD,as directed by  Maximino Greenland, MD while in the presence of Maximino Greenland, MD.    Subjective:     Patient ID: Brianna [name] , female    DOB: 01-06-1933 , 88 y.o.   MRN: [id number]   No chief complaint on file.   HPI  Patient presents today for a hospital follow up patient was admitted to hospital on 08/11/2022 patient had server sepsis.      Past Medical History:  Diagnosis Date   Headache 12/24/2013   Headache(784.0)    Hypertension    pcp  [facility]   Hypothyroid    MGUS (monoclonal gammopathy of unknown significance) 12/10/2013     Family History  Problem Relation Age of Onset   Cancer Maternal Grandfather        stomach cancer   Cancer Paternal Grandfather        stomach cancer   Healthy Mother    Dementia Mother        at 10-99 y.o   Healthy Father      Current Outpatient Medications:    aspirin 81 MG tablet, Take 81 mg by mouth daily., Disp: , Rfl:    Cholecalciferol (VITAMIN D3) 125 MCG (5000 UT) CAPS, Take 5,000 Units by mouth daily., Disp: , Rfl:    feeding supplement (ENSURE ENLIVE / ENSURE PLUS) LIQD, Take 237 mLs by mouth 2 (two) times daily between meals., Disp: 237 mL, Rfl: 12   levothyroxine (SYNTHROID) 125 MCG tablet, Take 1 tablet (125 mcg total) by mouth daily at 6 (six) AM., Disp: , Rfl:    Melatonin 5 MG TABS, Take 1 tablet by mouth at bedtime as needed., Disp: , Rfl:    Multiple Vitamin (MULTIVITAMIN WITH MINERALS) TABS, Take 1 tablet by mouth daily., Disp: , Rfl:    Allergies  Allergen Reactions   Iodine Other (See Comments)    Reaction unknown   Sulfa Antibiotics Other (See Comments)    Childhood reaction     Review of Systems   There were no vitals filed for this visit. There is no height or weight on file to calculate BMI.   Objective:  Physical Exam      Assessment And Plan:      There are no diagnoses linked to this encounter.    Patient was given opportunity to ask questions. Patient verbalized understanding of the plan and was able to repeat key elements of the plan. All questions were answered to their satisfaction.  Tonie Griffith, [place], Tonie Griffith, CMA, have reviewed all documentation for this visit. The documentation on 09/14/22 for the exam, diagnosis, procedures, and orders are all accurate and complete.   IF YOU HAVE BEEN REFERRED TO A SPECIALIST, IT MAY TAKE 1-2 WEEKS TO SCHEDULE/PROCESS THE REFERRAL. IF YOU HAVE NOT HEARD FROM US/SPECIALIST IN TWO WEEKS, PLEASE GIVE [place] A CALL AT [phone] X [phone].   THE PATIENT IS ENCOURAGED TO PRACTICE SOCIAL DISTANCING DUE TO THE COVID-19 PANDEMIC.

## 2022-09-18 NOTE — Progress Notes (Signed)
No show

## 2022-10-03 DIAGNOSIS — N390 Urinary tract infection, site not specified: Secondary | ICD-10-CM | POA: Diagnosis not present

## 2022-10-03 DIAGNOSIS — R4182 Altered mental status, unspecified: Secondary | ICD-10-CM | POA: Diagnosis not present

## 2022-10-09 DIAGNOSIS — D649 Anemia, unspecified: Secondary | ICD-10-CM | POA: Diagnosis not present

## 2022-10-09 DIAGNOSIS — I10 Essential (primary) hypertension: Secondary | ICD-10-CM | POA: Diagnosis not present

## 2022-10-25 NOTE — Telephone Encounter (Signed)
Chmg-error.  

## 2022-11-13 DIAGNOSIS — E039 Hypothyroidism, unspecified: Secondary | ICD-10-CM | POA: Diagnosis not present

## 2022-11-13 DIAGNOSIS — R600 Localized edema: Secondary | ICD-10-CM | POA: Diagnosis not present

## 2022-11-13 DIAGNOSIS — D649 Anemia, unspecified: Secondary | ICD-10-CM | POA: Diagnosis not present

## 2022-11-13 DIAGNOSIS — I10 Essential (primary) hypertension: Secondary | ICD-10-CM | POA: Diagnosis not present

## 2022-11-17 ENCOUNTER — Ambulatory Visit: Payer: Medicare HMO

## 2022-11-24 DIAGNOSIS — H5213 Myopia, bilateral: Secondary | ICD-10-CM | POA: Diagnosis not present

## 2022-11-24 DIAGNOSIS — H353131 Nonexudative age-related macular degeneration, bilateral, early dry stage: Secondary | ICD-10-CM | POA: Diagnosis not present

## 2022-11-24 DIAGNOSIS — H52223 Regular astigmatism, bilateral: Secondary | ICD-10-CM | POA: Diagnosis not present

## 2022-11-24 DIAGNOSIS — Z961 Presence of intraocular lens: Secondary | ICD-10-CM | POA: Diagnosis not present

## 2022-11-24 DIAGNOSIS — H524 Presbyopia: Secondary | ICD-10-CM | POA: Diagnosis not present

## 2022-12-07 DIAGNOSIS — F419 Anxiety disorder, unspecified: Secondary | ICD-10-CM | POA: Diagnosis not present

## 2022-12-07 DIAGNOSIS — I10 Essential (primary) hypertension: Secondary | ICD-10-CM | POA: Diagnosis not present

## 2022-12-08 DIAGNOSIS — E039 Hypothyroidism, unspecified: Secondary | ICD-10-CM | POA: Diagnosis not present

## 2022-12-08 DIAGNOSIS — E559 Vitamin D deficiency, unspecified: Secondary | ICD-10-CM | POA: Diagnosis not present

## 2022-12-08 DIAGNOSIS — B029 Zoster without complications: Secondary | ICD-10-CM | POA: Diagnosis not present

## 2022-12-08 DIAGNOSIS — F0280 Dementia in other diseases classified elsewhere without behavioral disturbance: Secondary | ICD-10-CM | POA: Diagnosis not present

## 2022-12-08 DIAGNOSIS — F5109 Other insomnia not due to a substance or known physiological condition: Secondary | ICD-10-CM | POA: Diagnosis not present

## 2022-12-08 DIAGNOSIS — I10 Essential (primary) hypertension: Secondary | ICD-10-CM | POA: Diagnosis not present

## 2022-12-13 DIAGNOSIS — E559 Vitamin D deficiency, unspecified: Secondary | ICD-10-CM | POA: Diagnosis not present

## 2022-12-13 DIAGNOSIS — E119 Type 2 diabetes mellitus without complications: Secondary | ICD-10-CM | POA: Diagnosis not present

## 2022-12-13 DIAGNOSIS — E039 Hypothyroidism, unspecified: Secondary | ICD-10-CM | POA: Diagnosis not present

## 2022-12-13 DIAGNOSIS — I10 Essential (primary) hypertension: Secondary | ICD-10-CM | POA: Diagnosis not present

## 2022-12-29 ENCOUNTER — Encounter: Payer: Medicare HMO | Admitting: Internal Medicine

## 2023-01-18 ENCOUNTER — Ambulatory Visit: Payer: Medicare HMO | Admitting: Internal Medicine
